# Patient Record
Sex: Male | Born: 1950
Health system: Southern US, Community
[De-identification: ages and names within clinical notes are randomized; demographics above are authoritative.]

## PROBLEM LIST (undated history)

## (undated) DIAGNOSIS — M1990 Unspecified osteoarthritis, unspecified site: Secondary | ICD-10-CM

## (undated) DIAGNOSIS — J45909 Unspecified asthma, uncomplicated: Secondary | ICD-10-CM

## (undated) DIAGNOSIS — Z8489 Family history of other specified conditions: Secondary | ICD-10-CM

## (undated) DIAGNOSIS — H269 Unspecified cataract: Secondary | ICD-10-CM

## (undated) DIAGNOSIS — Z86010 Personal history of colonic polyps: Secondary | ICD-10-CM

## (undated) DIAGNOSIS — Z9289 Personal history of other medical treatment: Secondary | ICD-10-CM

## (undated) DIAGNOSIS — S329XXA Fracture of unspecified parts of lumbosacral spine and pelvis, initial encounter for closed fracture: Secondary | ICD-10-CM

## (undated) DIAGNOSIS — M1612 Unilateral primary osteoarthritis, left hip: Secondary | ICD-10-CM

## (undated) DIAGNOSIS — H409 Unspecified glaucoma: Secondary | ICD-10-CM

## (undated) DIAGNOSIS — J189 Pneumonia, unspecified organism: Secondary | ICD-10-CM

## (undated) HISTORY — DX: Unspecified osteoarthritis, unspecified site: M19.90

## (undated) HISTORY — DX: Unspecified glaucoma: H40.9

## (undated) HISTORY — DX: Unspecified asthma, uncomplicated: J45.909

## (undated) HISTORY — PX: JOINT REPLACEMENT: SHX530

## (undated) HISTORY — DX: Unspecified cataract: H26.9

## (undated) HISTORY — DX: Personal history of colonic polyps: Z86.010

---
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
## 1993-12-29 DIAGNOSIS — Z9289 Personal history of other medical treatment: Secondary | ICD-10-CM

## 1993-12-29 HISTORY — PX: FRACTURE SURGERY: SHX138

## 1993-12-29 HISTORY — DX: Personal history of other medical treatment: Z92.89

## 1993-12-29 HISTORY — PX: PELVIC FRACTURE SURGERY: SHX119

## 1994-09-28 DIAGNOSIS — S329XXA Fracture of unspecified parts of lumbosacral spine and pelvis, initial encounter for closed fracture: Secondary | ICD-10-CM

## 1994-09-28 HISTORY — DX: Fracture of unspecified parts of lumbosacral spine and pelvis, initial encounter for closed fracture: S32.9XXA

## 1998-06-01 ENCOUNTER — Encounter: Admission: RE | Admit: 1998-06-01 | Discharge: 1998-06-01 | Payer: Self-pay | Admitting: *Deleted

## 1999-02-12 ENCOUNTER — Encounter: Payer: Self-pay | Admitting: Emergency Medicine

## 1999-02-12 ENCOUNTER — Emergency Department (HOSPITAL_COMMUNITY): Admission: EM | Admit: 1999-02-12 | Discharge: 1999-02-12 | Payer: Self-pay | Admitting: Emergency Medicine

## 1999-05-23 ENCOUNTER — Emergency Department (HOSPITAL_COMMUNITY): Admission: EM | Admit: 1999-05-23 | Discharge: 1999-05-23 | Payer: Self-pay | Admitting: Emergency Medicine

## 1999-05-23 ENCOUNTER — Encounter: Payer: Self-pay | Admitting: Emergency Medicine

## 2000-02-23 ENCOUNTER — Encounter: Payer: Self-pay | Admitting: Emergency Medicine

## 2000-02-23 ENCOUNTER — Emergency Department (HOSPITAL_COMMUNITY): Admission: EM | Admit: 2000-02-23 | Discharge: 2000-02-23 | Payer: Self-pay | Admitting: Emergency Medicine

## 2000-03-13 ENCOUNTER — Encounter: Admission: RE | Admit: 2000-03-13 | Discharge: 2000-04-16 | Payer: Self-pay | Admitting: *Deleted

## 2004-12-29 HISTORY — PX: HERNIA REPAIR: SHX51

## 2004-12-29 HISTORY — PX: OTHER SURGICAL HISTORY: SHX169

## 2012-01-06 ENCOUNTER — Ambulatory Visit (INDEPENDENT_AMBULATORY_CARE_PROVIDER_SITE_OTHER): Payer: Federal, State, Local not specified - PPO

## 2012-01-06 DIAGNOSIS — N529 Male erectile dysfunction, unspecified: Secondary | ICD-10-CM

## 2012-01-06 DIAGNOSIS — J45901 Unspecified asthma with (acute) exacerbation: Secondary | ICD-10-CM

## 2012-01-06 DIAGNOSIS — M25559 Pain in unspecified hip: Secondary | ICD-10-CM

## 2012-01-06 DIAGNOSIS — M1610 Unilateral primary osteoarthritis, unspecified hip: Secondary | ICD-10-CM

## 2012-02-03 ENCOUNTER — Ambulatory Visit (INDEPENDENT_AMBULATORY_CARE_PROVIDER_SITE_OTHER): Payer: Federal, State, Local not specified - PPO | Admitting: Family Medicine

## 2012-02-03 VITALS — BP 112/72 | HR 68 | Temp 98.4°F | Resp 16 | Ht 72.0 in | Wt 180.0 lb

## 2012-02-03 DIAGNOSIS — J40 Bronchitis, not specified as acute or chronic: Secondary | ICD-10-CM

## 2012-02-03 MED ORDER — PREDNISONE 20 MG PO TABS: ORAL_TABLET | ORAL | Status: AC

## 2012-02-03 MED ORDER — AZITHROMYCIN 250 MG PO TABS: ORAL_TABLET | ORAL | Status: AC

## 2012-02-03 NOTE — Progress Notes (Signed)
  Subjective:    Patient ID: Douglas Russell, male    DOB: [birth year], 61 y.o.   MRN: [id number]  HPI 61 yo male here with URI complaints.  "Flu" January 20-25th.  Still has cough, sore throat, subjective fever off and on.  Cough occasionally productive of yellowish mucus.  No nasal congestion or ear pain.  Slight runny nose.  Using dayquil/nyquil for symptoms.  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    Review of Systems Negative except as per HPI     Objective:   Physical Exam  Constitutional: He appears well-developed. No distress.  HENT:  Right Ear: Tympanic membrane, external ear and ear canal normal. Tympanic membrane is not injected, not scarred, not perforated, not erythematous, not retracted and not bulging.  Left Ear: Tympanic membrane, external ear and ear canal normal. Tympanic membrane is not injected, not scarred, not perforated, not erythematous, not retracted and not bulging.  Nose: No mucosal edema or rhinorrhea. Right sinus exhibits no maxillary sinus tenderness and no frontal sinus tenderness. Left sinus exhibits no maxillary sinus tenderness and no frontal sinus tenderness.  Mouth/Throat: Uvula is midline, oropharynx is clear and moist and mucous membranes are normal. No oropharyngeal exudate or tonsillar abscesses.  Cardiovascular: Normal rate, regular rhythm, normal heart sounds and intact distal pulses.   No murmur heard. Pulmonary/Chest: Effort normal. No respiratory distress. He has wheezes. He has no rales.  Lymphadenopathy:       Head (right side): No submandibular and no preauricular adenopathy present.       Head (left side): No submandibular and no preauricular adenopathy present.       Right cervical: No superficial cervical and no posterior cervical adenopathy present.      Left cervical: No superficial cervical and no posterior cervical adenopathy present.       Right: No supraclavicular adenopathy present.       Left: No supraclavicular adenopathy  present.  Skin: Skin is warm and dry.          Assessment & Plan:  Post viral bronchitis Zpak, pred taper

## 2012-03-16 ENCOUNTER — Encounter (HOSPITAL_COMMUNITY): Payer: Self-pay | Admitting: Emergency Medicine

## 2012-03-16 ENCOUNTER — Emergency Department (HOSPITAL_COMMUNITY)
Admission: EM | Admit: 2012-03-16 | Discharge: 2012-03-16 | Disposition: A | Discharge status: Home / Self Care | Payer: Federal, State, Local not specified - PPO | Attending: Emergency Medicine | Admitting: Emergency Medicine

## 2012-03-16 ENCOUNTER — Emergency Department (HOSPITAL_COMMUNITY): Payer: Federal, State, Local not specified - PPO

## 2012-03-16 ENCOUNTER — Other Ambulatory Visit: Payer: Self-pay

## 2012-03-16 DIAGNOSIS — R0789 Other chest pain: Secondary | ICD-10-CM | POA: Insufficient documentation

## 2012-03-16 DIAGNOSIS — J45909 Unspecified asthma, uncomplicated: Secondary | ICD-10-CM | POA: Insufficient documentation

## 2012-03-16 LAB — CBC
Hemoglobin: 14.7 g/dL (ref 13.0–17.0)
MCHC: 34.6 g/dL (ref 30.0–36.0)
RBC: 4.9 MIL/uL (ref 4.22–5.81)

## 2012-03-16 LAB — POCT I-STAT TROPONIN I
Troponin i, poc: 0 ng/mL (ref 0.00–0.08)
Troponin i, poc: 0 ng/mL (ref 0.00–0.08)

## 2012-03-16 LAB — BASIC METABOLIC PANEL
GFR calc non Af Amer: 89 mL/min — ABNORMAL LOW (ref >90)
Glucose, Bld: 94 mg/dL (ref 70–99)
Potassium: 4.1 mEq/L (ref 3.5–5.1)
Sodium: 137 mEq/L (ref 135–145)

## 2012-03-16 MED ORDER — OXYCODONE-ACETAMINOPHEN 5-325 MG PO TABS: 1.0000 | ORAL_TABLET | Freq: Four times a day (QID) | ORAL | Status: AC | PRN

## 2012-03-16 MED ORDER — OXYCODONE-ACETAMINOPHEN 5-325 MG PO TABS
1.0000 | ORAL_TABLET | Freq: Once | ORAL | Status: AC
  Administered 2012-03-16: 1 via ORAL
  Filled 2012-03-16: qty 1

## 2012-03-16 MED ORDER — IBUPROFEN 600 MG PO TABS: 600.0000 mg | ORAL_TABLET | Freq: Four times a day (QID) | ORAL | Status: AC | PRN

## 2012-03-16 NOTE — ED Notes (Signed)
Pt c/o (L) side chest soreness, tender to touch, pt states "I think it's just the muscles in my chest and Dr. Karma Ganja feels like it's the muscles in my chest. Pt reports increase pain w/chest expansion or arm movements. Pt reports the soreness started after am work out on [employer]. 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

## 2012-03-16 NOTE — ED Provider Notes (Signed)
History     CSN: [id number]  Arrival date & time 03/16/12  1622   First MD Initiated Contact with Patient 03/16/12 2026      Chief Complaint  Patient presents with  . Chest Pain    (Consider location/radiation/quality/duration/timing/severity/associated sxs/prior treatment) HPI Patient presents with complaint of pain in his left anterior chest wall. He states that his pain began earlier today. It is worse with movement of his arm and with turning his head left and right. He states that he does abdominal crunches and Boflex exercises everyday but has not changed his regimen. He denies any shortness of breath no diaphoresis no nausea no radiation of the pain. He's had no fever or cough. His pain is not worsened with exertion. It is only present with movement and palpation.  He has not taken any medications for her symptoms prior to ED evaluation.  There are no other associated systemic symptoms, there are no alleviating or modifying factors.   Past Medical History  Diagnosis Date  . Asthma     Past Surgical History  Procedure Date  . Pelvic fracture surgery 1995  . Nose surgery     No family history on file.  History  Substance Use Topics  . Smoking status: Never Smoker   . Smokeless tobacco: Not on file  . Alcohol Use: Yes     occasional glass of wine      Review of Systems ROS reviewed and otherwise negative except for mentioned in HPI  Allergies  Review of patient's allergies indicates no known allergies.  Home Medications   Current Outpatient Rx  Name Route Sig Dispense Refill  . ASPIRIN EC 81 MG PO TBEC Oral Take 81 mg by mouth daily.    . OMEGA-3 FATTY ACIDS 1000 MG PO CAPS Oral Take 1 g by mouth daily.    Marland Kitchen FLUTICASONE-SALMETEROL 250-50 MCG/DOSE IN AEPB Inhalation Inhale 1 puff into the lungs every 12 (twelve) hours.    . ADULT MULTIVITAMIN W/MINERALS CH Oral Take 1 tablet by mouth daily.    . SAW PALMETTO (SERENOA REPENS) 1000 MG PO CAPS Oral Take 1  capsule by mouth.    Marland Kitchen SILDENAFIL CITRATE 25 MG PO TABS Oral Take 25 mg by mouth daily as needed. For erectile dysfunction    . TRAVOPROST (BAK FREE) 0.004 % OP SOLN Both Eyes Place 1 drop into both eyes at bedtime.     Marland Kitchen ZOLPIDEM TARTRATE ER 12.5 MG PO TBCR Oral Take 12.5 mg by mouth at bedtime as needed. For sleep    . IBUPROFEN 600 MG PO TABS Oral Take 1 tablet (600 mg total) by mouth every 6 (six) hours as needed for pain. 30 tablet 0  . OXYCODONE-ACETAMINOPHEN 5-325 MG PO TABS Oral Take 1-2 tablets by mouth every 6 (six) hours as needed for pain. 15 tablet 0    BP 123/70  Pulse 56  Temp(Src) 97.9 F (36.6 C) (Oral)  Resp 14  SpO2 99% Vitals reviewed Physical Exam Physical Examination: General appearance - alert, well appearing, and in no distress Mental status - alert, oriented to person, place, and time Mouth - mucous membranes moist, pharynx normal without lesions Neck- no midline tenderness to palpation of c/t/l spine Chest - clear to auscultation, no wheezes, rales or rhonchi, symmetric air entry, ttp over left anterior chest wall- worse at sternal junction Heart - normal rate, regular rhythm, normal S1, S2, no murmurs, rubs, clicks or gallops Abdomen - soft, nontender, nondistended, no masses or  organomegaly Musculoskeletal - no joint tenderness, deformity or swelling, FROM of shoulder Extremities - peripheral pulses normal, no pedal edema, no clubbing or cyanosis Skin - normal coloration and turgor, no rashes  ED Course  Procedures (including critical care time)  Labs Reviewed  BASIC METABOLIC PANEL - Abnormal; Notable for the following:    GFR calc non Af Amer 89 (*)    All other components within normal limits  CBC  POCT I-STAT TROPONIN I  POCT I-STAT TROPONIN I   Dg Chest 2 View  03/16/2012  *RADIOLOGY REPORT*  Clinical Data:  chest pain  CHEST - 2 VIEW  Comparison: None  Findings: The heart size and mediastinal contours are within normal limits.  Both lungs are  clear.  The visualized skeletal structures are unremarkable.  IMPRESSION: Negative exam.  Original Report Authenticated By: Rosealee Albee, M.D.     1. Chest wall pain       MDM  Patient presents with left-sided chest pain that is only present with movement of his arm and palpation of his chest wall. The pain is reproducible upon palpation during my evaluation. He has no risk factors and low suspicion for acute coronary syndrome especially given 2 troponins that were negative today in the emergency department. Patient was advised to take anti-inflammatories and was given strict return precautions. He is agreeable with this plan and will arrange for followup with his primary care Dr.        Ethelda Chick, MD 03/16/12 2322

## 2012-03-16 NOTE — ED Notes (Signed)
Pt st's earlier today he started having pain in left chest and under left arm.  St's only had pain when he would turn his body or head.  Pt has been using BoFlex exercises.  Denies any other symptoms

## 2012-03-16 NOTE — Discharge Instructions (Signed)
Return to the ED with any concerns including difficulty breathing, worsening pain, nausea or sweating associated with your pain, swelling of your legs, fainting, or any other alarming symptoms.

## 2012-09-28 ENCOUNTER — Ambulatory Visit (INDEPENDENT_AMBULATORY_CARE_PROVIDER_SITE_OTHER): Payer: Federal, State, Local not specified - PPO

## 2012-09-28 DIAGNOSIS — Z23 Encounter for immunization: Secondary | ICD-10-CM

## 2012-09-28 NOTE — Addendum Note (Signed)
Addended by: Cydney Ok on: 09/28/2012 04:45 PM   Modules accepted: Level of Service

## 2012-10-14 ENCOUNTER — Other Ambulatory Visit: Payer: Self-pay | Admitting: Physician Assistant

## 2012-10-18 ENCOUNTER — Telehealth: Payer: Self-pay

## 2012-10-18 MED ORDER — FLUTICASONE-SALMETEROL 250-50 MCG/DOSE IN AEPB: 1.0000 | INHALATION_SPRAY | Freq: Two times a day (BID) | RESPIRATORY_TRACT | Status: DC

## 2012-10-18 NOTE — Telephone Encounter (Signed)
He uses [facility]. He now states he does not need the Albuterol inhaler. He needs the Advair, discussed with Benny Lennert and okay to send in.  Sent in error to [facility] this sent to mail order.

## 2012-10-18 NOTE — Telephone Encounter (Signed)
Pt would like to talk to someone regarding his mail order rx best number to call is [phone]

## 2012-10-18 NOTE — Telephone Encounter (Signed)
I have pended the Rx - I did not know what mail order he wanted - he is still taking his advair correct?

## 2012-10-18 NOTE — Telephone Encounter (Signed)
I called patient, he states the mail order pharmacy has ? About his albuterol inhaler, the dosage. I asked why he is just now getting the Rx, (he states was written in Feb) he states his previous Rx has just now ran out, and he would like renewal, he is advised he may need to come back to clinic for this, he states he is well, and needs the inhaler only, would like this sent in without visit. Please advise. I will pull chart. [id number]

## 2012-10-21 ENCOUNTER — Telehealth: Payer: Self-pay | Admitting: *Deleted

## 2012-10-21 MED ORDER — FLUTICASONE-SALMETEROL 250-50 MCG/DOSE IN AEPB: 1.0000 | INHALATION_SPRAY | Freq: Two times a day (BID) | RESPIRATORY_TRACT | Status: DC

## 2012-10-21 NOTE — Telephone Encounter (Signed)
No notes

## 2013-01-11 ENCOUNTER — Ambulatory Visit (INDEPENDENT_AMBULATORY_CARE_PROVIDER_SITE_OTHER): Payer: Federal, State, Local not specified - PPO | Admitting: Family Medicine

## 2013-01-11 VITALS — BP 113/73 | HR 71 | Temp 98.0°F | Resp 16 | Ht 71.25 in | Wt 184.0 lb

## 2013-01-11 DIAGNOSIS — Z125 Encounter for screening for malignant neoplasm of prostate: Secondary | ICD-10-CM

## 2013-01-11 DIAGNOSIS — J449 Chronic obstructive pulmonary disease, unspecified: Secondary | ICD-10-CM

## 2013-01-11 DIAGNOSIS — N529 Male erectile dysfunction, unspecified: Secondary | ICD-10-CM

## 2013-01-11 DIAGNOSIS — Z202 Contact with and (suspected) exposure to infections with a predominantly sexual mode of transmission: Secondary | ICD-10-CM

## 2013-01-11 DIAGNOSIS — Z2089 Contact with and (suspected) exposure to other communicable diseases: Secondary | ICD-10-CM

## 2013-01-11 DIAGNOSIS — L219 Seborrheic dermatitis, unspecified: Secondary | ICD-10-CM

## 2013-01-11 MED ORDER — FLUTICASONE-SALMETEROL 250-50 MCG/DOSE IN AEPB: 1.0000 | INHALATION_SPRAY | Freq: Two times a day (BID) | RESPIRATORY_TRACT | Status: DC

## 2013-01-11 MED ORDER — SILDENAFIL CITRATE 25 MG PO TABS: 50.0000 mg | ORAL_TABLET | Freq: Every day | ORAL | Status: DC | PRN

## 2013-01-11 MED ORDER — FLUOCINOLONE ACETONIDE 0.01 % EX SOLN: Freq: Two times a day (BID) | CUTANEOUS | Status: DC

## 2013-01-11 NOTE — Patient Instructions (Signed)
Return in 1 year or as needed 

## 2013-01-11 NOTE — Progress Notes (Signed)
Subjective: Patient is here for his regular annual prostate check. He also wants to be STD tested. His girlfriend requests that. 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 He has no major problems or complaints. He feels well. Review of systems including HEENT, respiratory, cardiovascular, GI, GU, musculoskeletal all unremarkable. He does have some his COPD but he uses breathing medicine and is exercising on exercise equipment regularly. He just got a new treadmill.  Objective: No complaints digital rectal exam was done and was normal prostate gland is very small. Normal male external genitalia with no lesions noted testes descended no hernias.  Assessment: PSA screen COPD Seborrheic dermatitis STD exposure risk  Plan: Prescribe his medications. Daily STD testing and PSA done before he leaves.

## 2013-01-13 LAB — HSV(HERPES SIMPLEX VRS) I + II AB-IGG
HSV 1 Glycoprotein G Ab, IgG: 16.14 IV — ABNORMAL HIGH
HSV 2 Glycoprotein G Ab, IgG: 4.85 IV — ABNORMAL HIGH

## 2013-01-13 LAB — GC/CHLAMYDIA PROBE AMP, URINE: Chlamydia, Swab/Urine, PCR: NEGATIVE

## 2013-01-17 ENCOUNTER — Other Ambulatory Visit: Payer: Self-pay | Admitting: *Deleted

## 2013-01-17 DIAGNOSIS — L219 Seborrheic dermatitis, unspecified: Secondary | ICD-10-CM

## 2013-01-17 MED ORDER — FLUOCINOLONE ACETONIDE 0.01 % EX SOLN: Freq: Two times a day (BID) | CUTANEOUS | Status: DC

## 2013-06-16 ENCOUNTER — Ambulatory Visit (INDEPENDENT_AMBULATORY_CARE_PROVIDER_SITE_OTHER): Payer: Federal, State, Local not specified - PPO | Admitting: Family Medicine

## 2013-06-16 VITALS — BP 118/76 | HR 86 | Temp 98.0°F | Resp 16 | Ht 72.0 in | Wt 186.0 lb

## 2013-06-16 DIAGNOSIS — N400 Enlarged prostate without lower urinary tract symptoms: Secondary | ICD-10-CM

## 2013-06-16 DIAGNOSIS — F411 Generalized anxiety disorder: Secondary | ICD-10-CM

## 2013-06-16 DIAGNOSIS — G8929 Other chronic pain: Secondary | ICD-10-CM

## 2013-06-16 MED ORDER — CLONAZEPAM 0.5 MG PO TABS: 0.5000 mg | ORAL_TABLET | Freq: Two times a day (BID) | ORAL | Status: DC | PRN

## 2013-06-16 MED ORDER — SERTRALINE HCL 50 MG PO TABS: 50.0000 mg | ORAL_TABLET | Freq: Every day | ORAL | Status: DC

## 2013-06-16 NOTE — Patient Instructions (Signed)
Continue seeing your psychologist.  Return in 3 weeks for followup. Call the office to find out what Dr. Alwyn Ren are at the time.  Begin sertraline 50 mg one each morning.  Take clonazepam one daily only if extremely anxious.
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
## 2013-06-24 ENCOUNTER — Telehealth: Payer: Self-pay

## 2013-06-24 DIAGNOSIS — F411 Generalized anxiety disorder: Secondary | ICD-10-CM

## 2013-06-24 MED ORDER — SERTRALINE HCL 50 MG PO TABS: 50.0000 mg | ORAL_TABLET | Freq: Every day | ORAL | Status: DC

## 2013-06-24 NOTE — Telephone Encounter (Signed)
[facility] states [facility] needs additional information to fill his zoloft request   [phone]

## 2013-06-24 NOTE — Telephone Encounter (Signed)
Sent through for 90 day supply this is what is needed.

## 2013-07-08 ENCOUNTER — Telehealth: Payer: Self-pay | Admitting: Radiology

## 2013-07-08 ENCOUNTER — Ambulatory Visit (INDEPENDENT_AMBULATORY_CARE_PROVIDER_SITE_OTHER): Payer: Federal, State, Local not specified - PPO | Admitting: Family Medicine

## 2013-07-08 VITALS — BP 120/76 | HR 68 | Temp 97.7°F | Resp 16 | Ht 72.5 in | Wt 184.0 lb

## 2013-07-08 DIAGNOSIS — B009 Herpesviral infection, unspecified: Secondary | ICD-10-CM

## 2013-07-08 DIAGNOSIS — F411 Generalized anxiety disorder: Secondary | ICD-10-CM

## 2013-07-08 DIAGNOSIS — G4700 Insomnia, unspecified: Secondary | ICD-10-CM

## 2013-07-08 MED ORDER — VALACYCLOVIR HCL 1 G PO TABS: ORAL_TABLET | ORAL | Status: DC

## 2013-07-08 NOTE — Telephone Encounter (Signed)
I did not give long term rx.  Tell him to read the AVS which explains the prn use if he has an attack.  We discussed this.

## 2013-07-08 NOTE — Progress Notes (Signed)
Subjective: Patient is here complaining of not being able to sleep. He periodically sees a sleep specialist in [place]. He has had a couple sleep studies done. They apparently have him on Rozerem and Ambien. He is very anxious. The HSV 1 and HSV-2 make him very anxious. He is scared to take the medications I prescribed, sertraline and clonazepam, for fear of him a side effect. Good long discussion about that. He does try and get exercise. He drinks some green tea in the morning.  Objective: Did not examine him today  Assessment: Anxiety Sleep disturbance History of HSV 1 and HSV-2  Plan: Valtrex for when necessary use. Continue same medications. Referred to a psychiatrist for the anxiety. See a sleep study specialist back to discuss anything as it can be done for her sleep. Had long discussion with him.

## 2013-07-08 NOTE — Patient Instructions (Signed)
See your sleep Dr. back and asked them if there is not some other option for you.  We are making referral to a psychiatrist for you. They may help you sleep some also.  Use the valacyclovir when needed to 4 outbreaks of HSV 1 or HSV-2  Continue to try and get regular exercise  Give the sertraline a little bit more time to start easing your symptoms. Save the clonazepam for days in your just extremely anxious.  Return in September for recheck, sooner if problems

## 2013-07-08 NOTE — Telephone Encounter (Signed)
Patient needs 60 day Rx for mail order do you want to change quantity on the Valtrex? Or do you expect #20 will be sufficient for 60 days?

## 2013-07-09 NOTE — Telephone Encounter (Signed)
Patient is wanting to cancel his referral to psychiatrist and try the medicine you gave him.

## 2013-07-11 NOTE — Telephone Encounter (Signed)
Noted, thanks. Patient advised he should see Psychiatrist, this will not be a long term medication. Douglas Russell

## 2013-07-21 ENCOUNTER — Telehealth: Payer: Self-pay

## 2013-07-21 DIAGNOSIS — B009 Herpesviral infection, unspecified: Secondary | ICD-10-CM

## 2013-07-21 NOTE — Telephone Encounter (Signed)
Ok to change to 90 day supply

## 2013-07-21 NOTE — Telephone Encounter (Signed)
Please advise in increased quantity on the Valtrex.

## 2013-07-21 NOTE — Telephone Encounter (Signed)
Patient takes prn flare depending on if he has flare of type one or type two, how many do you think he may need for the 90 day supply? This sig is a bit different, I pended for #60 instead of #20, do you think this is adequate?

## 2013-07-21 NOTE — Telephone Encounter (Signed)
Pt is calling because Dr Alwyn Ren wrote him a prescription for valtrax and his insurance will only pay for 90 day supply  But dr hopper only wrote it for less than 60. The pt was wanting to know if it could be changed He gave me a reference number for the insurance [id number] Call back number is [phone]

## 2013-07-22 ENCOUNTER — Telehealth: Payer: Self-pay | Admitting: Radiology

## 2013-07-22 MED ORDER — VALACYCLOVIR HCL 500 MG PO TABS: 500.0000 mg | ORAL_TABLET | Freq: Two times a day (BID) | ORAL | Status: DC

## 2013-07-22 NOTE — Telephone Encounter (Signed)
I called and LMOM for patient to return call.  I would like to talk to him regarding how he would like to take the medication before I send it to the pharmacy.  Would he like to take daily or prn is what I need to do.

## 2013-07-22 NOTE — Telephone Encounter (Signed)
Pt had 1 outbreak in May -- he would like to take meds as needed for outbreaks.  He will call if he changes his mind and wants to take daily.

## 2013-07-22 NOTE — Telephone Encounter (Signed)
I spoke with pt and advised him that he will that may want to take for suppression daily because he will possibly have less out breakouts to none.  His outbreaks occurs in the genital area.  He also stated that if his rx is written for less than a 90 day supply it will charge him a higher copay.

## 2013-07-22 NOTE — Telephone Encounter (Signed)
Spoke to Benny Lennert about patient, if he calls back, I need to speak to him regarding a Rx.

## 2013-08-02 ENCOUNTER — Telehealth: Payer: Self-pay

## 2013-08-02 NOTE — Telephone Encounter (Addendum)
PT WOULD LIKE TO SPEAK WITH SOMEONE REGARDING HIS MEDICATION. IS AFRAID HE MAY NOT BE TAKING IT CORRECTLY SINCE HE IS ON [facility] AND [employer] PLEASE CALL [phone]

## 2013-08-02 NOTE — Telephone Encounter (Signed)
Called pt he states he takes Sertraline 8 am and Ambien at bedtime he states he feels drowsy during the day indicates this may be related to his taking the Sertraline, he is asking if he should take this at a later time. I have advised him per Benny Lennert to take this in the evening and see if helpful, if not may be related to the Ambien.

## 2013-09-06 ENCOUNTER — Ambulatory Visit (INDEPENDENT_AMBULATORY_CARE_PROVIDER_SITE_OTHER): Payer: Federal, State, Local not specified - PPO | Admitting: Family Medicine

## 2013-09-06 VITALS — BP 110/70 | HR 68 | Temp 98.7°F | Resp 18 | Ht 71.0 in | Wt 179.6 lb

## 2013-09-06 DIAGNOSIS — M7989 Other specified soft tissue disorders: Secondary | ICD-10-CM

## 2013-09-06 DIAGNOSIS — F4323 Adjustment disorder with mixed anxiety and depressed mood: Secondary | ICD-10-CM

## 2013-09-06 DIAGNOSIS — R202 Paresthesia of skin: Secondary | ICD-10-CM

## 2013-09-06 DIAGNOSIS — R209 Unspecified disturbances of skin sensation: Secondary | ICD-10-CM

## 2013-09-06 NOTE — Patient Instructions (Addendum)
Decrease the Zoloft (sertraline) to 25 mg daily (one half of 50 mg)  Keep the followup appointment with Dr.Plovsky  Return in about 2 months  Try to avoid sleeping on left side. If swelling gets worse let me know.

## 2013-09-06 NOTE — Progress Notes (Signed)
Subjective: Patient is here today for followup regular visit. He complains of having a lot of symptoms on the left side of his body. He's had some numbness sensation in his face and chest wall. He has a sensation of swelling in his left chest. His left arm is swollen he says that down into his hand. He does walk do some physical working out with some weights. He does try to eat all the serotonin foods that he can. He feels like his symptoms and been brought on by the side effects of sertraline. He did see Dr.Plotsky to talk over an hour but did not make any recommendations and scheduled him come back in October. No other major symptoms and has not needed a refill of his medications. He says he is sleeping satisfactorily and is nerves are doing okay.  Objective: HEENT eyes are PERRLA. Throat clear. TMs normal. No carotid bruits. Chest clear. Heart regular without murmurs. Motor strength is symmetrical. Full range of motion of his left arm. The pain did not look visibly swollen at this time in the wrist watch seemed comfortable. Good radial pulses.  Assessment: Anxiety disorder Paresthesias History left arm swelling  Plan: I really believe this is primarily anxiety related. Did go ahead and cut his sertraline back from 50-25 mg. Keep his appointment with the psychiatrist to see me back in 2 months, sooner if needed. I do not have anything else major today.

## 2013-09-23 ENCOUNTER — Ambulatory Visit (INDEPENDENT_AMBULATORY_CARE_PROVIDER_SITE_OTHER): Payer: Federal, State, Local not specified - PPO | Admitting: Family Medicine

## 2013-09-23 VITALS — BP 110/68 | HR 70 | Temp 98.8°F | Resp 16 | Ht 71.0 in | Wt 180.6 lb

## 2013-09-23 DIAGNOSIS — F411 Generalized anxiety disorder: Secondary | ICD-10-CM

## 2013-09-23 DIAGNOSIS — Z23 Encounter for immunization: Secondary | ICD-10-CM

## 2013-09-23 DIAGNOSIS — Z289 Immunization not carried out for unspecified reason: Secondary | ICD-10-CM

## 2013-09-23 NOTE — Patient Instructions (Signed)
Return in 3-4 months

## 2013-09-23 NOTE — Progress Notes (Signed)
Subjective: 62 year old man well-known to me who is here for a revisit regarding immunization update. He got the message through my chart that his tetanus shot was due. He also is due for his flu shot. He has been in the process of setting up for retirement and Social Security and [employer]. He is doing well, seems to have his anxiety doing much better.  Objective: No carotid bruits. Chest clear. Heart regular.  Assessment: Immunization update for influenza and TDAP Anxiety stable   Plan  Return in 3 or 4 months, similar problems  Review his old record. No record of a Pneumovax. Will go ahead and give him his Pneumovax today also.  He has had a shingles vaccine according to him at the pharmacy.

## 2014-02-24 ENCOUNTER — Ambulatory Visit (INDEPENDENT_AMBULATORY_CARE_PROVIDER_SITE_OTHER): Payer: Medicare Other | Admitting: Family Medicine

## 2014-02-24 VITALS — BP 124/76 | HR 83 | Temp 98.4°F | Resp 17 | Ht 71.0 in | Wt 186.0 lb

## 2014-02-24 DIAGNOSIS — H4011X0 Primary open-angle glaucoma, stage unspecified: Secondary | ICD-10-CM | POA: Diagnosis not present

## 2014-02-24 DIAGNOSIS — J449 Chronic obstructive pulmonary disease, unspecified: Secondary | ICD-10-CM

## 2014-02-24 DIAGNOSIS — B009 Herpesviral infection, unspecified: Secondary | ICD-10-CM

## 2014-02-24 DIAGNOSIS — B351 Tinea unguium: Secondary | ICD-10-CM

## 2014-02-24 DIAGNOSIS — N529 Male erectile dysfunction, unspecified: Secondary | ICD-10-CM

## 2014-02-24 LAB — COMPREHENSIVE METABOLIC PANEL
ALK PHOS: 79 U/L (ref 39–117)
ALT: 26 U/L (ref 0–53)
AST: 24 U/L (ref 0–37)
Albumin: 4.2 g/dL (ref 3.5–5.2)
BILIRUBIN TOTAL: 0.8 mg/dL (ref 0.2–1.2)
BUN: 16 mg/dL (ref 6–23)
CO2: 30 meq/L (ref 19–32)
CREATININE: 0.91 mg/dL (ref 0.50–1.35)
Calcium: 9.4 mg/dL (ref 8.4–10.5)
Chloride: 102 mEq/L (ref 96–112)
GLUCOSE: 85 mg/dL (ref 70–99)
Potassium: 4.5 mEq/L (ref 3.5–5.3)
Sodium: 137 mEq/L (ref 135–145)
Total Protein: 6.9 g/dL (ref 6.0–8.3)

## 2014-02-24 MED ORDER — VALACYCLOVIR HCL 500 MG PO TABS: 500.0000 mg | ORAL_TABLET | Freq: Two times a day (BID) | ORAL | Status: DC

## 2014-02-24 MED ORDER — TERBINAFINE HCL 250 MG PO TABS: 250.0000 mg | ORAL_TABLET | Freq: Every day | ORAL | Status: DC

## 2014-02-24 MED ORDER — FLUTICASONE-SALMETEROL 250-50 MCG/DOSE IN AEPB: 1.0000 | INHALATION_SPRAY | Freq: Two times a day (BID) | RESPIRATORY_TRACT | Status: DC

## 2014-02-24 MED ORDER — SILDENAFIL CITRATE 25 MG PO TABS: 50.0000 mg | ORAL_TABLET | Freq: Every day | ORAL | Status: DC | PRN

## 2014-02-24 NOTE — Patient Instructions (Signed)
Continue current medications  Began taking terbinafine one daily for the toenails. After one month return for lab recheck his.

## 2014-02-24 NOTE — Progress Notes (Signed)
Subjective: 63 year old man who is well-known to me. Patient is here for a annual check. He says he has sleep disturbance and is now taking some herbal medication. He doesn't think it helps a lot either. He's tried several prescriptions without relief. He is a little hesitant about all medications. He does have fungus of his toenails and he would like them treated. He has arthritis problems in his left hip. No other major acute complaints.  Objective: Pleasant gentleman. Throat clear. TMs normal but has a moderate amount of wax in the lower part of both canals. Neck supple without nodes. Chest is clear to auscultation. Heart rate without murmurs. And soft without mass or tenderness. Walks with a limp. Digital rectal exam his prostate being large normal. He has nocturia 2-3 times per night. Feet have dry skin. He has fungus of several of the toenails on the right foot.  Assessment: Onychomycosis Mild BPH Sleep disturbance ED History of herpes  Plan: Refill his medications Discussed the prostate. At this point I would not use some medications for sleep because I do not want to make him have more urinary retention problems. If he gets too much trouble can refer him to a urologist. Also if his hip bothers him too much consider bacteria orthopedist to consider a total hip. He does not feel like he is ready for that yet. Return in one month to recheck liver enzymes for the Lamisil. He understands the need for that.

## 2014-02-25 ENCOUNTER — Encounter: Payer: Self-pay | Admitting: *Deleted

## 2014-04-05 ENCOUNTER — Ambulatory Visit (INDEPENDENT_AMBULATORY_CARE_PROVIDER_SITE_OTHER): Payer: Medicare Other | Admitting: Family Medicine

## 2014-04-05 VITALS — BP 118/78 | HR 81 | Temp 98.0°F | Resp 16 | Ht 72.25 in | Wt 186.4 lb

## 2014-04-05 DIAGNOSIS — Z1211 Encounter for screening for malignant neoplasm of colon: Secondary | ICD-10-CM

## 2014-04-05 DIAGNOSIS — Z5181 Encounter for therapeutic drug level monitoring: Secondary | ICD-10-CM | POA: Diagnosis not present

## 2014-04-05 DIAGNOSIS — B351 Tinea unguium: Secondary | ICD-10-CM

## 2014-04-05 NOTE — Patient Instructions (Signed)
Someone should let you know in the next few days about the colonoscopy referral.  Continue the Lamisil for 2 more months  Return if problems

## 2014-04-05 NOTE — Progress Notes (Signed)
Subjective: Patient is here for a recheck of his toenails and to check the liver enzyme test before continuing on the Lamisil. He has had it for a month.  Also he needs a referral for his colonoscopy which is due next month.  Objective: The nails of his right foot looks like they are beginning to grow a normal band of nail right at the base of the old nail.  Assessment: onyuchoycosis, improving, for lab testing Referral for colonoscopy  Plan: Lamisil 200 mg one daily for 60 more days GI referral

## 2014-04-06 LAB — COMPREHENSIVE METABOLIC PANEL
ALBUMIN: 4.1 g/dL (ref 3.5–5.2)
ALT: 28 U/L (ref 0–53)
AST: 27 U/L (ref 0–37)
Alkaline Phosphatase: 79 U/L (ref 39–117)
BUN: 19 mg/dL (ref 6–23)
CHLORIDE: 105 meq/L (ref 96–112)
CO2: 25 mEq/L (ref 19–32)
Calcium: 9.3 mg/dL (ref 8.4–10.5)
Creat: 0.88 mg/dL (ref 0.50–1.35)
Glucose, Bld: 92 mg/dL (ref 70–99)
POTASSIUM: 4.1 meq/L (ref 3.5–5.3)
Sodium: 138 mEq/L (ref 135–145)
Total Bilirubin: 0.6 mg/dL (ref 0.2–1.2)
Total Protein: 7 g/dL (ref 6.0–8.3)

## 2014-05-08 ENCOUNTER — Encounter: Payer: Self-pay | Admitting: Family Medicine

## 2014-06-21 DIAGNOSIS — H4011X0 Primary open-angle glaucoma, stage unspecified: Secondary | ICD-10-CM | POA: Diagnosis not present

## 2014-07-26 ENCOUNTER — Ambulatory Visit (INDEPENDENT_AMBULATORY_CARE_PROVIDER_SITE_OTHER): Payer: Medicare Other | Admitting: Family Medicine

## 2014-07-26 ENCOUNTER — Ambulatory Visit (INDEPENDENT_AMBULATORY_CARE_PROVIDER_SITE_OTHER): Payer: Medicare Other

## 2014-07-26 VITALS — BP 112/68 | HR 84 | Temp 97.3°F | Resp 16 | Ht 72.0 in | Wt 177.6 lb

## 2014-07-26 DIAGNOSIS — M1612 Unilateral primary osteoarthritis, left hip: Secondary | ICD-10-CM

## 2014-07-26 DIAGNOSIS — M25559 Pain in unspecified hip: Secondary | ICD-10-CM

## 2014-07-26 DIAGNOSIS — M1610 Unilateral primary osteoarthritis, unspecified hip: Secondary | ICD-10-CM | POA: Diagnosis not present

## 2014-07-26 DIAGNOSIS — M25552 Pain in left hip: Secondary | ICD-10-CM

## 2014-07-26 NOTE — Patient Instructions (Signed)
Referral is being made to orthopedics.  Advise total hip.  Tylenol for pain.

## 2014-07-26 NOTE — Progress Notes (Signed)
Subjective: Patient has been doing fairly well except for his left hip which continues to hurt him a lot. He was told couple of years ago by an orthopedist that he might need a hip replacement. He has not had x-rays sits. It hurts him when he stands on it, when he sits on it too long, when he gets in and out of the car, and frequent other times. No new injuries. He hurts in the back of the hip and down in the groin region.  He has a history of pelvic fracture 20 years ago.  Objective: Abdomen soft. Very limited range of motion of his left hip, causes pain. No ankle edema. He does have a little DJD D. and hypertrophy of his left knee also probably.  [facility] reading (PRIMARY) by  Dr. Linna Darner Severe advanced DJD left hip.  Old screw in right pelvis.   Assessment: Severe degenerative joint disease left hip  Plan: See orthopedist for total hip is my opinion. Medications probably won't help him a lot.

## 2014-07-28 ENCOUNTER — Encounter: Payer: Self-pay | Admitting: Family Medicine

## 2014-07-29 ENCOUNTER — Other Ambulatory Visit: Payer: Self-pay | Admitting: Family Medicine

## 2014-07-29 DIAGNOSIS — M1610 Unilateral primary osteoarthritis, unspecified hip: Secondary | ICD-10-CM

## 2014-07-29 NOTE — Telephone Encounter (Signed)
SEE EMAIL  Patient came in this morning to try and expediate his request for an earlier appointment for his hip.    [phone]

## 2014-07-29 NOTE — Progress Notes (Signed)
Per patient request will try to refer to Dr. Mardelle Matte

## 2014-08-02 DIAGNOSIS — M1610 Unilateral primary osteoarthritis, unspecified hip: Secondary | ICD-10-CM | POA: Diagnosis not present

## 2014-08-07 ENCOUNTER — Telehealth: Payer: Self-pay

## 2014-08-07 NOTE — Telephone Encounter (Signed)
We received a fax on this pt for a pre operative clearance. Per Dr. Linna Darner, unable to fill this out based off of this pt's last OV. Spoke with pt and he will RTC tomorrow to see Dr. Linna Darner. Form is in nurse's box at TL station.

## 2014-08-08 ENCOUNTER — Ambulatory Visit (INDEPENDENT_AMBULATORY_CARE_PROVIDER_SITE_OTHER): Payer: Medicare Other | Admitting: Family Medicine

## 2014-08-08 VITALS — BP 118/70 | HR 66 | Temp 98.0°F | Resp 16 | Ht 69.75 in | Wt 179.4 lb

## 2014-08-08 DIAGNOSIS — Z8781 Personal history of (healed) traumatic fracture: Secondary | ICD-10-CM | POA: Diagnosis not present

## 2014-08-08 DIAGNOSIS — Z833 Family history of diabetes mellitus: Secondary | ICD-10-CM | POA: Diagnosis not present

## 2014-08-08 DIAGNOSIS — Z8719 Personal history of other diseases of the digestive system: Secondary | ICD-10-CM | POA: Diagnosis not present

## 2014-08-08 DIAGNOSIS — Z8709 Personal history of other diseases of the respiratory system: Secondary | ICD-10-CM | POA: Diagnosis not present

## 2014-08-08 DIAGNOSIS — M1610 Unilateral primary osteoarthritis, unspecified hip: Secondary | ICD-10-CM

## 2014-08-08 DIAGNOSIS — Z01818 Encounter for other preprocedural examination: Secondary | ICD-10-CM | POA: Diagnosis not present

## 2014-08-08 DIAGNOSIS — M1612 Unilateral primary osteoarthritis, left hip: Secondary | ICD-10-CM

## 2014-08-08 DIAGNOSIS — Z96649 Presence of unspecified artificial hip joint: Secondary | ICD-10-CM

## 2014-08-08 LAB — POCT UA - MICROSCOPIC ONLY
BACTERIA, U MICROSCOPIC: NEGATIVE
Casts, Ur, LPF, POC: NEGATIVE
Crystals, Ur, HPF, POC: NEGATIVE
EPITHELIAL CELLS, URINE PER MICROSCOPY: NEGATIVE
RBC, urine, microscopic: NEGATIVE
Yeast, UA: NEGATIVE

## 2014-08-08 LAB — POCT URINALYSIS DIPSTICK
Bilirubin, UA: NEGATIVE
Glucose, UA: NEGATIVE
KETONES UA: NEGATIVE
LEUKOCYTES UA: NEGATIVE
Nitrite, UA: NEGATIVE
Protein, UA: NEGATIVE
SPEC GRAV UA: 1.02
Urobilinogen, UA: 0.2
pH, UA: 7

## 2014-08-08 LAB — POCT CBC
GRANULOCYTE PERCENT: 57.7 % (ref 37–80)
HEMATOCRIT: 44.2 % (ref 43.5–53.7)
Hemoglobin: 14.4 g/dL (ref 14.1–18.1)
Lymph, poc: 1.7 (ref 0.6–3.4)
MCH: 29.1 pg (ref 27–31.2)
MCHC: 32.7 g/dL (ref 31.8–35.4)
MCV: 89.2 fL (ref 80–97)
MID (CBC): 0.4 (ref 0–0.9)
MPV: 7.4 fL (ref 0–99.8)
POC Granulocyte: 2.8 (ref 2–6.9)
POC LYMPH %: 34.3 % (ref 10–50)
POC MID %: 8 %M (ref 0–12)
Platelet Count, POC: 168 10*3/uL (ref 142–424)
RBC: 4.95 M/uL (ref 4.69–6.13)
RDW, POC: 16.2 %
WBC: 4.9 10*3/uL (ref 4.6–10.2)

## 2014-08-08 LAB — POCT GLYCOSYLATED HEMOGLOBIN (HGB A1C): Hemoglobin A1C: 5.5

## 2014-08-08 NOTE — Patient Instructions (Signed)
You are given clearance for your surgery in October. In the event of any acute illnesses or concerns between now and then please get reevaluated.

## 2014-08-08 NOTE — Progress Notes (Signed)
Preop medical examination  History: A form was received from Dr. Marchia Bond requesting preop medical clearance. He is being scheduled in October for a total hip. The patient has no acute medical complaints.  Past history: Surgeries: Pelvic fracture in 1995. In 2006-2007 the patient had problems with a infection in his buttock and a sinus tract over to the opposite buttock. It took a long time to get all of this healed up. Apparently there were some systemic infection with it. Had an umbilical hernia repair. Medical illnesses: History of pulmonary problems, asthma, for which she's been on Advair for a long time. He is stable on that. Has a history of glaucoma for many years Regular medications: Glaucoma drops, Viagra, Advair, multiple supplements  Social history: He's done a number of jobs over the years. 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 He drove a bus. He has worked at [employer] until recently. He has one estranged daughter. Gets some regular exercise. Does not smoke or use drugs. Rarely drinks some wine.  Family history: His mother is living. She takes Coumadin and has had knee replacements His father is deceased from diabetes and pneumonia  Review of systems: Constitutional: Unremarkable HEENT: Glaucoma controlled Cardiovascular: Unremarkable Respiratory: Stable on his Advair Gastrointestinal: Unremarkable Genitourinary:. Nocturia about 3 times. The prostate exams have been normal. Musculoskeletal: Hip pain. Both knees have crepitance. Dermatologic: Unremarkable Neurologic: Unremarkable Psychiatric: Unremarkable Endocrinologic: Unremarkable   Physical exam: Healthy-appearing man who wears glasses. TMs normal except for a little cerumen. Eyes PERRLA. Throat clear. Neck supple without nodes or thyromegaly. No carotid bruits. Chest clear to auscultation. Heart rate without murmurs gallops or arrhythmias. Her saturation was good. Abdomen  was soft without organomegaly mass or tenderness. Extremities has very painful left hip. Otherwise has no crepitance in his knees.  Assessment: Normal preoperative medical clearance History of asthma, stable History of pelvic fracture, remote DJD History of glaucoma  Plan: EKG normal Urinalysis Hemoglobin A1c due to the family history of diabetes Blood chemistries panel was done a few months ago and was normal Less checks x-ray was 2 years ago, did not repeat this Copy of reports to Dr. Mardelle Matte.  Results for orders placed in visit on 08/08/14  POCT CBC      Result Value Ref Range   WBC 4.9  4.6 - 10.2 K/uL   Lymph, poc 1.7  0.6 - 3.4   POC LYMPH PERCENT 34.3  10 - 50 %L   MID (cbc) 0.4  0 - 0.9   POC MID % 8.0  0 - 12 %M   POC Granulocyte 2.8  2 - 6.9   Granulocyte percent 57.7  37 - 80 %G   RBC 4.95  4.69 - 6.13 M/uL   Hemoglobin 14.4  14.1 - 18.1 g/dL   HCT, POC 44.2  43.5 - 53.7 %   MCV 89.2  80 - 97 fL   MCH, POC 29.1  27 - 31.2 pg   MCHC 32.7  31.8 - 35.4 g/dL   RDW, POC 16.2     Platelet Count, POC 168  142 - 424 K/uL   MPV 7.4  0 - 99.8 fL  POCT GLYCOSYLATED HEMOGLOBIN (HGB A1C)      Result Value Ref Range   Hemoglobin A1C 5.5    POCT URINALYSIS DIPSTICK      Result Value Ref Range   Color, UA yellow     Clarity, UA clear  Glucose, UA neg     Bilirubin, UA neg     Ketones, UA neg     Spec Grav, UA 1.020     Blood, UA trace     pH, UA 7.0     Protein, UA neg     Urobilinogen, UA 0.2     Nitrite, UA neg     Leukocytes, UA Negative    POCT UA - MICROSCOPIC ONLY      Result Value Ref Range   WBC, Ur, HPF, POC 0-4     RBC, urine, microscopic neg     Bacteria, U Microscopic neg     Mucus, UA trace     Epithelial cells, urine per micros neg     Crystals, Ur, HPF, POC neg     Casts, Ur, LPF, POC neg     Yeast, UA neg

## 2014-08-24 ENCOUNTER — Other Ambulatory Visit: Payer: Self-pay | Admitting: Orthopedic Surgery

## 2014-09-01 ENCOUNTER — Other Ambulatory Visit: Payer: Self-pay

## 2014-09-01 MED ORDER — FLUTICASONE-SALMETEROL 250-50 MCG/DOSE IN AEPB: 1.0000 | INHALATION_SPRAY | Freq: Two times a day (BID) | RESPIRATORY_TRACT | Status: DC

## 2014-09-08 ENCOUNTER — Ambulatory Visit (INDEPENDENT_AMBULATORY_CARE_PROVIDER_SITE_OTHER): Payer: Medicare Other | Admitting: Family Medicine

## 2014-09-08 ENCOUNTER — Ambulatory Visit: Payer: Medicare Other | Admitting: Family Medicine

## 2014-09-08 VITALS — BP 112/68 | HR 72 | Temp 98.1°F | Resp 17 | Ht 72.0 in | Wt 182.0 lb

## 2014-09-08 DIAGNOSIS — Z0289 Encounter for other administrative examinations: Secondary | ICD-10-CM

## 2014-09-08 DIAGNOSIS — Z23 Encounter for immunization: Secondary | ICD-10-CM

## 2014-09-08 NOTE — Progress Notes (Signed)
   Subjective:    Patient ID: Douglas Russell, male    DOB: 16-Oct-1951, 63 y.o.   MRN: [id number]  HPI    Review of Systems     Objective:   Physical Exam        Assessment & Plan:

## 2014-09-08 NOTE — Progress Notes (Deleted)
Encounter for flu vaccine.

## 2014-09-08 NOTE — Patient Instructions (Signed)
Get the hip surgery done as planned  Advise seeing your eye doctor as scheduled. Your vision passes, but it is borderline for the DOT requirements.  Return routinely in 3 or 4 months or as necessary.

## 2014-09-08 NOTE — Progress Notes (Signed)
DOT physical examination  History: Patient is here for his DOT physical exam. He is not currently driving but wants to keep his current active. 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   Past history: Surgeries: Pelvic fracture. Several surgeries on masses in his back. Scheduled for left total hip in October. Major illnesses: No major issues Allergies: See chart Regular medications: See chart  Social history: Currently not working. He takes pretty good care of himself. Stays as active as he can but the hip is been his limiting factor. He can drive well, but it's hard getting in and out and moving about.  Review of systems: Essentially unremarkable  Physical examination: TMs are normal but has some cerumen in both ear canals. Eyes PERRLA. EOMs intact. Fundi benign. Vision screen was retested by me and is 20/40 on the right and left and 20/30 combined. Throat clear. Neck supple without nodes. No carotid bruits. Chest clear. Heart regular without murmurs. And soft without mass or tenderness. Extremities painful left hip with decreased range of motion. Spine and a little stiff and arthritic. Normal male external genitalia with testes descended. No hernias. Skin unremarkable. Onychomycosis of toenails is markedly improving  Assessment:  DOT 2 year GERD DJD left hip, scheduled for surgery  Plan: Return in 3 or 4 months or as needed.

## 2014-09-08 NOTE — Addendum Note (Signed)
Addended by: Orion Crook on: 09/08/2014 04:43 PM   Modules accepted: Level of Service

## 2014-09-08 NOTE — Progress Notes (Signed)
Patient was here both for her the self-pay DOT and the flu shot, so there were 2 encounters opened, and there is some confusion in the medical record.Douglas Russell He needed the flu shot and was given it.

## 2014-09-08 NOTE — Progress Notes (Signed)
Patient requested a flu shot and that was given today. 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

## 2014-09-08 NOTE — Progress Notes (Deleted)
Encounter for immunizations.

## 2014-09-08 NOTE — Addendum Note (Signed)
Addended by: Orion Crook on: 09/08/2014 04:44 PM   Modules accepted: Level of Service

## 2014-09-15 ENCOUNTER — Encounter: Payer: Self-pay | Admitting: Family Medicine

## 2014-09-18 ENCOUNTER — Other Ambulatory Visit: Payer: Self-pay | Admitting: Family Medicine

## 2014-09-24 ENCOUNTER — Other Ambulatory Visit: Payer: Self-pay | Admitting: Family Medicine

## 2014-09-24 DIAGNOSIS — G479 Sleep disorder, unspecified: Secondary | ICD-10-CM

## 2014-09-24 MED ORDER — ZOLPIDEM TARTRATE 5 MG PO TABS
ORAL_TABLET | ORAL | Status: DC
Start: 2014-09-24 — End: 2014-09-26

## 2014-09-25 ENCOUNTER — Telehealth: Payer: Self-pay

## 2014-09-25 NOTE — Telephone Encounter (Signed)
Pt of Dr.Hopper is wanting a refill on his zolpidem (AMBIEN) 5 MG tablet [[id number]], please advise pt when ready for pick up

## 2014-09-26 ENCOUNTER — Encounter (HOSPITAL_COMMUNITY): Payer: Self-pay

## 2014-09-26 ENCOUNTER — Encounter (HOSPITAL_COMMUNITY)
Admission: RE | Admit: 2014-09-26 | Discharge: 2014-09-26 | Disposition: A | Discharge status: Home / Self Care | Payer: Medicare Other | Source: Ambulatory Visit | Attending: Orthopedic Surgery | Admitting: Orthopedic Surgery

## 2014-09-26 DIAGNOSIS — M169 Osteoarthritis of hip, unspecified: Secondary | ICD-10-CM | POA: Diagnosis not present

## 2014-09-26 DIAGNOSIS — M1610 Unilateral primary osteoarthritis, unspecified hip: Secondary | ICD-10-CM | POA: Insufficient documentation

## 2014-09-26 DIAGNOSIS — Z79899 Other long term (current) drug therapy: Secondary | ICD-10-CM | POA: Diagnosis not present

## 2014-09-26 DIAGNOSIS — Z01812 Encounter for preprocedural laboratory examination: Secondary | ICD-10-CM | POA: Diagnosis not present

## 2014-09-26 DIAGNOSIS — Z01810 Encounter for preprocedural cardiovascular examination: Secondary | ICD-10-CM | POA: Diagnosis not present

## 2014-09-26 DIAGNOSIS — Z7982 Long term (current) use of aspirin: Secondary | ICD-10-CM | POA: Diagnosis not present

## 2014-09-26 HISTORY — DX: Pneumonia, unspecified organism: J18.9

## 2014-09-26 HISTORY — DX: Personal history of other medical treatment: Z92.89

## 2014-09-26 LAB — CBC
HCT: 41.9 % (ref 39.0–52.0)
Hemoglobin: 14.1 g/dL (ref 13.0–17.0)
MCH: 29.1 pg (ref 26.0–34.0)
MCHC: 33.7 g/dL (ref 30.0–36.0)
MCV: 86.4 fL (ref 78.0–100.0)
Platelets: 169 10*3/uL (ref 150–400)
RBC: 4.85 MIL/uL (ref 4.22–5.81)
RDW: 14.8 % (ref 11.5–15.5)
WBC: 4.3 10*3/uL (ref 4.0–10.5)

## 2014-09-26 LAB — SURGICAL PCR SCREEN
MRSA, PCR: NEGATIVE
Staphylococcus aureus: POSITIVE — AB

## 2014-09-26 LAB — APTT: aPTT: 27 seconds (ref 24–37)

## 2014-09-26 LAB — BASIC METABOLIC PANEL
Anion gap: 11 (ref 5–15)
BUN: 17 mg/dL (ref 6–23)
CO2: 27 meq/L (ref 19–32)
Calcium: 9.5 mg/dL (ref 8.4–10.5)
Chloride: 100 mEq/L (ref 96–112)
Creatinine, Ser: 0.85 mg/dL (ref 0.50–1.35)
GFR calc Af Amer: >90 mL/min (ref >90)
GFR, EST NON AFRICAN AMERICAN: 89 mL/min — AB (ref >90)
GLUCOSE: 88 mg/dL (ref 70–99)
Potassium: 4.6 mEq/L (ref 3.7–5.3)
SODIUM: 138 meq/L (ref 137–147)

## 2014-09-26 LAB — PROTIME-INR
INR: 1.08 (ref 0.00–1.49)
PROTHROMBIN TIME: 14 s (ref 11.6–15.2)

## 2014-09-26 NOTE — Telephone Encounter (Signed)
Rx had been sent to [facility] in error. I called and cancelled it there and called into [facility] in [place]. Notified pt.

## 2014-09-26 NOTE — Pre-Procedure Instructions (Signed)
Douglas Russell  09/26/2014   Your procedure is scheduled on:  10.13.2015  Report to [facility] Admitting     ENTRANCE A  at 5:30 AM.  Call this number if you have problems the morning of surgery: [phone]   Remember:   Do not eat food or drink liquids after midnight.   Take these medicines the morning of surgery with A SIP OF WATER: advair if needed   Do not wear jewelry  Do not wear lotions, powders, or perfumes. You may wear deodorant.              Men may shave face and neck.  Do not bring valuables to the hospital.  [facility] is not responsible   for any belongings or valuables.               Contacts, dentures or bridgework may not be worn into surgery.  Leave suitcase in the car. After surgery it may be brought to your room.  For patients admitted to the hospital, discharge time is determined by your                treatment team.               Patients discharged the day of surgery will not be allowed to drive  home.  Name and phone number of your driver: [place]  Special Instructions: Special Instructions: [facility] - Preparing for Surgery  Before surgery, you can play an important role.  Because skin is not sterile, your skin needs to be as free of germs as possible.  You can reduce the number of germs on you skin by washing with CHG (chlorahexidine gluconate) soap before surgery.  CHG is an antiseptic cleaner which kills germs and bonds with the skin to continue killing germs even after washing.  Please DO NOT use if you have an allergy to CHG or antibacterial soaps.  If your skin becomes reddened/irritated stop using the CHG and inform your nurse when you arrive at Short Stay.  Do not shave (including legs and underarms) for at least 48 hours prior to the first CHG shower.  You may shave your face.  Please follow these instructions carefully:   1.  Shower with CHG Soap the night before surgery and the  morning of Surgery.  2.  If  you choose to wash your hair, wash your hair first as usual with your  normal shampoo.  3.  After you shampoo, rinse your hair and body thoroughly to remove the  Shampoo.  4.  Use CHG as you would any other liquid soap.  You can apply chg directly to the skin and wash gently with scrungie or a clean washcloth.  5.  Apply the CHG Soap to your body ONLY FROM THE NECK DOWN.    Do not use on open wounds or open sores.  Avoid contact with your eyes, ears, mouth and genitals (private parts).  Wash genitals (private parts)   with your normal soap.  6.  Wash thoroughly, paying special attention to the area where your surgery will be performed.  7.  Thoroughly rinse your body with warm water from the neck down.  8.  DO NOT shower/wash with your normal soap after using and rinsing off   the CHG Soap.  9.  Pat yourself dry with a clean towel.            10.  Wear clean pajamas.  11.  Place clean sheets on your bed the night of your first shower and do not sleep with pets.  Day of Surgery  Do not apply any lotions/deodorants the morning of surgery.  Please wear clean clothes to the hospital/surgery center.   Please read over the following fact sheets that you were given: Pain Booklet, Coughing and Deep Breathing, MRSA Information and Surgical Site Infection Prevention

## 2014-09-29 NOTE — Telephone Encounter (Signed)
Noted  

## 2014-10-09 MED ORDER — CEFAZOLIN SODIUM-DEXTROSE 2-3 GM-% IV SOLR
2.0000 g | INTRAVENOUS | Status: AC
  Administered 2014-10-10: 2 g via INTRAVENOUS
  Filled 2014-10-09: qty 50

## 2014-10-10 ENCOUNTER — Inpatient Hospital Stay (HOSPITAL_COMMUNITY): Payer: Medicare Other

## 2014-10-10 ENCOUNTER — Encounter (HOSPITAL_COMMUNITY)
Admission: RE | Disposition: A | Discharge status: Skilled Nursing Facility | Payer: Self-pay | Source: Ambulatory Visit | Attending: Orthopedic Surgery

## 2014-10-10 ENCOUNTER — Encounter (HOSPITAL_COMMUNITY): Payer: Self-pay | Admitting: *Deleted

## 2014-10-10 ENCOUNTER — Inpatient Hospital Stay (HOSPITAL_COMMUNITY)
Admission: RE | Admit: 2014-10-10 | Discharge: 2014-10-12 | DRG: 470 | Disposition: A | Discharge status: Skilled Nursing Facility | Payer: Medicare Other | Source: Ambulatory Visit | Attending: Orthopedic Surgery | Admitting: Orthopedic Surgery

## 2014-10-10 ENCOUNTER — Inpatient Hospital Stay (HOSPITAL_COMMUNITY): Payer: Medicare Other | Admitting: Anesthesiology

## 2014-10-10 ENCOUNTER — Encounter (HOSPITAL_COMMUNITY): Payer: Medicare Other | Admitting: Anesthesiology

## 2014-10-10 DIAGNOSIS — R278 Other lack of coordination: Secondary | ICD-10-CM | POA: Diagnosis not present

## 2014-10-10 DIAGNOSIS — J45909 Unspecified asthma, uncomplicated: Secondary | ICD-10-CM | POA: Diagnosis not present

## 2014-10-10 DIAGNOSIS — Z9229 Personal history of other drug therapy: Secondary | ICD-10-CM | POA: Diagnosis not present

## 2014-10-10 DIAGNOSIS — Z471 Aftercare following joint replacement surgery: Secondary | ICD-10-CM | POA: Diagnosis not present

## 2014-10-10 DIAGNOSIS — M6281 Muscle weakness (generalized): Secondary | ICD-10-CM | POA: Diagnosis not present

## 2014-10-10 DIAGNOSIS — Z809 Family history of malignant neoplasm, unspecified: Secondary | ICD-10-CM

## 2014-10-10 DIAGNOSIS — M25552 Pain in left hip: Secondary | ICD-10-CM | POA: Diagnosis present

## 2014-10-10 DIAGNOSIS — H409 Unspecified glaucoma: Secondary | ICD-10-CM | POA: Diagnosis present

## 2014-10-10 DIAGNOSIS — Z7982 Long term (current) use of aspirin: Secondary | ICD-10-CM

## 2014-10-10 DIAGNOSIS — Z8249 Family history of ischemic heart disease and other diseases of the circulatory system: Secondary | ICD-10-CM

## 2014-10-10 DIAGNOSIS — Z9889 Other specified postprocedural states: Secondary | ICD-10-CM | POA: Diagnosis not present

## 2014-10-10 DIAGNOSIS — M1612 Unilateral primary osteoarthritis, left hip: Principal | ICD-10-CM | POA: Diagnosis present

## 2014-10-10 DIAGNOSIS — Z833 Family history of diabetes mellitus: Secondary | ICD-10-CM

## 2014-10-10 DIAGNOSIS — M1610 Unilateral primary osteoarthritis, unspecified hip: Secondary | ICD-10-CM | POA: Diagnosis present

## 2014-10-10 DIAGNOSIS — M169 Osteoarthritis of hip, unspecified: Secondary | ICD-10-CM | POA: Diagnosis not present

## 2014-10-10 DIAGNOSIS — R262 Difficulty in walking, not elsewhere classified: Secondary | ICD-10-CM | POA: Diagnosis not present

## 2014-10-10 DIAGNOSIS — Z96642 Presence of left artificial hip joint: Secondary | ICD-10-CM | POA: Diagnosis not present

## 2014-10-10 HISTORY — DX: Unilateral primary osteoarthritis, left hip: M16.12

## 2014-10-10 HISTORY — PX: TOTAL HIP ARTHROPLASTY: SHX124

## 2014-10-10 LAB — GLUCOSE, CAPILLARY: Glucose-Capillary: 105 mg/dL — ABNORMAL HIGH (ref 70–99)

## 2014-10-10 SURGERY — ARTHROPLASTY, HIP, TOTAL,POSTERIOR APPROACH
Anesthesia: General | Laterality: Left

## 2014-10-10 MED ORDER — MIDAZOLAM HCL 5 MG/5ML IJ SOLN
INTRAMUSCULAR | Status: DC | PRN
  Administered 2014-10-10: 2 mg via INTRAVENOUS

## 2014-10-10 MED ORDER — BACLOFEN 10 MG PO TABS: 10.0000 mg | ORAL_TABLET | Freq: Three times a day (TID) | ORAL | Status: DC

## 2014-10-10 MED ORDER — PHENYLEPHRINE 40 MCG/ML (10ML) SYRINGE FOR IV PUSH (FOR BLOOD PRESSURE SUPPORT)
PREFILLED_SYRINGE | INTRAVENOUS | Status: AC
  Filled 2014-10-10: qty 20

## 2014-10-10 MED ORDER — OXYCODONE HCL 5 MG PO TABS
5.0000 mg | ORAL_TABLET | ORAL | Status: DC | PRN
  Administered 2014-10-10 – 2014-10-12 (×8): 10 mg via ORAL
  Filled 2014-10-10 (×8): qty 2

## 2014-10-10 MED ORDER — OXYCODONE HCL 5 MG/5ML PO SOLN: 5.0000 mg | Freq: Once | ORAL | Status: DC | PRN

## 2014-10-10 MED ORDER — ONDANSETRON HCL 4 MG/2ML IJ SOLN
4.0000 mg | Freq: Once | INTRAMUSCULAR | Status: DC | PRN
Start: 2014-10-10 — End: 2014-10-10

## 2014-10-10 MED ORDER — ASPIRIN EC 81 MG PO TBEC
81.0000 mg | DELAYED_RELEASE_TABLET | Freq: Every day | ORAL | Status: DC
  Administered 2014-10-10 – 2014-10-12 (×3): 81 mg via ORAL
  Filled 2014-10-10 (×3): qty 1

## 2014-10-10 MED ORDER — ACETAMINOPHEN 325 MG PO TABS: 650.0000 mg | ORAL_TABLET | Freq: Four times a day (QID) | ORAL | Status: DC | PRN

## 2014-10-10 MED ORDER — PHENOL 1.4 % MT LIQD: 1.0000 | OROMUCOSAL | Status: DC | PRN

## 2014-10-10 MED ORDER — KETOROLAC TROMETHAMINE 15 MG/ML IJ SOLN
15.0000 mg | Freq: Four times a day (QID) | INTRAMUSCULAR | Status: AC
  Administered 2014-10-10 – 2014-10-11 (×4): 15 mg via INTRAVENOUS
  Filled 2014-10-10 (×4): qty 1

## 2014-10-10 MED ORDER — PROPOFOL 10 MG/ML IV BOLUS
INTRAVENOUS | Status: AC
  Filled 2014-10-10: qty 20

## 2014-10-10 MED ORDER — MENTHOL 3 MG MT LOZG: 1.0000 | LOZENGE | OROMUCOSAL | Status: DC | PRN

## 2014-10-10 MED ORDER — ZOLPIDEM TARTRATE 5 MG PO TABS
2.5000 mg | ORAL_TABLET | Freq: Every evening | ORAL | Status: DC | PRN
  Administered 2014-10-10: 5 mg via ORAL
  Filled 2014-10-10: qty 1

## 2014-10-10 MED ORDER — RIVAROXABAN 10 MG PO TABS: 10.0000 mg | ORAL_TABLET | Freq: Every day | ORAL | Status: DC

## 2014-10-10 MED ORDER — MAGNESIUM CITRATE PO SOLN
1.0000 | Freq: Once | ORAL | Status: AC | PRN
Start: 2014-10-10 — End: 2014-10-10

## 2014-10-10 MED ORDER — METOCLOPRAMIDE HCL 10 MG PO TABS: 5.0000 mg | ORAL_TABLET | Freq: Three times a day (TID) | ORAL | Status: DC | PRN

## 2014-10-10 MED ORDER — SENNA 8.6 MG PO TABS
1.0000 | ORAL_TABLET | Freq: Two times a day (BID) | ORAL | Status: DC
  Administered 2014-10-10 – 2014-10-12 (×5): 8.6 mg via ORAL
  Filled 2014-10-10 (×6): qty 1

## 2014-10-10 MED ORDER — LATANOPROST 0.005 % OP SOLN
1.0000 [drp] | Freq: Every day | OPHTHALMIC | Status: DC
  Administered 2014-10-10 – 2014-10-11 (×2): 1 [drp] via OPHTHALMIC
  Filled 2014-10-10: qty 2.5

## 2014-10-10 MED ORDER — METOCLOPRAMIDE HCL 5 MG/ML IJ SOLN: 5.0000 mg | Freq: Three times a day (TID) | INTRAMUSCULAR | Status: DC | PRN

## 2014-10-10 MED ORDER — DEXAMETHASONE SODIUM PHOSPHATE 10 MG/ML IJ SOLN
INTRAMUSCULAR | Status: DC | PRN
  Administered 2014-10-10: 10 mg via INTRAVENOUS

## 2014-10-10 MED ORDER — DEXAMETHASONE SODIUM PHOSPHATE 10 MG/ML IJ SOLN
10.0000 mg | Freq: Once | INTRAMUSCULAR | Status: AC
  Administered 2014-10-11: 10 mg via INTRAVENOUS
  Filled 2014-10-10: qty 1

## 2014-10-10 MED ORDER — FENTANYL CITRATE 0.05 MG/ML IJ SOLN
INTRAMUSCULAR | Status: AC
  Filled 2014-10-10: qty 5

## 2014-10-10 MED ORDER — SODIUM CHLORIDE 0.9 % IJ SOLN
INTRAMUSCULAR | Status: AC
  Filled 2014-10-10: qty 10

## 2014-10-10 MED ORDER — MEPERIDINE HCL 25 MG/ML IJ SOLN: 6.2500 mg | INTRAMUSCULAR | Status: DC | PRN

## 2014-10-10 MED ORDER — PHENYLEPHRINE HCL 10 MG/ML IJ SOLN
INTRAMUSCULAR | Status: DC | PRN
  Administered 2014-10-10: 80 ug via INTRAVENOUS
  Administered 2014-10-10: 40 ug via INTRAVENOUS
  Administered 2014-10-10: 120 ug via INTRAVENOUS
  Administered 2014-10-10 (×3): 80 ug via INTRAVENOUS

## 2014-10-10 MED ORDER — EPHEDRINE SULFATE 50 MG/ML IJ SOLN
INTRAMUSCULAR | Status: AC
  Filled 2014-10-10: qty 1

## 2014-10-10 MED ORDER — MIDAZOLAM HCL 2 MG/2ML IJ SOLN
INTRAMUSCULAR | Status: AC
  Filled 2014-10-10: qty 2

## 2014-10-10 MED ORDER — HYDROMORPHONE HCL 1 MG/ML IJ SOLN: 1.0000 mg | INTRAMUSCULAR | Status: DC | PRN

## 2014-10-10 MED ORDER — RIVAROXABAN 10 MG PO TABS
10.0000 mg | ORAL_TABLET | Freq: Every day | ORAL | Status: DC
  Administered 2014-10-11 – 2014-10-12 (×2): 10 mg via ORAL
  Filled 2014-10-10 (×3): qty 1

## 2014-10-10 MED ORDER — ONDANSETRON HCL 4 MG PO TABS: 4.0000 mg | ORAL_TABLET | Freq: Four times a day (QID) | ORAL | Status: DC | PRN

## 2014-10-10 MED ORDER — DIPHENHYDRAMINE HCL 12.5 MG/5ML PO ELIX: 12.5000 mg | ORAL_SOLUTION | ORAL | Status: DC | PRN

## 2014-10-10 MED ORDER — ROCURONIUM BROMIDE 50 MG/5ML IV SOLN
INTRAVENOUS | Status: AC
  Filled 2014-10-10: qty 1

## 2014-10-10 MED ORDER — PHENYLEPHRINE HCL 10 MG/ML IJ SOLN
10.0000 mg | INTRAVENOUS | Status: DC | PRN
  Administered 2014-10-10: 30 ug/min via INTRAVENOUS

## 2014-10-10 MED ORDER — ROCURONIUM BROMIDE 100 MG/10ML IV SOLN
INTRAVENOUS | Status: DC | PRN
  Administered 2014-10-10: 50 mg via INTRAVENOUS

## 2014-10-10 MED ORDER — VANCOMYCIN HCL IN DEXTROSE 1-5 GM/200ML-% IV SOLN
1000.0000 mg | Freq: Once | INTRAVENOUS | Status: AC
  Administered 2014-10-10: 1000 mg via INTRAVENOUS
  Filled 2014-10-10 (×2): qty 200

## 2014-10-10 MED ORDER — ARTIFICIAL TEARS OP OINT
TOPICAL_OINTMENT | OPHTHALMIC | Status: AC
  Filled 2014-10-10: qty 3.5

## 2014-10-10 MED ORDER — MELATONIN 10 MG PO TABS: 10.0000 mg | ORAL_TABLET | Freq: Every day | ORAL | Status: DC

## 2014-10-10 MED ORDER — EPHEDRINE SULFATE 50 MG/ML IJ SOLN
INTRAMUSCULAR | Status: DC | PRN
  Administered 2014-10-10: 15 mg via INTRAVENOUS
  Administered 2014-10-10: 10 mg via INTRAVENOUS

## 2014-10-10 MED ORDER — ARTIFICIAL TEARS OP OINT
TOPICAL_OINTMENT | OPHTHALMIC | Status: DC | PRN
  Administered 2014-10-10: 1 via OPHTHALMIC

## 2014-10-10 MED ORDER — ALBUMIN HUMAN 5 % IV SOLN
INTRAVENOUS | Status: DC | PRN
  Administered 2014-10-10: 09:00:00 via INTRAVENOUS

## 2014-10-10 MED ORDER — GLYCOPYRROLATE 0.2 MG/ML IJ SOLN
INTRAMUSCULAR | Status: AC
  Filled 2014-10-10: qty 3

## 2014-10-10 MED ORDER — FENTANYL CITRATE 0.05 MG/ML IJ SOLN
INTRAMUSCULAR | Status: DC | PRN
  Administered 2014-10-10 (×2): 100 ug via INTRAVENOUS
  Administered 2014-10-10: 50 ug via INTRAVENOUS

## 2014-10-10 MED ORDER — CEFAZOLIN SODIUM-DEXTROSE 2-3 GM-% IV SOLR
2.0000 g | Freq: Four times a day (QID) | INTRAVENOUS | Status: AC
  Administered 2014-10-10 (×2): 2 g via INTRAVENOUS
  Filled 2014-10-10 (×2): qty 50

## 2014-10-10 MED ORDER — LIDOCAINE HCL (CARDIAC) 20 MG/ML IV SOLN
INTRAVENOUS | Status: AC
  Filled 2014-10-10: qty 5

## 2014-10-10 MED ORDER — ONDANSETRON HCL 4 MG/2ML IJ SOLN
INTRAMUSCULAR | Status: AC
  Filled 2014-10-10: qty 2

## 2014-10-10 MED ORDER — ONDANSETRON HCL 4 MG/2ML IJ SOLN: 4.0000 mg | Freq: Four times a day (QID) | INTRAMUSCULAR | Status: DC | PRN

## 2014-10-10 MED ORDER — BISACODYL 10 MG RE SUPP: 10.0000 mg | Freq: Every day | RECTAL | Status: DC | PRN

## 2014-10-10 MED ORDER — ALUM & MAG HYDROXIDE-SIMETH 200-200-20 MG/5ML PO SUSP: 30.0000 mL | ORAL | Status: DC | PRN

## 2014-10-10 MED ORDER — BUPIVACAINE HCL (PF) 0.25 % IJ SOLN
INTRAMUSCULAR | Status: AC
  Filled 2014-10-10: qty 30

## 2014-10-10 MED ORDER — OXYCODONE-ACETAMINOPHEN 10-325 MG PO TABS: 1.0000 | ORAL_TABLET | Freq: Four times a day (QID) | ORAL | Status: DC | PRN

## 2014-10-10 MED ORDER — METHOCARBAMOL 1000 MG/10ML IJ SOLN
500.0000 mg | Freq: Four times a day (QID) | INTRAVENOUS | Status: DC | PRN
  Filled 2014-10-10: qty 5

## 2014-10-10 MED ORDER — LACTATED RINGERS IV SOLN
INTRAVENOUS | Status: DC | PRN
  Administered 2014-10-10 (×2): via INTRAVENOUS

## 2014-10-10 MED ORDER — METHOCARBAMOL 500 MG PO TABS
500.0000 mg | ORAL_TABLET | Freq: Four times a day (QID) | ORAL | Status: DC | PRN
  Administered 2014-10-11 – 2014-10-12 (×2): 500 mg via ORAL
  Filled 2014-10-10 (×3): qty 1

## 2014-10-10 MED ORDER — BUPIVACAINE HCL (PF) 0.25 % IJ SOLN
INTRAMUSCULAR | Status: DC | PRN
  Administered 2014-10-10: 20 mL

## 2014-10-10 MED ORDER — DOCUSATE SODIUM 100 MG PO CAPS
100.0000 mg | ORAL_CAPSULE | Freq: Two times a day (BID) | ORAL | Status: DC
  Administered 2014-10-10 – 2014-10-12 (×5): 100 mg via ORAL
  Filled 2014-10-10 (×4): qty 1

## 2014-10-10 MED ORDER — PROPOFOL 10 MG/ML IV BOLUS
INTRAVENOUS | Status: DC | PRN
  Administered 2014-10-10: 20 mg via INTRAVENOUS
  Administered 2014-10-10: 110 mg via INTRAVENOUS

## 2014-10-10 MED ORDER — NEOSTIGMINE METHYLSULFATE 10 MG/10ML IV SOLN
INTRAVENOUS | Status: AC
  Filled 2014-10-10: qty 1

## 2014-10-10 MED ORDER — 0.9 % SODIUM CHLORIDE (POUR BTL) OPTIME
TOPICAL | Status: DC | PRN
  Administered 2014-10-10: 1000 mL

## 2014-10-10 MED ORDER — POTASSIUM CHLORIDE IN NACL 20-0.45 MEQ/L-% IV SOLN
INTRAVENOUS | Status: DC
  Administered 2014-10-10: 14:00:00 via INTRAVENOUS
  Filled 2014-10-10 (×6): qty 1000

## 2014-10-10 MED ORDER — OXYCODONE HCL 5 MG PO TABS: 5.0000 mg | ORAL_TABLET | Freq: Once | ORAL | Status: DC | PRN

## 2014-10-10 MED ORDER — ONDANSETRON HCL 4 MG PO TABS: 4.0000 mg | ORAL_TABLET | Freq: Three times a day (TID) | ORAL | Status: DC | PRN

## 2014-10-10 MED ORDER — LIDOCAINE HCL (CARDIAC) 20 MG/ML IV SOLN
INTRAVENOUS | Status: DC | PRN
  Administered 2014-10-10: 100 mg via INTRAVENOUS

## 2014-10-10 MED ORDER — MOMETASONE FURO-FORMOTEROL FUM 100-5 MCG/ACT IN AERO
2.0000 | INHALATION_SPRAY | Freq: Two times a day (BID) | RESPIRATORY_TRACT | Status: DC
  Administered 2014-10-10 – 2014-10-12 (×4): 2 via RESPIRATORY_TRACT
  Filled 2014-10-10: qty 8.8

## 2014-10-10 MED ORDER — HYDROMORPHONE HCL 1 MG/ML IJ SOLN: 0.2500 mg | INTRAMUSCULAR | Status: DC | PRN

## 2014-10-10 MED ORDER — POLYETHYLENE GLYCOL 3350 17 G PO PACK: 17.0000 g | PACK | Freq: Every day | ORAL | Status: DC | PRN

## 2014-10-10 MED ORDER — SENNA-DOCUSATE SODIUM 8.6-50 MG PO TABS: 2.0000 | ORAL_TABLET | Freq: Every day | ORAL | Status: DC

## 2014-10-10 MED ORDER — ONDANSETRON HCL 4 MG/2ML IJ SOLN
INTRAMUSCULAR | Status: DC | PRN
  Administered 2014-10-10: 4 mg via INTRAVENOUS

## 2014-10-10 MED ORDER — ACETAMINOPHEN 650 MG RE SUPP: 650.0000 mg | Freq: Four times a day (QID) | RECTAL | Status: DC | PRN

## 2014-10-10 MED ORDER — GLYCOPYRROLATE 0.2 MG/ML IJ SOLN
INTRAMUSCULAR | Status: DC | PRN
  Administered 2014-10-10: 0.6 mg via INTRAVENOUS

## 2014-10-10 MED ORDER — NEOSTIGMINE METHYLSULFATE 10 MG/10ML IV SOLN
INTRAVENOUS | Status: DC | PRN
  Administered 2014-10-10: 4 mg via INTRAVENOUS

## 2014-10-10 SURGICAL SUPPLY — 64 items
APL SKNCLS STERI-STRIP NONHPOA (GAUZE/BANDAGES/DRESSINGS) ×1
BENZOIN TINCTURE PRP APPL 2/3 (GAUZE/BANDAGES/DRESSINGS) ×2 IMPLANT
BLADE SAW SAG 73X25 THK (BLADE) ×1
BLADE SAW SGTL 73X25 THK (BLADE) ×1 IMPLANT
BRUSH FEMORAL CANAL (MISCELLANEOUS) IMPLANT
CAPT HIP PF MOP ×1 IMPLANT
CLSR STERI-STRIP ANTIMIC 1/2X4 (GAUZE/BANDAGES/DRESSINGS) ×3 IMPLANT
COVER SURGICAL LIGHT HANDLE (MISCELLANEOUS) ×2 IMPLANT
DRAPE INCISE IOBAN 66X45 STRL (DRAPES) ×1 IMPLANT
DRAPE ORTHO SPLIT 77X108 STRL (DRAPES) ×4
DRAPE PROXIMA HALF (DRAPES) ×2 IMPLANT
DRAPE SURG ORHT 6 SPLT 77X108 (DRAPES) ×2 IMPLANT
DRAPE U-SHAPE 47X51 STRL (DRAPES) ×2 IMPLANT
DRILL BIT 5/64 (BIT) ×1 IMPLANT
DRSG MEPILEX BORDER 4X12 (GAUZE/BANDAGES/DRESSINGS) ×1 IMPLANT
DRSG MEPILEX BORDER 4X8 (GAUZE/BANDAGES/DRESSINGS) IMPLANT
DRSG PAD ABDOMINAL 8X10 ST (GAUZE/BANDAGES/DRESSINGS) IMPLANT
DURAPREP 26ML APPLICATOR (WOUND CARE) ×2 IMPLANT
ELECT CAUTERY BLADE 6.4 (BLADE) ×2 IMPLANT
ELECT REM PT RETURN 9FT ADLT (ELECTROSURGICAL) ×2
ELECTRODE REM PT RTRN 9FT ADLT (ELECTROSURGICAL) ×1 IMPLANT
GAUZE SPONGE 4X4 12PLY STRL (GAUZE/BANDAGES/DRESSINGS) ×1 IMPLANT
GLOVE BIO SURGEON STRL SZ8 (GLOVE) ×1 IMPLANT
GLOVE BIOGEL PI IND STRL 6.5 (GLOVE) IMPLANT
GLOVE BIOGEL PI IND STRL 8 (GLOVE) IMPLANT
GLOVE BIOGEL PI INDICATOR 6.5 (GLOVE) ×1
GLOVE BIOGEL PI INDICATOR 8 (GLOVE) ×1
GLOVE BIOGEL PI ORTHO PRO SZ8 (GLOVE) ×1
GLOVE ORTHO TXT STRL SZ7.5 (GLOVE) ×2 IMPLANT
GLOVE PI ORTHO PRO STRL SZ8 (GLOVE) ×1 IMPLANT
GLOVE SURG ORTHO 8.0 STRL STRW (GLOVE) ×2 IMPLANT
GLOVE SURG SS PI 7.5 STRL IVOR (GLOVE) ×1 IMPLANT
GOWN STRL REUS W/ TWL LRG LVL3 (GOWN DISPOSABLE) ×1 IMPLANT
GOWN STRL REUS W/TWL LRG LVL3 (GOWN DISPOSABLE) ×4
HANDPIECE INTERPULSE COAX TIP (DISPOSABLE)
HOOD PEEL AWAY FACE SHEILD DIS (HOOD) ×5 IMPLANT
KIT BASIN OR (CUSTOM PROCEDURE TRAY) ×2 IMPLANT
KIT ROOM TURNOVER OR (KITS) ×2 IMPLANT
MANIFOLD NEPTUNE II (INSTRUMENTS) ×2 IMPLANT
NDL HYPO 25GX1X1/2 BEV (NEEDLE) ×1 IMPLANT
NEEDLE HYPO 25GX1X1/2 BEV (NEEDLE) ×2 IMPLANT
NS IRRIG 1000ML POUR BTL (IV SOLUTION) ×2 IMPLANT
PACK TOTAL JOINT (CUSTOM PROCEDURE TRAY) ×2 IMPLANT
PAD ARMBOARD 7.5X6 YLW CONV (MISCELLANEOUS) ×5 IMPLANT
PILLOW ABDUCTION HIP (SOFTGOODS) ×2 IMPLANT
PRESSURIZER FEMORAL UNIV (MISCELLANEOUS) IMPLANT
RETRIEVER SUT HEWSON (MISCELLANEOUS) ×2 IMPLANT
SET HNDPC FAN SPRY TIP SCT (DISPOSABLE) IMPLANT
SPONGE LAP 4X18 X RAY DECT (DISPOSABLE) IMPLANT
SUCTION FRAZIER TIP 10 FR DISP (SUCTIONS) ×2 IMPLANT
SUT FIBERWIRE #2 38 REV NDL BL (SUTURE) ×6
SUT MNCRL AB 4-0 PS2 18 (SUTURE) ×1 IMPLANT
SUT VIC AB 0 CT1 27 (SUTURE) ×2
SUT VIC AB 0 CT1 27XBRD ANBCTR (SUTURE) ×1 IMPLANT
SUT VIC AB 2-0 CT1 27 (SUTURE) ×4
SUT VIC AB 2-0 CT1 TAPERPNT 27 (SUTURE) ×1 IMPLANT
SUT VIC AB 3-0 SH 8-18 (SUTURE) ×2 IMPLANT
SUTURE FIBERWR#2 38 REV NDL BL (SUTURE) ×3 IMPLANT
SYR CONTROL 10ML LL (SYRINGE) ×2 IMPLANT
TOWEL OR 17X24 6PK STRL BLUE (TOWEL DISPOSABLE) ×2 IMPLANT
TOWEL OR 17X26 10 PK STRL BLUE (TOWEL DISPOSABLE) ×2 IMPLANT
TOWER CARTRIDGE SMART MIX (DISPOSABLE) IMPLANT
TRAY FOLEY CATH 14FR (SET/KITS/TRAYS/PACK) IMPLANT
WATER STERILE IRR 1000ML POUR (IV SOLUTION) ×2 IMPLANT

## 2014-10-10 NOTE — Op Note (Signed)
10/10/2014  9:23 AM  PATIENT:  Douglas Russell   MRN: [id number]  PRE-OPERATIVE DIAGNOSIS:  djd left hip  POST-OPERATIVE DIAGNOSIS:  djd left hip  PROCEDURE:  Procedure(s): TOTAL HIP ARTHROPLASTY  PREOPERATIVE INDICATIONS:    Douglas Russell is an 63 y.o. male who has a diagnosis of Osteoarthritis of left hip and elected for surgical management after failing conservative treatment.  The risks benefits and alternatives were discussed with the patient including but not limited to the risks of nonoperative treatment, versus surgical intervention including infection, bleeding, nerve injury, periprosthetic fracture, the need for revision surgery, dislocation, leg length discrepancy, blood clots, cardiopulmonary complications, morbidity, mortality, among others, and they were willing to proceed.     OPERATIVE REPORT     SURGEON:  Marchia Bond, MD    ASSISTANT:  Joya Gaskins, OPA-C  (Present throughout the entire procedure,  necessary for completion of procedure in a timely manner, assisting with retraction, instrumentation, and closure)     Second assistant: Mechele Claude, PA Student   ANESTHESIA:  General    COMPLICATIONS:  None.     COMPONENTS:  [employer] fit femur size 5 with a 36 mm +5 head ball and a gription acetabular shell size 58 with a 10 degree lipped polyethylene liner    UNIQUE PORTIONS OF THE CASE: The acetabulum was quite large.  The femoral neck was quite short, and even with a relatively high neck cut adjacent to the head, there was less than a thumb's breadth above the lesser trochanter after resection, despite the fact that the cut had no vertical loss of bone laterally (I did not need an osteotome on the lateral side of the cut).  PROCEDURE IN DETAIL:   The patient was met in the holding area and  identified.  The appropriate hip was identified and marked at the operative site.  The patient was then transported to the OR  and  placed under general  anesthesia.  At that point, the patient was  placed in the lateral decubitus position with the operative side up and  secured to the operating room table and all bony prominences padded.     The operative lower extremity was prepped from the iliac crest to the distal leg.  Sterile draping was performed.  Time out was performed prior to incision.      A routine posterolateral approach was utilized via sharp dissection  carried down to the subcutaneous tissue.  Gross bleeders were Bovie coagulated.  The iliotibial band was identified and incised along the length of the skin incision.  Self-retaining retractors were  inserted.  With the hip internally rotated, the short external rotators  were identified. The piriformis and capsule was tagged with FiberWire, and the hip capsule released in a T-type fashion.  The femoral neck was exposed, and I resected the femoral neck using the appropriate jig. This was performed at approximately a thumb's breadth above the lesser trochanter.    I then exposed the deep acetabulum, cleared out any tissue including the ligamentum teres.  A wing retractor was placed.  After adequate visualization, I excised the labrum, and then sequentially reamed.  I placed the trial acetabulum, which seated nicely, and then impacted the real cup into place.  Appropriate version and inclination was confirmed clinically matching their bony anatomy, and also with the use of the jig.  A trial polyethylene liner was placed and the wing retractor removed.    I then prepared the proximal femur  using the cookie-cutter, the lateralizing reamer, and then sequentially reamed and broached.  A trial broach, neck, and head was utilized, and I reduced the hip and it was found to have excellent stability with functional range of motion. The trial components were then removed, and the real polyethylene liner was placed with the lip directed posteriorly.  I then impacted the real femoral prosthesis into  place into the appropriate version, slightly anteverted to the normal anatomy, and I impacted the real head ball into place. The hip was then reduced and taken through functional range of motion and found to have excellent stability. Leg lengths were restored.  I then used a 2 mm drill bits to pass the FiberWire suture from the capsule and piriformis through the greater trochanter, and secured this. Excellent posterior capsular repair was achieved. I also closed the T in the capsule.  I then irrigated the hip copiously again with pulse lavage, and repaired the fascia with Vicryl, followed by Vicryl for the subcutaneous tissue, Monocryl for the skin, Steri-Strips and sterile gauze. The wounds were injected. The patient was then awakened and returned to PACU in stable and satisfactory condition. There were no complications.  Marchia Bond, MD Orthopedic Surgeon [phone]   10/10/2014 9:23 AM

## 2014-10-10 NOTE — Progress Notes (Signed)
Utilization review completed.  

## 2014-10-10 NOTE — Anesthesia Preprocedure Evaluation (Signed)
Anesthesia Evaluation  Patient identified by MRN, date of birth, ID band Patient awake    Reviewed: Allergy & Precautions, H&P , NPO status , Patient's Chart, lab work & pertinent test results  Airway Mallampati: I TM Distance: >3 FB Neck ROM: Full    Dental   Pulmonary asthma ,          Cardiovascular     Neuro/Psych    GI/Hepatic   Endo/Other    Renal/GU      Musculoskeletal   Abdominal   Peds  Hematology   Anesthesia Other Findings   Reproductive/Obstetrics                           Anesthesia Physical Anesthesia Plan  ASA: II  Anesthesia Plan: General   Post-op Pain Management:    Induction: Intravenous  Airway Management Planned: Oral ETT  Additional Equipment:   Intra-op Plan:   Post-operative Plan: Extubation in OR  Informed Consent: I have reviewed the patients History and Physical, chart, labs and discussed the procedure including the risks, benefits and alternatives for the proposed anesthesia with the patient or authorized representative who has indicated his/her understanding and acceptance.     Plan Discussed with: CRNA and Surgeon  Anesthesia Plan Comments:         Anesthesia Quick Evaluation

## 2014-10-10 NOTE — Progress Notes (Signed)
OT Cancellation Note  Patient Details Name: Douglas Russell MRN: [id number] DOB: 05-13-1951   Cancelled Treatment:    Reason Eval/Treat Not Completed: OT screened, no needs identified, will sign off.  Pt is Medicare and current D/C plan is SNF. No apparent immediate acute care OT needs, therefore will defer OT to SNF. If OT eval is needed please call Acute Rehab Dept. at [phone] or text page OT at [phone].     Benito Mccreedy OTR/L [phone] 10/10/2014, 5:03 PM

## 2014-10-10 NOTE — Transfer of Care (Signed)
Immediate Anesthesia Transfer of Care Note  Patient: Douglas Russell  Procedure(s) Performed: Procedure(s): TOTAL HIP ARTHROPLASTY (Left)  Patient Location: PACU  Anesthesia Type:General  Level of Consciousness: awake, alert , oriented and sedated  Airway & Oxygen Therapy: Patient Spontanous Breathing and Patient connected to nasal cannula oxygen  Post-op Assessment: Report given to PACU RN, Post -op Vital signs reviewed and stable and Patient moving all extremities  Post vital signs: Reviewed and stable  Complications: No apparent anesthesia complications

## 2014-10-10 NOTE — Evaluation (Signed)
Physical Therapy Evaluation Patient Details Name: Douglas Russell MRN: [id number] DOB: [birth year] Today's Date: 10/10/2014   History of Present Illness  63 y.o. male s/p left total hip arthroplasty via posterior approach.  Clinical Impression  Pt is s/p left THA resulting in the deficits listed below (see PT Problem List). Ambulates up to 25 feet with min guard assist post op day #0. Min assist with bed mobility. Reviewed posterior hip precautions and safety with mobility, including therapeutic exercises. He lives alone and will not have assistance at home. Pt will benefit from skilled PT to increase their independence and safety with mobility to allow discharge to the venue listed below.     Follow Up Recommendations SNF;Supervision for mobility/OOB    Equipment Recommendations  Rolling walker with 5" wheels;3in1 (PT)    Recommendations for Other Services       Precautions / Restrictions Precautions Precautions: Posterior Hip Precaution Booklet Issued: Yes (comment) Precaution Comments: Reviewed posterior hip precautions Restrictions Weight Bearing Restrictions: Yes LLE Weight Bearing: Weight bearing as tolerated      Mobility  Bed Mobility Overal bed mobility: Needs Assistance Bed Mobility: Supine to Sit     Supine to sit: Min assist;HOB elevated     General bed mobility comments: Min assist for LLE support out of bed. Use of rail with HOB elevated. VC for technique and to maintain hip precautions  Transfers Overall transfer level: Needs assistance Equipment used: Rolling walker (2 wheeled) Transfers: Sit to/from Stand Sit to Stand: Min guard         General transfer comment: Min guard for safety. VC for hand and foot placement. educated on technique to maintain posterior hip precautions. Good descent into chair  Ambulation/Gait Ambulation/Gait assistance: Min guard Ambulation Distance (Feet): 25 Feet Assistive device: Rolling walker (2 wheeled) Gait  Pattern/deviations: Step-to pattern;Decreased step length - right;Decreased stance time - left;Antalgic   Gait velocity interpretation: Below normal speed for age/gender General Gait Details: Educated on safe DME use with a rolling walker. VC for sequencing and walker placement. Encouraged to increase step length. Bearing majority of weight through LLE. Very slow and guarded  Stairs            Wheelchair Mobility    Modified Rankin (Stroke Patients Only)       Balance Overall balance assessment: Needs assistance Sitting-balance support: No upper extremity supported;Feet supported Sitting balance-Leahy Scale: Fair     Standing balance support: Bilateral upper extremity supported Standing balance-Leahy Scale: Poor                               Pertinent Vitals/Pain Pain Assessment: 0-10 Pain Score: 2  Pain Location: left hip Pain Descriptors / Indicators: Dull;Aching Pain Intervention(s): Monitored during session;Repositioned    Home Living Family/patient expects to be discharged to:: Skilled nursing facility [facility] Living Arrangements: Alone Available Help at Discharge: [place] Type of Home: House Home Access: Stairs to enter Entrance Stairs-Rails: Right Entrance Stairs-Number of Steps: 3 Home Layout: One level Home Equipment: Cane - single point;Hand held shower head      Prior Function Level of Independence: Independent with assistive device(s)         Comments: intermittent use of cane for ambulation     Hand Dominance   Dominant Hand: Right    Extremity/Trunk Assessment   Upper Extremity Assessment: Defer to OT evaluation  Lower Extremity Assessment: LLE deficits/detail   LLE Deficits / Details: Decreased strength and ROm as expected post op     Communication   Communication: No difficulties  Cognition Arousal/Alertness: Awake/alert Behavior During Therapy: WFL for tasks  assessed/performed Overall Cognitive Status: Within Functional Limits for tasks assessed                      General Comments      Exercises Total Joint Exercises Ankle Circles/Pumps: AROM;Both;10 reps;Supine Quad Sets: Strengthening;Both;10 reps;Supine Gluteal Sets: Strengthening;Both;10 reps;Seated      Assessment/Plan    PT Assessment Patient needs continued PT services  PT Diagnosis Difficulty walking;Abnormality of gait;Acute pain   PT Problem List Decreased strength;Decreased range of motion;Decreased activity tolerance;Decreased balance;Decreased mobility;Decreased knowledge of use of DME;Decreased knowledge of precautions;Pain  PT Treatment Interventions DME instruction;Gait training;Stair training;Functional mobility training;Therapeutic activities;Therapeutic exercise;Balance training;Neuromuscular re-education;Patient/family education;Modalities   PT Goals (Current goals can be found in the Care Plan section) Acute Rehab PT Goals Patient Stated Goal: Improve my quality of life PT Goal Formulation: With patient Time For Goal Achievement: 10/17/14 Potential to Achieve Goals: Good    Frequency 7X/week   Barriers to discharge Decreased caregiver support Pt lives alone    Co-evaluation               End of Session Equipment Utilized During Treatment: Gait belt Activity Tolerance: Patient tolerated treatment well Patient left: in chair;with call bell/phone within reach           Time: 1551-1622 PT Time Calculation (min): 31 min   Charges:   PT Evaluation $Initial PT Evaluation Tier I: 1 Procedure PT Treatments $Therapeutic Activity: 8-22 mins   PT G Codes:         Elayne Snare, [place]  [name] 10/10/2014, 4:35 PM

## 2014-10-10 NOTE — Progress Notes (Signed)
Report given to robin roberts rn as cargiver 

## 2014-10-10 NOTE — H&P (Signed)
PREOPERATIVE H&P  Chief Complaint: Left hip pain  HPI: Douglas Russell is a 63 y.o. male who presents for preoperative history and physical with a diagnosis of djd left hip. Symptoms are rated as moderate to severe, and have been worsening.  This is significantly impairing activities of daily living.  He has elected for surgical management. He has failed anti-inflammatories, injections, activity modification, use of a cane, among others, and has elected for surgical intervention.  Of note he did have a trauma to his posterior buttocks, resulting in a complex wound that had a methicillin resistant staph aureus infection in the remote past.  Past Medical History  Diagnosis Date  . Glaucoma   . Asthma     has had since he was a child, states he only uses the inhaler PRN  . Pneumonia     in hosp.,[facility] ( /w pelvic fx)-   . Arthritis     L&R hip & pelvis   . History of blood transfusion     /w pelvic fx.   . Glaucoma     both eyes   Past Surgical History  Procedure Laterality Date  . Pelvic fracture surgery  1995  . Fracture surgery    . Hernia repair  5809    umbilical   . Back cyst  2006    surgery x4 for cyst on the back    History   Social History  . Marital Status: Divorced    Spouse Name: N/A    Number of Children: N/A  . Years of Education: N/A   Social History Main Topics  . Smoking status: Never Smoker   . Smokeless tobacco: None  . Alcohol Use: Yes     Comment: occasional glass of wine  . Drug Use: No  . Sexual Activity: Yes   Other Topics Concern  . None   Social History Narrative  . None   Family History  Problem Relation Age of Onset  . Hypertension Mother   . Diabetes Father   . Hypertension Sister   . Cancer Maternal Grandmother   . Hypertension Sister    Allergies  Allergen Reactions  . Shellfish Allergy Anaphylaxis and Swelling    Throat swells closed," like there is a block of cement in my throat"   . Trazodone And Nefazodone Anxiety    Prior to Admission medications   Medication Sig Start Date End Date Taking? Authorizing Provider  aspirin EC 81 MG tablet Take 81 mg by mouth daily.   Yes Historical Provider, MD  CINNAMON PO Take 1 capsule by mouth daily.   Yes Historical Provider, MD  Cranberry 500 MG CAPS Take 500 mg by mouth daily.   Yes Historical Provider, MD  fish oil-omega-3 fatty acids 1000 MG capsule Take 1 g by mouth daily.   Yes Historical Provider, MD  Fluticasone-Salmeterol (ADVAIR DISKUS) 250-50 MCG/DOSE AEPB Inhale 1 puff into the lungs 2 (two) times daily. 09/01/14  Yes Posey Boyer, MD  Glucosamine HCl 1000 MG TABS Take 1,000 mg by mouth 2 (two) times daily.   Yes Historical Provider, MD  L-ARGININE PO Take 2 tablets by mouth 2 (two) times daily.   Yes Historical Provider, MD  Melatonin 10 MG TABS Take 10 mg by mouth at bedtime.   Yes Historical Provider, MD  Multiple Vitamin (MULITIVITAMIN WITH MINERALS) TABS Take 1 tablet by mouth daily.   Yes Historical Provider, MD  Saw Palmetto, Serenoa repens, 1000 MG CAPS Take 1 capsule by mouth.  Yes Historical Provider, MD  sildenafil (VIAGRA) 25 MG tablet Take 2 tablets (50 mg total) by mouth daily as needed. For erectile dysfunction 02/24/14  Yes Posey Boyer, MD  Travoprost, BAK Free, (TRAVATAN) 0.004 % SOLN ophthalmic solution Place 1 drop into both eyes at bedtime.    Yes Historical Provider, MD  Turmeric Curcumin 500 MG CAPS Take 500 mg by mouth daily.   Yes Historical Provider, MD  valACYclovir (VALTREX) 500 MG tablet Take 1 tablet (500 mg total) by mouth 2 (two) times daily. For 3 days per each outbreak 02/24/14  Yes Posey Boyer, MD  Valerian Root 450 MG CAPS Take 1,350 mg by mouth at bedtime.   Yes Historical Provider, MD  zolpidem (AMBIEN) 5 MG tablet Take 2.5-5 mg by mouth at bedtime as needed for sleep.   Yes Historical Provider, MD     Positive ROS: All other systems have been reviewed and were otherwise negative with the exception of those  mentioned in the HPI and as above.  Physical Exam: General: Alert, no acute distress Cardiovascular: No pedal edema Respiratory: No cyanosis, no use of accessory musculature GI: No organomegaly, abdomen is soft and non-tender Skin: No lesions in the area of chief complaint Neurologic: Sensation intact distally Psychiatric: Patient is competent for consent with normal mood and affect Lymphatic: No axillary or cervical lymphadenopathy  MUSCULOSKELETAL: Left hip has range of motion 0-80, external rotation is to 10 and internal rotation is limited to 0. He has no skin breaks over his left hip. He has positive crepitance.  Assessment: Left hip osteoarthritis  Plan: Plan for Procedure(s): TOTAL HIP ARTHROPLASTY  The risks benefits and alternatives were discussed with the patient including but not limited to the risks of nonoperative treatment, versus surgical intervention including infection, bleeding, nerve injury, periprosthetic fracture, the need for revision surgery, dislocation, leg length discrepancy, blood clots, cardiopulmonary complications, morbidity, mortality, among others, and they were willing to proceed.   He has risk factors as indicated above, including the previous methicillin-resistant staph aureus infection, and the test is negative on PCR testing, however I will plan to also give him his vancomycin and Ancef.   Johnny Bridge, MD Cell [phone]   10/10/2014 7:11 AM

## 2014-10-10 NOTE — Anesthesia Procedure Notes (Signed)
Procedure Name: Intubation Date/Time: 10/10/2014 7:52 AM Performed by: Scheryl Darter Pre-anesthesia Checklist: Emergency Drugs available, Suction available, Patient identified, Patient being monitored and Timeout performed Patient Re-evaluated:Patient Re-evaluated prior to inductionOxygen Delivery Method: Circle system utilized Preoxygenation: Pre-oxygenation with 100% oxygen Intubation Type: IV induction Ventilation: Mask ventilation without difficulty Laryngoscope Size: Mac and 3 Grade View: Grade I Tube type: Oral Tube size: 7.5 mm Number of attempts: 1 Airway Equipment and Method: Stylet Placement Confirmation: ETT inserted through vocal cords under direct vision,  positive ETCO2 and breath sounds checked- equal and bilateral Secured at: 24 cm Tube secured with: Tape Dental Injury: Teeth and Oropharynx as per pre-operative assessment  Comments: Teeth/crowns intact after intubation as pre-op

## 2014-10-10 NOTE — Discharge Instructions (Signed)
Diet: As you were doing prior to hospitalization  ° °Shower:  May shower but keep the wounds dry, use an occlusive plastic wrap, NO SOAKING IN TUB.  If the bandage gets wet, change with a clean dry gauze. ° °Dressing:  You may change your dressing 3-5 days after surgery.  Then change the dressing daily with sterile gauze dressing.   ° °There are sticky tapes (steri-strips) on your wounds and all the stitches are absorbable.  Leave the steri-strips in place when changing your dressings, they will peel off with time, usually 2-3 weeks. ° °Activity:  Increase activity slowly as tolerated, but follow the weight bearing instructions below.  No lifting or driving for 6 weeks. ° °Weight Bearing:   As tolerated.   ° °To prevent constipation: you may use a stool softener such as - ° °Colace (over the counter) 100 mg by mouth twice a day  °Drink plenty of fluids (prune juice may be helpful) and high fiber foods °Miralax (over the counter) for constipation as needed.   ° °Itching:  If you experience itching with your medications, try taking only a single pain pill, or even half a pain pill at a time.  You may take up to 10 pain pills per day, and you can also use benadryl over the counter for itching or also to help with sleep.  ° °Precautions:  If you experience chest pain or shortness of breath - call 911 immediately for transfer to the hospital emergency department!! ° °If you develop a fever greater that 101 F, purulent drainage from wound, increased redness or drainage from wound, or calf pain -- Call the office at [phone]                                                °Follow- Up Appointment:  Please call for an appointment to be seen in 2 weeks [place] - [phone] ° ° ° ° ° °

## 2014-10-11 ENCOUNTER — Encounter (HOSPITAL_COMMUNITY): Payer: Self-pay | Admitting: Orthopedic Surgery

## 2014-10-11 LAB — CBC
HEMATOCRIT: 32.7 % — AB (ref 39.0–52.0)
HEMOGLOBIN: 11.1 g/dL — AB (ref 13.0–17.0)
MCH: 29.1 pg (ref 26.0–34.0)
MCHC: 33.9 g/dL (ref 30.0–36.0)
MCV: 85.8 fL (ref 78.0–100.0)
Platelets: 142 10*3/uL — ABNORMAL LOW (ref 150–400)
RBC: 3.81 MIL/uL — ABNORMAL LOW (ref 4.22–5.81)
RDW: 14.7 % (ref 11.5–15.5)
WBC: 8.2 10*3/uL (ref 4.0–10.5)

## 2014-10-11 LAB — BASIC METABOLIC PANEL
Anion gap: 10 (ref 5–15)
BUN: 19 mg/dL (ref 6–23)
CALCIUM: 8.8 mg/dL (ref 8.4–10.5)
CO2: 26 mEq/L (ref 19–32)
Chloride: 103 mEq/L (ref 96–112)
Creatinine, Ser: 0.94 mg/dL (ref 0.50–1.35)
GFR calc non Af Amer: 86 mL/min — ABNORMAL LOW (ref >90)
GLUCOSE: 114 mg/dL — AB (ref 70–99)
Potassium: 4.9 mEq/L (ref 3.7–5.3)
SODIUM: 139 meq/L (ref 137–147)

## 2014-10-11 NOTE — Clinical Social Work Placement (Addendum)
Clinical Social Work Department CLINICAL SOCIAL WORK PLACEMENT NOTE 10/11/2014  Patient:  Douglas Russell, Douglas Russell  Account Number:  [id number] Admit date:  10/10/2014  Clinical Social Worker:  Wylene Men  Date/time:  10/11/2014 10:56 AM  Clinical Social Work is seeking post-discharge placement for this patient at the following level of care:   SKILLED NURSING   (*CSW will update this form in Epic as items are completed)   10/11/2014  Patient/family provided with [place] Department of Clinical Social Work's list of facilities offering this level of care within the geographic area requested by the patient (or if unable, by the patient's family).  10/11/2014  Patient/family informed of their freedom to choose among providers that offer the needed level of care, that participate in Medicare, Medicaid or managed care program needed by the patient, have an available bed and are willing to accept the patient.  10/11/2014  Patient/family informed of [facility]' ownership interest in [facility], as well as of the fact that they are under no obligation to receive care at this facility.  PASARR submitted to EDS on 10/11/2014 PASARR number received on 10/11/2014  FL2 transmitted to all facilities in geographic area requested by pt/family on  10/11/2014 FL2 transmitted to all facilities within larger geographic area on   Patient informed that his/her managed care company has contracts with or will negotiate with  certain facilities, including the following:     Patient/family informed of bed offers received:  10/11/2014 Patient chooses bed at [facility] Physician recommends and patient chooses bed at    Patient to be transferred to [place] on  10/13/2014 Patient to be transferred to facility by [facility] Patient and family notified of transfer on 10/13/2014 Name of family member notified:  Pt is alert and oriented stating he will update sister  The following physician  request were entered in Epic:   Additional Comments:   Nonnie Done, [place] [phone]  Psychiatric & Orthopedics (5N 1-16) Clinical Social Worker

## 2014-10-11 NOTE — Anesthesia Postprocedure Evaluation (Signed)
Anesthesia Post Note  Patient: Douglas Russell  Procedure(s) Performed: Procedure(s) (LRB): TOTAL HIP ARTHROPLASTY (Left)  Anesthesia type: general  Patient location: PACU  Post pain: Pain level controlled  Post assessment: Patient's Cardiovascular Status Stable  Last Vitals:  Filed Vitals:   10/11/14 0520  BP: 103/66  Pulse: 73  Temp: 36.5 C  Resp: 16    Post vital signs: Reviewed and stable  Level of consciousness: sedated  Complications: No apparent anesthesia complications

## 2014-10-11 NOTE — Progress Notes (Signed)
Physical Therapy Treatment Patient Details Name: Douglas Russell MRN: [id number] DOB: 1951-03-26 Today's Date: 10/11/2014    History of Present Illness 63 y.o. male s/p left total hip arthroplasty via posterior approach.    PT Comments    Patient continues to progress towards physical therapy goals, able to correctly verbalize 2/3 posterior hip precautions. Slowly ambulates up to 100 feet with close supervision this afternoon. Tolerating therapeutic exercises well. Patient will continue to benefit from skilled physical therapy services to further improve independence with functional mobility.    Follow Up Recommendations  SNF;Supervision for mobility/OOB     Equipment Recommendations  Rolling walker with 5" wheels;3in1 (PT)    Recommendations for Other Services       Precautions / Restrictions Precautions Precautions: Posterior Hip Precaution Comments: Reviewed posterior hip precautions able to recall 2/3 accurately Restrictions Weight Bearing Restrictions: Yes LLE Weight Bearing: Weight bearing as tolerated    Mobility  Bed Mobility                  Transfers Overall transfer level: Needs assistance Equipment used: Rolling walker (2 wheeled) Transfers: Sit to/from Stand Sit to Stand: Supervision         General transfer comment: Supervision for safety with VC for foot placement to prevent bending greater than 90 degrees at hip. Safely demonstrates correct hand placement.  Ambulation/Gait Ambulation/Gait assistance: Supervision Ambulation Distance (Feet): 100 Feet Assistive device: Rolling walker (2 wheeled) Gait Pattern/deviations: Step-through pattern;Decreased stance time - left;Decreased step length - right;Antalgic;Narrow base of support   Gait velocity interpretation: Below normal speed for age/gender General Gait Details: Progressed step-through gait pattern with intermittent cues for sequencing and technique. VC for left glute activation in  stance phase to prevent antalgic gait. No loss of balance noted.  Slowly improving symmetry of gait.   Stairs            Wheelchair Mobility    Modified Rankin (Stroke Patients Only)       Balance                                    Cognition Arousal/Alertness: Awake/alert Behavior During Therapy: WFL for tasks assessed/performed Overall Cognitive Status: Within Functional Limits for tasks assessed                      Exercises Total Joint Exercises Ankle Circles/Pumps: AROM;Both;10 reps;Seated Long Arc Quad: Strengthening;Left;10 reps;Seated Standing Hip Extension: Strengthening;Left;10 reps;Standing Other Exercises Other Exercises: standing knee flexion x10 Lt side Other Exercises: Standing Lt hip abduction x 15    General Comments        Pertinent Vitals/Pain Pain Assessment: 0-10 Pain Score: 2  Pain Location: Lt hip Pain Intervention(s): Monitored during session;Repositioned    Home Living                      Prior Function            PT Goals (current goals can now be found in the care plan section) Acute Rehab PT Goals PT Goal Formulation: With patient Time For Goal Achievement: 10/17/14 Potential to Achieve Goals: Good Progress towards PT goals: Progressing toward goals    Frequency  7X/week    PT Plan Current plan remains appropriate    Co-evaluation             End of Session Equipment Utilized During Treatment:  Gait belt Activity Tolerance: Patient tolerated treatment well Patient left: in chair;with call bell/phone within reach     Time: 1516-1539 PT Time Calculation (min): 23 min  Charges:  $Gait Training: 8-22 mins $Therapeutic Exercise: 8-22 mins                    G Codes:      [employer], [place]  [name] 10/11/2014, 4:07 PM

## 2014-10-11 NOTE — Clinical Social Work Psychosocial (Signed)
Clinical Social Work Department BRIEF PSYCHOSOCIAL ASSESSMENT 10/11/2014  Patient:  Douglas Russell, Douglas Russell     Account Number:  [id number]     Admit date:  10/10/2014  Clinical Social Worker:  Wylene Men  Date/Time:  10/11/2014 10:49 AM  Referred by:  Physician  Date Referred:  10/11/2014 Referred for  SNF Placement  Psychosocial assessment   Other Referral:   none   Interview type:  Patient Other interview type:   none    PSYCHOSOCIAL DATA Living Status:  ALONE Admitted from facility:   Level of care:   Primary support name:  [place] Primary support relationship to patient:  SIBLING Degree of support available:   adequate    CURRENT CONCERNS Current Concerns  Post-Acute Placement   Other Concerns:   none    SOCIAL WORK ASSESSMENT / PLAN PT recommends SNF/STR at time of discharge (DC).  CSW assessed pt at bedside. Pt lying quietly in bed during time of assessment.  Pt states he is agreeable to SNF/STR once medically stable and states he has been in touch with Crystal at [facility], SNF.  Crystal is to complete admission's paperwork at bedside with pt tomorrow (Thursday 10/12/2014).  Per report, MD projected to order DC tomorrow (Thursday 10/12/2014).  Pt is aware and agreeable to these plans. Pt states that he is from home alone and will need to take advantage of SNF once medically discharged.  Pt requests to be transferred via [facility].  CSW contacted Miquel Dunn (spoke with Florentina Jenny) who is agreeable to plans of admission tomorrow and confirms plans for Crystal to complete admission paperwork at bedside (Thursday 10/12/2014 am).   Assessment/plan status:  Psychosocial Support/Ongoing Assessment of Needs Other assessment/ plan:   FL2  PASARR   Information/referral to community resources:   SNF  STR    PATIENT'S/FAMILY'S RESPONSE TO PLAN OF CARE: Pt is agreeable to SNF/STR disposition and appreciative of CSW assistance and support.       Nonnie Done, [place] [phone]  Psychiatric & Orthopedics (5N 1-16) Clinical Social Worker

## 2014-10-11 NOTE — Progress Notes (Signed)
Physical Therapy Treatment Patient Details Name: Douglas Russell MRN: [id number] DOB: April 25, 1951 Today's Date: 10/11/2014    History of Present Illness 63 y.o. male s/p left total hip arthroplasty via posterior approach.    PT Comments    Patient is progressing towards physical therapy goals, ambulating up to 90 feet with min guard assist while using a rolling walker. Reviewed therapeutic exercises and posterior hip precautions. Patient will continue to benefit from skilled physical therapy services to further improve independence with functional mobility.     Follow Up Recommendations  SNF;Supervision for mobility/OOB     Equipment Recommendations  Rolling walker with 5" wheels;3in1 (PT)    Recommendations for Other Services       Precautions / Restrictions Precautions Precautions: Posterior Hip Precaution Comments: Reviewed posterior hip precautions Restrictions Weight Bearing Restrictions: Yes LLE Weight Bearing: Weight bearing as tolerated    Mobility  Bed Mobility                  Transfers Overall transfer level: Needs assistance Equipment used: Rolling walker (2 wheeled) Transfers: Sit to/from Stand Sit to Stand: Min guard         General transfer comment: VC for hand and foot placement to maintain hip precautions. Good stability upon standing.  Ambulation/Gait Ambulation/Gait assistance: Min guard Ambulation Distance (Feet): 90 Feet Assistive device: Rolling walker (2 wheeled) Gait Pattern/deviations: Step-to pattern;Step-through pattern;Decreased step length - right;Decreased step length - left;Decreased stride length;Antalgic;Trunk flexed;Narrow base of support;Decreased stance time - left   Gait velocity interpretation: Below normal speed for age/gender General Gait Details: Focused on step through gait pattern with intermittent VC to maintain wide base of support and upright posture. No loss of balance noted. Slow and guarded but good  control of RW.   Stairs            Wheelchair Mobility    Modified Rankin (Stroke Patients Only)       Balance                                    Cognition Arousal/Alertness: Awake/alert Behavior During Therapy: WFL for tasks assessed/performed Overall Cognitive Status: Within Functional Limits for tasks assessed                      Exercises Total Joint Exercises Ankle Circles/Pumps: AROM;Both;10 reps;Seated Quad Sets: Strengthening;Both;10 reps;Seated Heel Slides: AAROM;Left;Strengthening;5 reps;Supine Hip ABduction/ADduction: AAROM;Strengthening;Left;10 reps;Supine Long Arc Quad: Strengthening;Left;10 reps;Seated    General Comments        Pertinent Vitals/Pain Pain Assessment: 0-10 Pain Score: 1  Pain Location: Lt hip Pain Intervention(s): Monitored during session;Premedicated before session;Repositioned    Home Living                      Prior Function            PT Goals (current goals can now be found in the care plan section) Acute Rehab PT Goals PT Goal Formulation: With patient Time For Goal Achievement: 10/17/14 Potential to Achieve Goals: Good Progress towards PT goals: Progressing toward goals    Frequency  7X/week    PT Plan Current plan remains appropriate    Co-evaluation             End of Session Equipment Utilized During Treatment: Gait belt Activity Tolerance: Patient tolerated treatment well Patient left: in chair;with call bell/phone within reach  Time: 1135-1202 PT Time Calculation (min): 27 min  Charges:  $Gait Training: 8-22 mins $Therapeutic Exercise: 8-22 mins                    G Codes:      [employer], [place]  [name] 10/11/2014, 12:54 PM

## 2014-10-11 NOTE — Progress Notes (Signed)
     Subjective:  Patient reports pain as moderate.  No complaints.  Objective:   VITALS:   Filed Vitals:   10/11/14 0000 10/11/14 0054 10/11/14 0400 10/11/14 0520  BP:  115/68  103/66  Pulse:  78  73  Temp:  97.4 F (36.3 C)  97.7 F (36.5 C)  TempSrc:  Oral  Oral  Resp: 16 16 16 16   Height:      Weight:      SpO2: 100% 100% 100% 100%    Neurologically intact Sensation intact distally Dorsiflexion/Plantar flexion intact Incision: scant drainage   Lab Results  Component Value Date   WBC 8.2 10/11/2014   HGB 11.1* 10/11/2014   HCT 32.7* 10/11/2014   MCV 85.8 10/11/2014   PLT 142* 10/11/2014   BMET    Component Value Date/Time   NA 139 10/11/2014 0500   K 4.9 10/11/2014 0500   CL 103 10/11/2014 0500   CO2 26 10/11/2014 0500   GLUCOSE 114* 10/11/2014 0500   BUN 19 10/11/2014 0500   CREATININE 0.94 10/11/2014 0500   CREATININE 0.88 04/05/2014 2004   CALCIUM 8.8 10/11/2014 0500   GFRNONAA 86* 10/11/2014 0500   GFRAA >90 10/11/2014 0500     Assessment/Plan: 1 Day Post-Op   Principal Problem:   Osteoarthritis of left hip Active Problems:   Hip arthritis   Advance diet Up with therapy Discharge to SNF Possibly tomorrow.   Bernadett Milian P 10/11/2014, 7:27 AM   Marchia Bond, MD Cell [phone]

## 2014-10-12 DIAGNOSIS — M25552 Pain in left hip: Secondary | ICD-10-CM | POA: Diagnosis not present

## 2014-10-12 DIAGNOSIS — Z471 Aftercare following joint replacement surgery: Secondary | ICD-10-CM | POA: Diagnosis not present

## 2014-10-12 DIAGNOSIS — Z9660 Presence of unspecified orthopedic joint implant: Secondary | ICD-10-CM | POA: Diagnosis not present

## 2014-10-12 DIAGNOSIS — M6281 Muscle weakness (generalized): Secondary | ICD-10-CM | POA: Diagnosis not present

## 2014-10-12 DIAGNOSIS — M1612 Unilateral primary osteoarthritis, left hip: Secondary | ICD-10-CM | POA: Diagnosis not present

## 2014-10-12 DIAGNOSIS — R262 Difficulty in walking, not elsewhere classified: Secondary | ICD-10-CM | POA: Diagnosis not present

## 2014-10-12 DIAGNOSIS — Z96642 Presence of left artificial hip joint: Secondary | ICD-10-CM | POA: Diagnosis not present

## 2014-10-12 DIAGNOSIS — Z9889 Other specified postprocedural states: Secondary | ICD-10-CM | POA: Diagnosis not present

## 2014-10-12 DIAGNOSIS — J4520 Mild intermittent asthma, uncomplicated: Secondary | ICD-10-CM | POA: Diagnosis not present

## 2014-10-12 DIAGNOSIS — R278 Other lack of coordination: Secondary | ICD-10-CM | POA: Diagnosis not present

## 2014-10-12 LAB — BASIC METABOLIC PANEL WITH GFR
Anion gap: 11 (ref 5–15)
BUN: 15 mg/dL (ref 6–23)
CO2: 24 meq/L (ref 19–32)
Calcium: 8.8 mg/dL (ref 8.4–10.5)
Chloride: 105 meq/L (ref 96–112)
Creatinine, Ser: 0.7 mg/dL (ref 0.50–1.35)
GFR calc Af Amer: >90 mL/min
GFR calc non Af Amer: >90 mL/min
Glucose, Bld: 113 mg/dL — ABNORMAL HIGH (ref 70–99)
Potassium: 4.5 meq/L (ref 3.7–5.3)
Sodium: 140 meq/L (ref 137–147)

## 2014-10-12 LAB — CBC
HCT: 28.4 % — ABNORMAL LOW (ref 39.0–52.0)
Hemoglobin: 9.8 g/dL — ABNORMAL LOW (ref 13.0–17.0)
MCH: 29.4 pg (ref 26.0–34.0)
MCHC: 34.5 g/dL (ref 30.0–36.0)
MCV: 85.3 fL (ref 78.0–100.0)
Platelets: 140 K/uL — ABNORMAL LOW (ref 150–400)
RBC: 3.33 MIL/uL — ABNORMAL LOW (ref 4.22–5.81)
RDW: 14.7 % (ref 11.5–15.5)
WBC: 9.1 K/uL (ref 4.0–10.5)

## 2014-10-12 NOTE — Progress Notes (Signed)
     Subjective:  Patient reports pain as mild.  Doing well and reading to go to SNF.  Up walking the halls yesterday.  Awaiting breakfast.  Objective:   VITALS:   Filed Vitals:   10/11/14 1600 10/11/14 2030 10/11/14 2048 10/12/14 0539  BP:   116/64 114/69  Pulse:   78 72  Temp:   98.9 F (37.2 C) 97.8 F (36.6 C)  TempSrc:      Resp: 18  18 18   Height:      Weight:      SpO2: 100% 100% 100% 100%    General: NAD, resting comfortably in bed  MSK: +EHL/FHL Neuro: Sensation intact Chest: RRR, no M/R/G Skin/Dressing: C/D/I   Lab Results  Component Value Date   WBC 9.1 10/12/2014   HGB 9.8* 10/12/2014   HCT 28.4* 10/12/2014   MCV 85.3 10/12/2014   PLT 140* 10/12/2014   BMET    Component Value Date/Time   NA 140 10/12/2014 0523   K 4.5 10/12/2014 0523   CL 105 10/12/2014 0523   CO2 24 10/12/2014 0523   GLUCOSE 113* 10/12/2014 0523   BUN 15 10/12/2014 0523   CREATININE 0.70 10/12/2014 0523   CREATININE 0.88 04/05/2014 2004   CALCIUM 8.8 10/12/2014 0523   GFRNONAA >90 10/12/2014 0523   GFRAA >90 10/12/2014 0523     Assessment/Plan: 2 Days Post-Op   Principal Problem:   Osteoarthritis of left hip Active Problems:   Hip arthritis   Advance diet Up with therapy Discharge to SNF    Luanna Cole 10/12/2014, 7:21 AM  Discussed and agree snf today Marchia Bond, MD Cell [phone]

## 2014-10-12 NOTE — Progress Notes (Signed)
Physical Therapy Treatment Patient Details Name: Douglas Russell MRN: [id number] DOB: 03/29/1951 Today's Date: 10/12/2014    History of Present Illness 63 y.o. male s/p left total hip arthroplasty via posterior approach.    PT Comments    Patient continues to progress well towards physical therapy goals. Slowly improving gait mechanics and safety with mobility. Correctly recalls 2/3 posterior hip precautions but maintains 3/3 precautions throughout therapy session. Patient will continue to benefit from skilled physical therapy services to further improve independence with functional mobility.   Follow Up Recommendations  SNF;Supervision for mobility/OOB     Equipment Recommendations  Rolling walker with 5" wheels;3in1 (PT)    Recommendations for Other Services       Precautions / Restrictions Precautions Precautions: Posterior Hip Precaution Comments: Reviewed posterior hip precautions able to recall 2/3 accurately Restrictions Weight Bearing Restrictions: Yes LLE Weight Bearing: Weight bearing as tolerated    Mobility  Bed Mobility                  Transfers Overall transfer level: Needs assistance Equipment used: Rolling walker (2 wheeled) Transfers: Sit to/from Stand Sit to Stand: Supervision         General transfer comment: Supervision for safety. Able to safely place LLE in correct position to maintain posterior hip precautions while rising. performed from recliner x3. good stability upon standing.  Ambulation/Gait Ambulation/Gait assistance: Supervision Ambulation Distance (Feet): 100 Feet (x2) Assistive device: Rolling walker (2 wheeled) Gait Pattern/deviations: Step-through pattern;Decreased step length - left;Decreased stance time - right;Ataxic;Trunk flexed   Gait velocity interpretation: Below normal speed for age/gender General Gait Details: Continues to show mild antalgic gait. VC for upright posture inttermittently. Progressed with  step-through gait pattern and instructions for symmetry of gait. No loss of balance. Speed slowly increasing but still somewhat guarded. Required sitting rest break at distance of 100 feet and was able to complete this distance again after a short rest period.   Stairs            Wheelchair Mobility    Modified Rankin (Stroke Patients Only)       Balance                                    Cognition Arousal/Alertness: Awake/alert Behavior During Therapy: WFL for tasks assessed/performed Overall Cognitive Status: Within Functional Limits for tasks assessed                      Exercises Total Joint Exercises Ankle Circles/Pumps: AROM;Both;Seated;15 reps Long Arc Quad: Strengthening;Left;10 reps;Seated Marching in Standing: Strengthening;Left;10 reps;Standing Other Exercises Other Exercises: standing knee flexion x10 Lt side Other Exercises: Standing Lt hip abduction x 10    General Comments        Pertinent Vitals/Pain Pain Assessment: 0-10 Pain Score: 1  Pain Location: Lt hip Pain Intervention(s): Monitored during session    Home Living                      Prior Function            PT Goals (current goals can now be found in the care plan section) Acute Rehab PT Goals PT Goal Formulation: With patient Time For Goal Achievement: 10/17/14 Potential to Achieve Goals: Good Progress towards PT goals: Progressing toward goals    Frequency  7X/week    PT Plan Current plan remains appropriate  Co-evaluation             End of Session Equipment Utilized During Treatment: Gait belt Activity Tolerance: Patient tolerated treatment well Patient left: in chair;with call bell/phone within reach     Time: 1132-1155 PT Time Calculation (min): 23 min  Charges:  $Gait Training: 8-22 mins $Therapeutic Exercise: 8-22 mins                    G Codes:      [employer], [place]  [name] 10/12/2014, 1:39 PM

## 2014-10-12 NOTE — Care Management Note (Signed)
CARE MANAGEMENT NOTE 10/12/2014  Patient:  KHAN, CHURA   Account Number:  [id number]  Date Initiated:  10/11/2014  Documentation initiated by:  Ricki Miller  Subjective/Objective Assessment:   63 yr old male admitted with DJD of left hip, underwent Left total hip arthroplasty.     Action/Plan:   Patient will need shortterm rehab at SNF, will go to [facility].   Anticipated DC Date:  10/12/2014   Anticipated DC Plan:  SKILLED NURSING FACILITY  In-house referral  Clinical Social Worker      DC Planning Services  CM consult      [facility] Choice  NA   Choice offered to / List presented to:     DME arranged  NA        [place] arranged  NA      Status of service:  Completed, signed off Medicare Important Message given?  NA - LOS <3 / Initial given by admissions (If response is "NO", the following Medicare IM given date fields will be blank) Date Medicare IM given:   Medicare IM given by:   Date Additional Medicare IM given:   Additional Medicare IM given by:    Discharge Disposition:  [place]  Per UR Regulation:  Reviewed for med. necessity/level of care/duration of stay

## 2014-10-12 NOTE — Discharge Summary (Signed)
Physician Discharge Summary  Patient ID: Douglas Russell MRN: [id number] DOB/AGE: 07/02/1951 63 y.o.  Admit date: 10/10/2014 Discharge date: 10/12/2014  Admission Diagnoses:  Osteoarthritis of left hip  Discharge Diagnoses:  Principal Problem:   Osteoarthritis of left hip Active Problems:   Hip arthritis   Past Medical History  Diagnosis Date  . Glaucoma   . Asthma     has had since he was a child, states he only uses the inhaler PRN  . Pneumonia     in hosp.,[facility] ( /w pelvic fx)-   . Arthritis     L&R hip & pelvis   . History of blood transfusion     /w pelvic fx.   . Glaucoma     both eyes  . Osteoarthritis of left hip 10/10/2014    Surgeries: Procedure(s): TOTAL HIP ARTHROPLASTY on 10/10/2014   Consultants (if any):    Discharged Condition: Improved  Hospital Course: Douglas Russell is an 63 y.o. male who was admitted 10/10/2014 with a diagnosis of Osteoarthritis of left hip and went to the operating room on 10/10/2014 and underwent the above named procedures.    He was given perioperative antibiotics:  Anti-infectives   Start     Dose/Rate Route Frequency Ordered Stop   10/10/14 1300  ceFAZolin (ANCEF) IVPB 2 g/50 mL premix     2 g 100 mL/hr over 30 Minutes Intravenous Every 6 hours 10/10/14 1122 10/10/14 1906   10/10/14 0715  vancomycin (VANCOCIN) IVPB 1000 mg/200 mL premix     1,000 mg 200 mL/hr over 60 Minutes Intravenous  Once 10/10/14 0710 10/10/14 0805   10/10/14 0600  ceFAZolin (ANCEF) IVPB 2 g/50 mL premix     2 g 100 mL/hr over 30 Minutes Intravenous On call to O.R. 10/09/14 1424 10/10/14 0754    .  He was given sequential compression devices, early ambulation, and xarelto for DVT prophylaxis.  He benefited maximally from the hospital stay and there were no complications.    Recent vital signs:  Filed Vitals:   10/12/14 0539  BP: 114/69  Pulse: 72  Temp: 97.8 F (36.6 C)  Resp: 18    Recent laboratory studies:  Lab Results   Component Value Date   HGB 9.8* 10/12/2014   HGB 11.1* 10/11/2014   HGB 14.1 09/26/2014   Lab Results  Component Value Date   WBC 9.1 10/12/2014   PLT 140* 10/12/2014   Lab Results  Component Value Date   INR 1.08 09/26/2014   Lab Results  Component Value Date   NA 140 10/12/2014   K 4.5 10/12/2014   CL 105 10/12/2014   CO2 24 10/12/2014   BUN 15 10/12/2014   CREATININE 0.70 10/12/2014   GLUCOSE 113* 10/12/2014    Discharge Medications:     Medication List         aspirin EC 81 MG tablet  Take 81 mg by mouth daily.     baclofen 10 MG tablet  Commonly known as:  LIORESAL  Take 1 tablet (10 mg total) by mouth 3 (three) times daily. As needed for muscle spasm     CINNAMON PO  Take 1 capsule by mouth daily.     Cranberry 500 MG Caps  Take 500 mg by mouth daily.     fish oil-omega-3 fatty acids 1000 MG capsule  Take 1 g by mouth daily.     Fluticasone-Salmeterol 250-50 MCG/DOSE Aepb  Commonly known as:  ADVAIR DISKUS  Inhale 1 puff  into the lungs 2 (two) times daily.     Glucosamine HCl 1000 MG Tabs  Take 1,000 mg by mouth 2 (two) times daily.     L-ARGININE PO  Take 2 tablets by mouth 2 (two) times daily.     Melatonin 10 MG Tabs  Take 10 mg by mouth at bedtime.     multivitamin with minerals Tabs tablet  Take 1 tablet by mouth daily.     ondansetron 4 MG tablet  Commonly known as:  ZOFRAN  Take 1 tablet (4 mg total) by mouth every 8 (eight) hours as needed for nausea or vomiting.     oxyCODONE-acetaminophen 10-325 MG per tablet  Commonly known as:  PERCOCET  Take 1-2 tablets by mouth every 6 (six) hours as needed for pain. MAXIMUM TOTAL ACETAMINOPHEN DOSE IS 4000 MG PER DAY     rivaroxaban 10 MG Tabs tablet  Commonly known as:  XARELTO  Take 1 tablet (10 mg total) by mouth daily.     Saw Palmetto (Serenoa repens) 1000 MG Caps  Take 1 capsule by mouth.     sennosides-docusate sodium 8.6-50 MG tablet  Commonly known as:  SENOKOT-S  Take 2  tablets by mouth daily.     sildenafil 25 MG tablet  Commonly known as:  VIAGRA  Take 2 tablets (50 mg total) by mouth daily as needed. For erectile dysfunction     Travoprost (BAK Free) 0.004 % Soln ophthalmic solution  Commonly known as:  TRAVATAN  Place 1 drop into both eyes at bedtime.     Turmeric Curcumin 500 MG Caps  Take 500 mg by mouth daily.     valACYclovir 500 MG tablet  Commonly known as:  VALTREX  Take 1 tablet (500 mg total) by mouth 2 (two) times daily. For 3 days per each outbreak     Valerian Root 450 MG Caps  Take 1,350 mg by mouth at bedtime.     zolpidem 5 MG tablet  Commonly known as:  AMBIEN  Take 2.5-5 mg by mouth at bedtime as needed for sleep.        Diagnostic Studies: Dg Pelvis Portable  10/10/2014   CLINICAL DATA:  Postop left hip.  EXAM: PORTABLE PELVIS 1-2 VIEWS  COMPARISON:  07/26/2014  FINDINGS: Patient has undergone left total hip arthroplasty. Expected postoperative soft tissue gas identified. Patient is rotated. On this frontal view there is no evidence for dislocation. No acute fracture identified. Remote right SI joint fusion.  IMPRESSION: 1. Status post left hip arthroplasty. 2. No adverse features identified.   Electronically Signed   By: Shon Hale M.D.   On: 10/10/2014 11:16   Dg Hip Portable 1 View Left  10/10/2014   CLINICAL DATA:  Postop left hip in PACU.  EXAM: PORTABLE LEFT HIP - 1 VIEW  COMPARISON:  10/10/2014, 07/26/2014  FINDINGS: Cross-table lateral is performed to assess location of the replaced left hip. Hardware and left hip are only partially seen because of the portable technique. Consider follow-up non portable exam as needed. No adverse features identified.  IMPRESSION: Status post arthroplasty. Study is limited for evaluation of location of the hip.   Electronically Signed   By: Shon Hale M.D.   On: 10/10/2014 11:40    Disposition: 01-Home or Self Care        Follow-up Information   Follow up with Johnny Bridge, MD. Schedule an appointment as soon as possible for a visit in 2 weeks.   Specialty:  Orthopedic Surgery   Contact information:   [place] [address] [phone]        Signed: Johnny Bridge 10/12/2014, 11:40 AM

## 2014-10-13 ENCOUNTER — Non-Acute Institutional Stay (SKILLED_NURSING_FACILITY): Payer: Medicare Other | Admitting: Adult Health

## 2014-10-13 ENCOUNTER — Encounter: Payer: Self-pay | Admitting: Adult Health

## 2014-10-13 DIAGNOSIS — J4520 Mild intermittent asthma, uncomplicated: Secondary | ICD-10-CM | POA: Diagnosis not present

## 2014-10-13 DIAGNOSIS — Z96642 Presence of left artificial hip joint: Secondary | ICD-10-CM

## 2014-10-13 DIAGNOSIS — Z9660 Presence of unspecified orthopedic joint implant: Secondary | ICD-10-CM

## 2014-10-13 DIAGNOSIS — M1612 Unilateral primary osteoarthritis, left hip: Secondary | ICD-10-CM

## 2014-10-13 DIAGNOSIS — J45909 Unspecified asthma, uncomplicated: Secondary | ICD-10-CM | POA: Insufficient documentation

## 2014-10-13 NOTE — Progress Notes (Signed)
Patient ID: Douglas Russell, male   DOB: 1951/05/03, 63 y.o.   MRN: [id number]     [facility]  Allergies  Allergen Reactions  . Shellfish Allergy Anaphylaxis and Swelling    Throat swells closed," like there is a block of cement in my throat"   . Trazodone And Nefazodone Anxiety     Chief Complaint  Patient presents with  . Hospitalization Follow-up    HPI:  He has been hospitalized for a left hip replacement due to osteoarthritis. He is here for short term rehab and will return home in 1-2 weeks. He is not complaining of pain status his current regimen is effective.    Past Medical History  Diagnosis Date  . Glaucoma   . Asthma     has had since he was a child, states he only uses the inhaler PRN  . Pneumonia     in hosp[facility] ( /w pelvic fx)-   . Arthritis     L&R hip & pelvis   . History of blood transfusion     /w pelvic fx.   . Glaucoma     both eyes  . Osteoarthritis of left hip 10/10/2014    Past Surgical History  Procedure Laterality Date  . Pelvic fracture surgery  1995  . Fracture surgery    . Hernia repair  3151    umbilical   . Back cyst  2006    surgery x4 for cyst on the back   . Total hip arthroplasty Left 10/10/2014    dr Mardelle Matte  . Total hip arthroplasty Left 10/10/2014    Procedure: TOTAL HIP ARTHROPLASTY;  Surgeon: Johnny Bridge, MD;  Location: [place] [facility];  Service: Orthopedics;  Laterality: Left;    VITAL SIGNS BP 104/62  Pulse 77  Ht 6' (1.829 m)  Wt 191 lb (86.637 kg)  BMI 25.90 kg/m2   Patient's Medications  New Prescriptions   No medications on file  Previous Medications   ASPIRIN EC 81 MG TABLET    Take 81 mg by mouth daily.   BACLOFEN (LIORESAL) 10 MG TABLET    Take 1 tablet (10 mg total) by mouth 3 (three) times daily. As needed for muscle spasm   CINNAMON PO    Take 1 capsule by mouth daily.   CRANBERRY 500 MG CAPS    Take 500 mg by mouth daily.   FISH OIL-OMEGA-3 FATTY ACIDS 1000 MG CAPSULE    Take 1 g by mouth daily.   FLUTICASONE-SALMETEROL (ADVAIR DISKUS) 250-50 MCG/DOSE AEPB    Inhale 1 puff into the lungs 2 (two) times daily.   GLUCOSAMINE HCL 1000 MG TABS    Take 1,000 mg by mouth 2 (two) times daily.   L-ARGININE PO    Take 2 tablets by mouth 2 (two) times daily.   MELATONIN 10 MG TABS    Take 10 mg by mouth at bedtime.   MULTIPLE VITAMIN (MULITIVITAMIN WITH MINERALS) TABS    Take 1 tablet by mouth daily.   ONDANSETRON (ZOFRAN) 4 MG TABLET    Take 1 tablet (4 mg total) by mouth every 8 (eight) hours as needed for nausea or vomiting.   OXYCODONE-ACETAMINOPHEN (PERCOCET) 10-325 MG PER TABLET    Take 1-2 tablets by mouth every 6 (six) hours as needed for pain. MAXIMUM TOTAL ACETAMINOPHEN DOSE IS 4000 MG PER DAY   RIVAROXABAN (XARELTO) 10 MG TABS TABLET    Take 1 tablet (10 mg total) by mouth daily.   SAW  PALMETTO, SERENOA REPENS, 1000 MG CAPS    Take 1 capsule by mouth.   SENNOSIDES-DOCUSATE SODIUM (SENOKOT-S) 8.6-50 MG TABLET    Take 2 tablets by mouth daily.   SILDENAFIL (VIAGRA) 25 MG TABLET    Take 2 tablets (50 mg total) by mouth daily as needed. For erectile dysfunction   TRAVOPROST, BAK FREE, (TRAVATAN) 0.004 % SOLN OPHTHALMIC SOLUTION    Place 1 drop into both eyes at bedtime.    TURMERIC CURCUMIN 500 MG CAPS    Take 500 mg by mouth daily.   VALACYCLOVIR (VALTREX) 500 MG TABLET    Take 1 tablet (500 mg total) by mouth 2 (two) times daily. For 3 days per each outbreak   VALERIAN ROOT 450 MG CAPS    Take 1,350 mg by mouth at bedtime.   ZOLPIDEM (AMBIEN) 5 MG TABLET    Take 2.5-5 mg by mouth at bedtime as needed for sleep.  Modified Medications   No medications on file  Discontinued Medications   No medications on file    SIGNIFICANT DIAGNOSTIC EXAMS  10-10-14: pelvic x-ray: 1. Status post left hip arthroplasty. 2. No adverse features identified.  10-10-14: left hip x-ray: Status post arthroplasty. Study is limited for evaluation of location of the hip.    LABS REVIEWED:   10-11-14: wbc  8.2;hgb 11.1; hct 32.7; mcv 85.8; plt 142; glucose 114; bun 19; creat 0.94; k+4.9; na++139 10-12-14: wbc 9.1; hgb 9.8; hct 28.4; mcv 85.3; plt 140; glucose 113; bun 15; creat 0.7; k+4.5 ;na++140     Review of Systems  Constitutional: Negative for malaise/fatigue.  Respiratory: Negative for cough and shortness of breath.   Cardiovascular: Negative for chest pain, palpitations and leg swelling.  Gastrointestinal: Negative for heartburn, abdominal pain and constipation.  Musculoskeletal: Negative for joint pain and myalgias.  Skin: Negative.   Neurological: Negative for headaches.  Psychiatric/Behavioral: Negative for depression. The patient is not nervous/anxious.      Physical Exam  Constitutional: He is oriented to person, place, and time. He appears well-developed and well-nourished. No distress.  Neck: Neck supple. No JVD present.  Cardiovascular: Normal rate, regular rhythm and intact distal pulses.   Respiratory: Effort normal and breath sounds normal. No respiratory distress. He has no wheezes.  GI: Soft. Bowel sounds are normal. He exhibits no distension. There is no tenderness.  Musculoskeletal: He exhibits no edema.  Is able to move all extremities; is status post left hip replacement   Neurological: He is alert and oriented to person, place, and time.  Skin: Skin is warm and dry. He is not diaphoretic.  Left hip incision line without signs of infection present       ASSESSMENT/ PLAN:  1. Osteoarthritis left hip: is status post left hip replacement; will continue therapy as directed; will follow up with orthopedics as directed; will continue baclofen 10 mg three times daily prn and percocet 10/325 mg 1 or 2 tabs every 6 hours as needed for pain. Will continue xarelto 10 mg daily for total 21 days and will monitor his status.   2. Chronic asthma: is stable will continue advair 250/50 twice daily and will monitor   3. Constipation: is stable will continue senna s 2 tabs  daily;    Time spent with patient 50 minutes  Ok Edwards NP [facility] Adult Medicine  Contact [phone] Monday through Friday 8am- 5pm  After hours call [phone]

## 2014-10-16 ENCOUNTER — Non-Acute Institutional Stay (SKILLED_NURSING_FACILITY): Payer: Medicare Other | Admitting: Adult Health

## 2014-10-16 DIAGNOSIS — J4520 Mild intermittent asthma, uncomplicated: Secondary | ICD-10-CM | POA: Diagnosis not present

## 2014-10-16 DIAGNOSIS — M1612 Unilateral primary osteoarthritis, left hip: Secondary | ICD-10-CM | POA: Diagnosis not present

## 2014-10-16 DIAGNOSIS — Z96642 Presence of left artificial hip joint: Secondary | ICD-10-CM

## 2014-10-16 DIAGNOSIS — Z9660 Presence of unspecified orthopedic joint implant: Secondary | ICD-10-CM

## 2014-10-19 DIAGNOSIS — H409 Unspecified glaucoma: Secondary | ICD-10-CM

## 2014-10-19 DIAGNOSIS — Z96642 Presence of left artificial hip joint: Secondary | ICD-10-CM

## 2014-10-19 DIAGNOSIS — M1612 Unilateral primary osteoarthritis, left hip: Secondary | ICD-10-CM

## 2014-10-19 DIAGNOSIS — Z471 Aftercare following joint replacement surgery: Secondary | ICD-10-CM | POA: Diagnosis not present

## 2014-10-23 DIAGNOSIS — H409 Unspecified glaucoma: Secondary | ICD-10-CM | POA: Diagnosis not present

## 2014-10-23 DIAGNOSIS — M1612 Unilateral primary osteoarthritis, left hip: Secondary | ICD-10-CM | POA: Diagnosis not present

## 2014-10-23 DIAGNOSIS — Z471 Aftercare following joint replacement surgery: Secondary | ICD-10-CM | POA: Diagnosis not present

## 2014-10-23 DIAGNOSIS — Z96642 Presence of left artificial hip joint: Secondary | ICD-10-CM | POA: Diagnosis not present

## 2014-10-24 DIAGNOSIS — M1612 Unilateral primary osteoarthritis, left hip: Secondary | ICD-10-CM | POA: Diagnosis not present

## 2014-10-24 NOTE — Progress Notes (Signed)
Patient ID: Douglas Russell, male   DOB: 1951-10-26, 63 y.o.   MRN: [id number]    [facility]  Allergies  Allergen Reactions  . Shellfish Allergy Anaphylaxis and Swelling    Throat swells closed," like there is a block of cement in my throat"   . Trazodone And Nefazodone Anxiety     Chief Complaint  Patient presents with  . Discharge Note    HPI:  He is being discharged to home with home health for pt/ot. He will need a front wheel walker. He will need his prescriptions to be written. He will need a follow up with his pcp. He had been hospitalized for a left hip replacement and is now ready to return back home.    Past Medical History  Diagnosis Date  . Glaucoma   . Asthma     has had since he was a child, states he only uses the inhaler PRN  . Pneumonia     in hosp.,[facility] ( /w pelvic fx)-   . Arthritis     L&R hip & pelvis   . History of blood transfusion     /w pelvic fx.   . Glaucoma     both eyes  . Osteoarthritis of left hip 10/10/2014    Past Surgical History  Procedure Laterality Date  . Pelvic fracture surgery  1995  . Fracture surgery    . Hernia repair  9735    umbilical   . Back cyst  2006    surgery x4 for cyst on the back   . Total hip arthroplasty Left 10/10/2014    dr Mardelle Matte  . Total hip arthroplasty Left 10/10/2014    Procedure: TOTAL HIP ARTHROPLASTY;  Surgeon: Johnny Bridge, MD;  Location: [place];  Service: Orthopedics;  Laterality: Left;    VITAL SIGNS BP 129/89  Pulse 79  Ht 6' (1.829 m)  Wt 191 lb (86.637 kg)  BMI 25.90 kg/m2   Patient's Medications  New Prescriptions   No medications on file  Previous Medications   ASPIRIN EC 81 MG TABLET    Take 81 mg by mouth daily.   BACLOFEN (LIORESAL) 10 MG TABLET    Take 1 tablet (10 mg total) by mouth 3 (three) times daily. As needed for muscle spasm   CINNAMON PO    Take 1 capsule by mouth daily.   CRANBERRY 500 MG CAPS    Take 500 mg by mouth daily.   FISH OIL-OMEGA-3 FATTY ACIDS  1000 MG CAPSULE    Take 1 g by mouth daily.   FLUTICASONE-SALMETEROL (ADVAIR DISKUS) 250-50 MCG/DOSE AEPB    Inhale 1 puff into the lungs 2 (two) times daily.   GLUCOSAMINE HCL 1000 MG TABS    Take 1,000 mg by mouth 2 (two) times daily.   L-ARGININE PO    Take 2 tablets by mouth 2 (two) times daily.   MELATONIN 10 MG TABS    Take 10 mg by mouth at bedtime.   MULTIPLE VITAMIN (MULITIVITAMIN WITH MINERALS) TABS    Take 1 tablet by mouth daily.   ONDANSETRON (ZOFRAN) 4 MG TABLET    Take 1 tablet (4 mg total) by mouth every 8 (eight) hours as needed for nausea or vomiting.   OXYCODONE-ACETAMINOPHEN (PERCOCET) 10-325 MG PER TABLET    Take 1-2 tablets by mouth every 6 (six) hours as needed for pain. MAXIMUM TOTAL ACETAMINOPHEN DOSE IS 4000 MG PER DAY   RIVAROXABAN (XARELTO) 10 MG TABS TABLET  Take 1 tablet (10 mg total) by mouth daily.   SAW PALMETTO, SERENOA REPENS, 1000 MG CAPS    Take 1 capsule by mouth.   SENNOSIDES-DOCUSATE SODIUM (SENOKOT-S) 8.6-50 MG TABLET    Take 2 tablets by mouth daily.   SILDENAFIL (VIAGRA) 25 MG TABLET    Take 2 tablets (50 mg total) by mouth daily as needed. For erectile dysfunction   TRAVOPROST, BAK FREE, (TRAVATAN) 0.004 % SOLN OPHTHALMIC SOLUTION    Place 1 drop into both eyes at bedtime.    TURMERIC CURCUMIN 500 MG CAPS    Take 500 mg by mouth daily.   VALACYCLOVIR (VALTREX) 500 MG TABLET    Take 1 tablet (500 mg total) by mouth 2 (two) times daily. For 3 days per each outbreak   VALERIAN ROOT 450 MG CAPS    Take 1,350 mg by mouth at bedtime.   ZOLPIDEM (AMBIEN) 5 MG TABLET    Take 2.5-5 mg by mouth at bedtime as needed for sleep.  Modified Medications   No medications on file  Discontinued Medications   No medications on file    SIGNIFICANT DIAGNOSTIC EXAMS   10-10-14: pelvic x-ray: 1. Status post left hip arthroplasty. 2. No adverse features identified.  10-10-14: left hip x-ray: Status post arthroplasty. Study is limited for evaluation of location of the  hip.    LABS REVIEWED:   10-11-14: wbc 8.2;hgb 11.1; hct 32.7; mcv 85.8; plt 142; glucose 114; bun 19; creat 0.94; k+4.9; na++139 10-12-14: wbc 9.1; hgb 9.8; hct 28.4; mcv 85.3; plt 140; glucose 113; bun 15; creat 0.7; k+4.5 ;na++140     Review of Systems  Constitutional: Negative for malaise/fatigue.  Respiratory: Negative for cough and shortness of breath.   Cardiovascular: Negative for chest pain, palpitations and leg swelling.  Gastrointestinal: Negative for heartburn, abdominal pain and constipation.  Musculoskeletal: Negative for joint pain and myalgias.  Skin: Negative.   Neurological: Negative for headaches.  Psychiatric/Behavioral: Negative for depression. The patient is not nervous/anxious.      Physical Exam  Constitutional: He is oriented to person, place, and time. He appears well-developed and well-nourished. No distress.  Neck: Neck supple. No JVD present.  Cardiovascular: Normal rate, regular rhythm and intact distal pulses.   Respiratory: Effort normal and breath sounds normal. No respiratory distress. He has no wheezes.  GI: Soft. Bowel sounds are normal. He exhibits no distension. There is no tenderness.  Musculoskeletal: He exhibits no edema.  Is able to move all extremities; is status post left hip replacement   Neurological: He is alert and oriented to person, place, and time.  Skin: Skin is warm and dry. He is not diaphoretic.  Left hip incision line without signs of infection present       ASSESSMENT/ PLAN:  Will discharge to home with home health for pt/ot to improve upon mobility strength independence. He will need a front wheel walker in order to maintain current level of independence with his adl's. His prescriptions have been written for a 30 day supply of his medications with #30 ambien 5 mg tabs and #30  Percocet 10/315 mg tabs He has a follow up appointment with his pcp Douglas Russell on 10-26-14.   Time spent with patient 40 minutes.     Ok Edwards NP [facility] Adult [facility]  Contact [phone] Monday through Friday 8am- 5pm  After hours call [phone]

## 2014-10-25 DIAGNOSIS — M1612 Unilateral primary osteoarthritis, left hip: Secondary | ICD-10-CM | POA: Diagnosis not present

## 2014-10-25 DIAGNOSIS — H409 Unspecified glaucoma: Secondary | ICD-10-CM | POA: Diagnosis not present

## 2014-10-25 DIAGNOSIS — Z96642 Presence of left artificial hip joint: Secondary | ICD-10-CM | POA: Diagnosis not present

## 2014-10-25 DIAGNOSIS — Z471 Aftercare following joint replacement surgery: Secondary | ICD-10-CM | POA: Diagnosis not present

## 2014-10-26 ENCOUNTER — Ambulatory Visit (INDEPENDENT_AMBULATORY_CARE_PROVIDER_SITE_OTHER): Payer: Medicare Other | Admitting: Family Medicine

## 2014-10-26 VITALS — BP 110/68 | HR 83 | Temp 98.9°F | Resp 18 | Ht 72.0 in | Wt 181.0 lb

## 2014-10-26 DIAGNOSIS — Z96642 Presence of left artificial hip joint: Secondary | ICD-10-CM

## 2014-10-26 DIAGNOSIS — J4520 Mild intermittent asthma, uncomplicated: Secondary | ICD-10-CM

## 2014-10-26 DIAGNOSIS — Z9660 Presence of unspecified orthopedic joint implant: Secondary | ICD-10-CM

## 2014-10-26 DIAGNOSIS — G479 Sleep disorder, unspecified: Secondary | ICD-10-CM | POA: Diagnosis not present

## 2014-10-26 MED ORDER — ZOLPIDEM TARTRATE 5 MG PO TABS: 2.5000 mg | ORAL_TABLET | Freq: Every evening | ORAL | Status: DC | PRN

## 2014-10-26 NOTE — Patient Instructions (Signed)
Continue staying active and exercising that hip as they direct you.  Use the Ambien one half to one at bedtime when needed. If you are good and tired and do not need it, avoid using it.  If you're doing well continue current treatments and return in 3-4 months

## 2014-10-26 NOTE — Progress Notes (Signed)
Subjective: 63 year old man well-known to me who is here for follow-up after having had a left total hip arthroplasty done 2 weeks ago. He went to rehabilitation for 5 days after getting out of the hospital. He has been doing well. His range of motion is doing great. He got through with occasional. No breathing problems. He is taking some Ambien and that is helping him rest. He will likes more visit.  Objective: Chest clear. Heart regular without murmurs. Ambulating well with a cane.  Assessment: Status post left total hip arthroplasty Sleep disturbance  Plan: See him back in about 3 months for routine evaluation.

## 2014-10-30 DIAGNOSIS — Z471 Aftercare following joint replacement surgery: Secondary | ICD-10-CM | POA: Diagnosis not present

## 2014-10-30 DIAGNOSIS — Z96642 Presence of left artificial hip joint: Secondary | ICD-10-CM | POA: Diagnosis not present

## 2014-10-30 DIAGNOSIS — H409 Unspecified glaucoma: Secondary | ICD-10-CM | POA: Diagnosis not present

## 2014-10-30 DIAGNOSIS — M1612 Unilateral primary osteoarthritis, left hip: Secondary | ICD-10-CM | POA: Diagnosis not present

## 2014-11-01 DIAGNOSIS — M1612 Unilateral primary osteoarthritis, left hip: Secondary | ICD-10-CM | POA: Diagnosis not present

## 2014-11-01 DIAGNOSIS — Z471 Aftercare following joint replacement surgery: Secondary | ICD-10-CM | POA: Diagnosis not present

## 2014-11-01 DIAGNOSIS — Z96642 Presence of left artificial hip joint: Secondary | ICD-10-CM | POA: Diagnosis not present

## 2014-11-01 DIAGNOSIS — H409 Unspecified glaucoma: Secondary | ICD-10-CM | POA: Diagnosis not present

## 2014-11-03 DIAGNOSIS — H4011X2 Primary open-angle glaucoma, moderate stage: Secondary | ICD-10-CM | POA: Diagnosis not present

## 2014-11-03 DIAGNOSIS — H2513 Age-related nuclear cataract, bilateral: Secondary | ICD-10-CM | POA: Diagnosis not present

## 2014-11-07 ENCOUNTER — Encounter: Payer: Self-pay | Admitting: Internal Medicine

## 2015-01-05 ENCOUNTER — Ambulatory Visit (AMBULATORY_SURGERY_CENTER): Payer: Self-pay | Admitting: *Deleted

## 2015-01-05 VITALS — Ht 72.0 in | Wt 186.2 lb

## 2015-01-05 DIAGNOSIS — Z1211 Encounter for screening for malignant neoplasm of colon: Secondary | ICD-10-CM

## 2015-01-05 NOTE — Progress Notes (Signed)
No allergies to eggs or soy. No problems with anesthesia.  Pt given Emmi instructions for colonoscopy  No oxygen use  No diet drug use  

## 2015-01-17 ENCOUNTER — Ambulatory Visit (AMBULATORY_SURGERY_CENTER): Payer: Medicare Other | Admitting: Internal Medicine

## 2015-01-17 ENCOUNTER — Encounter: Payer: Self-pay | Admitting: Internal Medicine

## 2015-01-17 VITALS — BP 123/67 | HR 65 | Temp 97.8°F | Resp 17 | Ht 72.0 in | Wt 186.0 lb

## 2015-01-17 DIAGNOSIS — Z1211 Encounter for screening for malignant neoplasm of colon: Secondary | ICD-10-CM | POA: Diagnosis not present

## 2015-01-17 DIAGNOSIS — D122 Benign neoplasm of ascending colon: Secondary | ICD-10-CM | POA: Diagnosis not present

## 2015-01-17 DIAGNOSIS — J45909 Unspecified asthma, uncomplicated: Secondary | ICD-10-CM | POA: Diagnosis not present

## 2015-01-17 HISTORY — PX: COLONOSCOPY: SHX174

## 2015-01-17 MED ORDER — SODIUM CHLORIDE 0.9 % IV SOLN: 500.0000 mL | INTRAVENOUS | Status: DC

## 2015-01-17 NOTE — Progress Notes (Signed)
Report to PACU, RN, vss, BBS= Clear.  

## 2015-01-17 NOTE — Op Note (Signed)
[facility]  [employer] [place], [id number]   COLONOSCOPY PROCEDURE REPORT  PATIENT: Douglas Russell, Douglas Russell  MR#: [id number] BIRTHDATE: [birth year] , 64  yrs. old GENDER: male ENDOSCOPIST: Gatha Mayer, MD, [facility] PROCEDURE DATE:  01/17/2015 PROCEDURE:   Colonoscopy with biopsy First Screening Colonoscopy - Avg.  risk and is 50 yrs.  old or older - No.  Prior Negative Screening - Now for repeat screening. 10 or more years since last screening  History of Adenoma - Now for follow-up colonoscopy & has been > or = to 3 yrs.  N/A  Polyps Removed Today? Yes. ASA CLASS:   Class II INDICATIONS:average risk for colorectal cancer. MEDICATIONS: Propofol 240 mg IV and Monitored anesthesia care  DESCRIPTION OF PROCEDURE:   After the risks benefits and alternatives of the procedure were thoroughly explained, informed consent was obtained.  The digital rectal exam revealed no abnormalities of the rectum, revealed no prostatic nodules, and revealed the prostate was not enlarged.   The LB [id number]-[id number] endoscope was introduced through the anus and advanced to the cecum, which was identified by both the appendix and ileocecal valve. No adverse events experienced.   The quality of the prep was excellent, using MiraLax  The instrument was then slowly withdrawn as the colon was fully examined.      COLON FINDINGS: 1) 2 mm ascending colon polyp removed with cold forceps and sent to pathology. 2) Otherwise normal colonoscopy to cecum.  Retroflexed views revealed no abnormalities. The time to cecum=2 minutes 12 seconds.  Withdrawal time=11 minutes 04 seconds. The scope was withdrawn and the procedure completed. COMPLICATIONS: There were no immediate complications.  ENDOSCOPIC IMPRESSION: 1) 2 mm ascending colon polyp removed with cold forceps and sent to pathology. 2) Otherwise normal colonoscopy to cecum w/ exellent prep second screening  RECOMMENDATIONS: Timing of repeat  colonoscopy will be determined by pathology findings. 5-10 years likely  eSigned:  Gatha Mayer, MD, [facility] 01/17/2015 9:45 AM   cc: The Patient and Ruben Reason, MD

## 2015-01-17 NOTE — Patient Instructions (Addendum)
I found and removed one tiny polyp that looks benign. It may mean you need a colonoscopy before 10 years from now.  I will let you know pathology results and when to have another routine colonoscopy by mail.  I appreciate the opportunity to care for you. Gatha Mayer, MD, FACG  YOU HAD AN ENDOSCOPIC PROCEDURE TODAY AT [facility]: Refer to the procedure report that was given to you for any specific questions about what was found during the examination.  If the procedure report does not answer your questions, please call your gastroenterologist to clarify.  If you requested that your care partner not be given the details of your procedure findings, then the procedure report has been included in a sealed envelope for you to review at your convenience later.  YOU SHOULD EXPECT: Some feelings of bloating in the abdomen. Passage of more gas than usual.  Walking can help get rid of the air that was put into your GI tract during the procedure and reduce the bloating. If you had a lower endoscopy (such as a colonoscopy or flexible sigmoidoscopy) you may notice spotting of blood in your stool or on the toilet paper. If you underwent a bowel prep for your procedure, then you may not have a normal bowel movement for a few days.  DIET: Your first meal following the procedure should be a light meal and then it is ok to progress to your normal diet.  A half-sandwich or bowl of soup is an example of a good first meal.  Heavy or fried foods are harder to digest and may make you feel nauseous or bloated.  Likewise meals heavy in dairy and vegetables can cause extra gas to form and this can also increase the bloating.  Drink plenty of fluids but you should avoid alcoholic beverages for 24 hours.  ACTIVITY: Your care partner should take you home directly after the procedure.  You should plan to take it easy, moving slowly for the rest of the day.  You can resume normal activity the day after  the procedure however you should NOT DRIVE or use heavy machinery for 24 hours (because of the sedation medicines used during the test).    SYMPTOMS TO REPORT IMMEDIATELY: A gastroenterologist can be reached at any hour.  During normal business hours, 8:30 AM to 5:00 PM Monday through Friday, call [phone].  After hours and on weekends, please call the GI answering service at [phone] who will take a message and have the physician on call contact you.   Following lower endoscopy (colonoscopy or flexible sigmoidoscopy):  Excessive amounts of blood in the stool  Significant tenderness or worsening of abdominal pains  Swelling of the abdomen that is new, acute  Fever of 100F or higher  FOLLOW UP: If any biopsies were taken you will be contacted by phone or by letter within the next 1-3 weeks.  Call your gastroenterologist if you have not heard about the biopsies in 3 weeks.  Our staff will call the home number listed on your records the next business day following your procedure to check on you and address any questions or concerns that you may have at that time regarding the information given to you following your procedure. This is a courtesy call and so if there is no answer at the home number and we have not heard from you through the emergency physician on call, we will assume that you have returned to your  regular daily activities without incident.  SIGNATURES/CONFIDENTIALITY: You and/or your care partner have signed paperwork which will be entered into your electronic medical record.  These signatures attest to the fact that that the information above on your After Visit Summary has been reviewed and is understood.  Full responsibility of the confidentiality of this discharge information lies with you and/or your care-partner.  Recommendations Discharge instructions given to patient/care partner and polyp handout provided.

## 2015-01-17 NOTE — Progress Notes (Signed)
Called to room to assist during endoscopic procedure.  Patient ID and intended procedure confirmed with present staff. Received instructions for my participation in the procedure from the performing physician.  

## 2015-01-18 ENCOUNTER — Telehealth: Payer: Self-pay | Admitting: *Deleted

## 2015-01-18 NOTE — Telephone Encounter (Signed)
  Follow up Call-  Call back number 01/17/2015  Post procedure Call Back phone  # [phone]  Permission to leave phone message Yes     Patient questions:  Do you have a fever, pain , or abdominal swelling? No. Pain Score  0 *  Have you tolerated food without any problems? Yes.    Have you been able to return to your normal activities? Yes.    Do you have any questions about your discharge instructions: Diet   No. Medications  No. Follow up visit  No.  Do you have questions or concerns about your Care? No.  Actions: * If pain score is 4 or above: No action needed, pain <4.

## 2015-01-22 ENCOUNTER — Encounter: Payer: Self-pay | Admitting: Family Medicine

## 2015-01-25 ENCOUNTER — Encounter: Payer: Self-pay | Admitting: Internal Medicine

## 2015-01-25 DIAGNOSIS — Z86010 Personal history of colonic polyps: Secondary | ICD-10-CM

## 2015-01-25 DIAGNOSIS — Z860101 Personal history of adenomatous and serrated colon polyps: Secondary | ICD-10-CM

## 2015-01-25 HISTORY — DX: Personal history of adenomatous and serrated colon polyps: Z86.0101

## 2015-01-25 HISTORY — DX: Personal history of colonic polyps: Z86.010

## 2015-01-25 NOTE — Progress Notes (Signed)
Quick Note:  2 mm adenoma Repeat colonoscopy 7 years 2023 ______

## 2015-02-27 HISTORY — PX: EYE SURGERY: SHX253

## 2015-03-06 DIAGNOSIS — H4011X2 Primary open-angle glaucoma, moderate stage: Secondary | ICD-10-CM | POA: Diagnosis not present

## 2015-03-16 DIAGNOSIS — H4011X2 Primary open-angle glaucoma, moderate stage: Secondary | ICD-10-CM | POA: Diagnosis not present

## 2015-04-13 DIAGNOSIS — H4011X2 Primary open-angle glaucoma, moderate stage: Secondary | ICD-10-CM | POA: Diagnosis not present

## 2015-05-11 ENCOUNTER — Other Ambulatory Visit: Payer: Self-pay | Admitting: Family Medicine

## 2015-05-11 ENCOUNTER — Ambulatory Visit (INDEPENDENT_AMBULATORY_CARE_PROVIDER_SITE_OTHER): Payer: Medicare Other | Admitting: Family Medicine

## 2015-05-11 ENCOUNTER — Encounter: Payer: Self-pay | Admitting: Family Medicine

## 2015-05-11 VITALS — BP 110/84 | HR 66 | Temp 98.2°F | Resp 16 | Ht 71.5 in | Wt 184.4 lb

## 2015-05-11 DIAGNOSIS — Z1159 Encounter for screening for other viral diseases: Secondary | ICD-10-CM

## 2015-05-11 DIAGNOSIS — Z136 Encounter for screening for cardiovascular disorders: Secondary | ICD-10-CM | POA: Diagnosis not present

## 2015-05-11 DIAGNOSIS — Z0000 Encounter for general adult medical examination without abnormal findings: Secondary | ICD-10-CM | POA: Diagnosis not present

## 2015-05-11 DIAGNOSIS — H6123 Impacted cerumen, bilateral: Secondary | ICD-10-CM | POA: Diagnosis not present

## 2015-05-11 DIAGNOSIS — N401 Enlarged prostate with lower urinary tract symptoms: Secondary | ICD-10-CM | POA: Diagnosis not present

## 2015-05-11 DIAGNOSIS — J4530 Mild persistent asthma, uncomplicated: Secondary | ICD-10-CM

## 2015-05-11 DIAGNOSIS — E785 Hyperlipidemia, unspecified: Secondary | ICD-10-CM | POA: Diagnosis not present

## 2015-05-11 DIAGNOSIS — R351 Nocturia: Secondary | ICD-10-CM | POA: Diagnosis not present

## 2015-05-11 DIAGNOSIS — Z13220 Encounter for screening for lipoid disorders: Secondary | ICD-10-CM

## 2015-05-11 DIAGNOSIS — Z125 Encounter for screening for malignant neoplasm of prostate: Secondary | ICD-10-CM | POA: Diagnosis not present

## 2015-05-11 DIAGNOSIS — G479 Sleep disorder, unspecified: Secondary | ICD-10-CM | POA: Diagnosis not present

## 2015-05-11 DIAGNOSIS — IMO0001 Reserved for inherently not codable concepts without codable children: Secondary | ICD-10-CM

## 2015-05-11 LAB — POCT URINALYSIS DIPSTICK
BILIRUBIN UA: NEGATIVE
Glucose, UA: NEGATIVE
Ketones, UA: NEGATIVE
Leukocytes, UA: NEGATIVE
Nitrite, UA: NEGATIVE
Protein, UA: NEGATIVE
RBC UA: NEGATIVE
Spec Grav, UA: 1.015
UROBILINOGEN UA: 0.2
pH, UA: 5.5

## 2015-05-11 MED ORDER — NEOMYCIN-POLYMYXIN-HC 3.5-10000-1 OT SOLN: 3.0000 [drp] | Freq: Three times a day (TID) | OTIC | Status: DC

## 2015-05-11 MED ORDER — ZOLPIDEM TARTRATE 5 MG PO TABS: 2.5000 mg | ORAL_TABLET | Freq: Every evening | ORAL | Status: DC | PRN

## 2015-05-11 MED ORDER — FLUTICASONE-SALMETEROL 250-50 MCG/DOSE IN AEPB: 1.0000 | INHALATION_SPRAY | Freq: Two times a day (BID) | RESPIRATORY_TRACT | Status: DC

## 2015-05-11 NOTE — Patient Instructions (Addendum)
Keeping you healthy  Get these tests  Blood pressure- Have your blood pressure checked once a year by your healthcare provider.  Normal blood pressure is 120/80  Weight- Have your body mass index (BMI) calculated to screen for obesity.  BMI is a measure of body fat based on height and weight. You can also calculate your own BMI at ViewBanking.si.  Cholesterol- Have your cholesterol checked every year.  Diabetes- Have your blood sugar checked regularly if you have high blood pressure, high cholesterol, have a family history of diabetes or if you are overweight.  Screening for Colon Cancer- Colonoscopy starting at age 62.  Screening may begin sooner depending on your family history and other health conditions. Follow up colonoscopy as directed by your Gastroenterologist.  Screening for Prostate Cancer- Both blood work (PSA) and a rectal exam help screen for Prostate Cancer.  Screening begins at age 82 with African-American men and at age 64 with Caucasian men.  Screening may begin sooner depending on your family history.  Take these medicines  Aspirin- One aspirin daily can help prevent Heart disease and Stroke.  Flu shot- Every fall.  Tetanus- Every 10 years.  Zostavax- Once after the age of 34 to prevent Shingles.  Pneumonia shot- Once after the age of 57; if you are younger than 22, ask your healthcare provider if you need a Pneumonia shot.  Take these steps  Don't smoke- If you do smoke, talk to your doctor about quitting.  For tips on how to quit, go to www.smokefree.gov or call 1-800-QUIT-NOW.  Be physically active- Exercise 5 days a week for at least 30 minutes.  If you are not already physically active start slow and gradually work up to 30 minutes of moderate physical activity.  Examples of moderate activity include walking briskly, mowing the yard, dancing, swimming, bicycling, etc.  Eat a healthy diet- Eat a variety of healthy food such as fruits, vegetables, low  fat milk, low fat cheese, yogurt, lean meant, poultry, fish, beans, tofu, etc. For more information go to www.thenutritionsource.org  Drink alcohol in moderation- Limit alcohol intake to less than two drinks a day. Never drink and drive.  Dentist- Brush and floss twice daily; visit your dentist twice a year.  Depression- Your emotional health is as important as your physical health. If you're feeling down, or losing interest in things you would normally enjoy please talk to your healthcare provider.  Eye exam- Visit your eye doctor every year.  Safe sex- If you may be exposed to a sexually transmitted infection, use a condom.  Seat belts- Seat belts can save your life; always wear one.  Smoke/Carbon Monoxide detectors- These detectors need to be installed on the appropriate level of your home.  Replace batteries at least once a year.  Skin cancer- When out in the sun, cover up and use sunscreen 15 SPF or higher.  Violence- If anyone is threatening you, please tell your healthcare provider.  Living Will/ Health care power of attorney- Speak with your healthcare provider and family.   You have ongoing concerns about sleep disorder; if you want to see Dr. Linna Darner next door, you will have to walk in at [address]. If you want to schedule an appointment with a provider, you can do so at the [address] building.

## 2015-05-12 ENCOUNTER — Other Ambulatory Visit: Payer: Self-pay

## 2015-05-12 LAB — COMPLETE METABOLIC PANEL WITH GFR
ALBUMIN: 4 g/dL (ref 3.5–5.2)
ALT: 23 U/L (ref 0–53)
AST: 24 U/L (ref 0–37)
Alkaline Phosphatase: 81 U/L (ref 39–117)
BUN: 17 mg/dL (ref 6–23)
CALCIUM: 9.2 mg/dL (ref 8.4–10.5)
CHLORIDE: 104 meq/L (ref 96–112)
CO2: 25 mEq/L (ref 19–32)
Creat: 0.84 mg/dL (ref 0.50–1.35)
GFR, Est African American: >89 mL/min
GFR, Est Non African American: >89 mL/min
Glucose, Bld: 85 mg/dL (ref 70–99)
Potassium: 4.6 mEq/L (ref 3.5–5.3)
Sodium: 138 mEq/L (ref 135–145)
Total Bilirubin: 0.8 mg/dL (ref 0.2–1.2)
Total Protein: 6.7 g/dL (ref 6.0–8.3)

## 2015-05-12 LAB — THYROID PANEL WITH TSH
Free Thyroxine Index: 1.6 (ref 1.4–3.8)
T3 UPTAKE: 27 % (ref 22–35)
T4, Total: 6 ug/dL (ref 4.5–12.0)
TSH: 0.898 u[IU]/mL (ref 0.350–4.500)

## 2015-05-12 LAB — PSA, MEDICARE: PSA: 0.45 ng/mL (ref <=4.00)

## 2015-05-12 LAB — HEPATITIS C ANTIBODY: HCV AB: NEGATIVE

## 2015-05-12 LAB — LDL CHOLESTEROL, DIRECT: Direct LDL: 113 mg/dL — ABNORMAL HIGH

## 2015-05-12 MED ORDER — NEOMYCIN-POLYMYXIN-HC 3.5-10000-1 OT SOLN: 3.0000 [drp] | Freq: Three times a day (TID) | OTIC | Status: DC

## 2015-05-12 NOTE — Telephone Encounter (Signed)
Cortisporin otic solution refilled as requested.

## 2015-05-15 ENCOUNTER — Encounter: Payer: Self-pay | Admitting: Family Medicine

## 2015-05-15 NOTE — Progress Notes (Signed)
Subjective:    Patient ID: Douglas Russell, male    DOB: 10-03-1951, 64 y.o.   MRN: [id number]  HPI  This 64 y.o male is here for [facility] Subsequent Annual Wellness visit; previous healthcare has been w/ Dr. Linna Darner.  Chronic medical issues include chronic insomnia treated w/  Zolpidem 5 mg hs for years in addition to other herbal supplements. He has stable uncomplicated asthma and uses Advair Diskus prn. Moderate DJD is stable; pt is S/P L Total Hip Arthroplasty performed by Dr. Mardelle Matte in Oct 2015.  HCM: CRS- Current (Jan 2016 w/ 5-7 year recall)           IMM- Current.           Vision- Current.           Dental-  Patient Active Problem List   Diagnosis Date Noted  . Hx of adenomatous polyp of colon 01/25/2015  . Status post left hip replacement 10/13/2014  . Asthma, chronic 10/13/2014  . Osteoarthritis of left hip 10/10/2014  . Hip arthritis 10/10/2014  . History of asthma 08/08/2014  . History of umbilical hernia 13/24/4010  . History of pelvic fracture 08/08/2014    Prior to Admission medications   Medication Sig Start Date End Date Taking? Authorizing Provider  aspirin EC 81 MG tablet Take 81 mg by mouth daily.   Yes Historical Provider, MD  CINNAMON PO Take 1 capsule by mouth daily.   Yes Historical Provider, MD  Cranberry 500 MG CAPS Take 500 mg by mouth daily.   Yes Historical Provider, MD  fish oil-omega-3 fatty acids 1000 MG capsule Take 1 g by mouth daily.   Yes Historical Provider, MD  Fluticasone-Salmeterol (ADVAIR DISKUS) 250-50 MCG/DOSE AEPB Inhale 1 puff into the lungs 2 (two) times daily.   Yes Barton Fanny, MD  Glucosamine HCl 1000 MG TABS Take 1,000 mg by mouth 2 (two) times daily.   Yes Historical Provider, MD  L-ARGININE PO Take 2 tablets by mouth 2 (two) times daily.   Yes Historical Provider, MD  Melatonin 10 MG TABS Take 10 mg by mouth at bedtime.   Yes Historical Provider, MD  Multiple Vitamin (MULITIVITAMIN WITH MINERALS) TABS Take 1 tablet by mouth  daily.   Yes Historical Provider, MD  Saw Palmetto, Serenoa repens, 1000 MG CAPS Take 1 capsule by mouth.   Yes Historical Provider, MD  Travoprost, BAK Free, (TRAVATAN) 0.004 % SOLN ophthalmic solution Place 1 drop into both eyes at bedtime.    Yes Historical Provider, MD  Turmeric Curcumin 500 MG CAPS Take 500 mg by mouth daily.   Yes Historical Provider, MD  neomycin-polymyxin-hydrocortisone (CORTISPORIN) otic solution Place 3 drops into the right ear 3 (three) times daily.    Barton Fanny, MD  valACYclovir (VALTREX) 500 MG tablet Take 1 tablet (500 mg total) by mouth 2 (two) times daily. For 3 days per each outbreak Patient not taking: Reported on 01/05/2015 02/24/14   Posey Boyer, MD  Valerian Root 450 MG CAPS Take 1,350 mg by mouth at bedtime.    Historical Provider, MD  zolpidem (AMBIEN) 5 MG tablet Take 0.5-1 tablets (2.5-5 mg total) by mouth at bedtime as needed for sleep.    Barton Fanny, MD    Past Surgical History  Procedure Laterality Date  . Pelvic fracture surgery  1995  . Fracture surgery Right 1995    hip  . Hernia repair  2725    umbilical   .  Back cyst  2006    surgery x4 for cyst on the back   . Total hip arthroplasty Left 10/10/2014    dr Mardelle Matte  . Total hip arthroplasty Left 10/10/2014    Procedure: TOTAL HIP ARTHROPLASTY;  Surgeon: Johnny Bridge, MD;  Location: [place];  Service: Orthopedics;  Laterality: Left;  Marland Kitchen Eye surgery Bilateral March 2016    Dr. Marylynn Pearson, [place]    History   Social History  . Marital Status: Divorced    Spouse Name: N/A  . Number of Children: N/A  . Years of Education: N/A   Occupational History  . Not on file.   Social History Main Topics  . Smoking status: Never Smoker   . Smokeless tobacco: Never Used  . Alcohol Use: 0.0 oz/week    0 Standard drinks or equivalent per week     Comment: occasional glass of wine  . Drug Use: No  . Sexual Activity: Yes   Other Topics Concern  . Not on file   Social History  Narrative    Family History  Problem Relation Age of Onset  . Hypertension Mother   . Diabetes Father   . Hypertension Sister   . Cancer Maternal Grandmother   . Hypertension Sister   . Colon cancer Neg Hx     Review of Systems  Constitutional: Negative.   HENT: Positive for postnasal drip, rhinorrhea and sneezing.        Takes Zyrtec.  Eyes: Negative.   Respiratory: Positive for shortness of breath. Negative for cough, chest tightness and wheezing.   Cardiovascular: Negative.   Gastrointestinal: Negative.   Endocrine: Positive for polyuria. Negative for polydipsia and polyphagia.  Genitourinary: Positive for frequency. Negative for dysuria, urgency, hematuria, decreased urine volume, discharge, difficulty urinating and testicular pain.  Musculoskeletal: Positive for back pain and arthralgias. Negative for myalgias and joint swelling.       Arthritis as result of fractured pelvic (work related accident in 1995).  Allergic/Immunologic: Positive for environmental allergies and food allergies.  Neurological: Negative.   Hematological: Negative.   Psychiatric/Behavioral: Positive for sleep disturbance.       S/P 2 sleep studies >> "non-restorative light stage sleep disorder".       Objective:   Physical Exam  Constitutional: He is oriented to person, place, and time. Vital signs are normal. He appears well-developed and well-nourished. No distress.  Blood pressure 110/84, pulse 66, temperature 98.2 F (36.8 C), temperature source Oral, resp. rate 16, height 5' 11.5" (1.816 m), weight 184 lb 6.4 oz (83.643 kg), SpO2 98 %.   HENT:  Head: Normocephalic and atraumatic.  Right Ear: Hearing and external ear normal.  Left Ear: Hearing and external ear normal.  Nose: Mucosal edema present. No nasal deformity or septal deviation.  Mouth/Throat: Uvula is midline and mucous membranes are normal. No oral lesions. Normal dentition. Posterior oropharyngeal erythema present. No oropharyngeal  exudate or posterior oropharyngeal edema.  Bilateral cerumen impaction.  Eyes: Conjunctivae and EOM are normal. Pupils are equal, round, and reactive to light. Lids are everted and swept, no foreign bodies found. No scleral icterus.  Neck: Normal range of motion, full passive range of motion without pain and phonation normal. Neck supple. No JVD present. No spinous process tenderness and no muscular tenderness present. Carotid bruit is not present. No thyroid mass and no thyromegaly present.  Cardiovascular: Normal rate, regular rhythm, S1 normal, S2 normal, normal heart sounds and normal pulses.   No extrasystoles are present.  PMI is not displaced.  Exam reveals no gallop.   No murmur heard. Pulmonary/Chest: Effort normal and breath sounds normal. No respiratory distress. He has no decreased breath sounds. He has no wheezes. He has no rhonchi.  Abdominal: Soft. Normal appearance and bowel sounds are normal. He exhibits no distension, no abdominal bruit, no pulsatile midline mass and no mass. There is no hepatosplenomegaly. There is no tenderness. There is no guarding and no CVA tenderness.  Genitourinary: Rectum normal. Rectal exam shows no external hemorrhoid, no fissure, no mass, no tenderness and anal tone normal. Guaiac negative stool. Prostate is enlarged. Prostate is not tender.  Musculoskeletal:       Cervical back: Normal.       Thoracic back: Normal.       Lumbar back: He exhibits tenderness and spasm. He exhibits no bony tenderness, no deformity and no pain.  Mild degenerative changes in major joints; good ROM w/o erythema, effusions, deformities or abnl alignment.  Lymphadenopathy:       Head (right side): No submental, no submandibular, no tonsillar, no preauricular, no posterior auricular and no occipital adenopathy present.       Head (left side): No submental, no submandibular, no tonsillar, no preauricular, no posterior auricular and no occipital adenopathy present.    He has no  cervical adenopathy.    He has no axillary adenopathy.       Right: No inguinal and no supraclavicular adenopathy present.       Left: No inguinal and no supraclavicular adenopathy present.  Neurological: He is alert and oriented to person, place, and time. He has normal strength and normal reflexes. He displays no atrophy and no tremor. No cranial nerve deficit. He exhibits normal muscle tone. He displays a negative Romberg sign. Coordination and gait normal.  Skin: Skin is warm, dry and intact. No ecchymosis, no lesion and no rash noted. He is not diaphoretic. No cyanosis or erythema. Nails show no clubbing.  Psychiatric: He has a normal mood and affect. His speech is normal and behavior is normal. Judgment and thought content normal. Cognition and memory are normal.  Nursing note and vitals reviewed.   Both ear canals irrigated w/ successful dis-impaction; R canal has mild erythema but TMs are normal.   Results for orders placed or performed in visit on 05/11/15  POCT urinalysis dipstick  Result Value Ref Range   Color, UA Amber    Clarity, UA clear    Glucose, UA Negative    Bilirubin, UA Negative    Ketones, UA Negative    Spec Grav, UA 1.015    Blood, UA negative    pH, UA 5.5    Protein, UA negative    Urobilinogen, UA 0.2    Nitrite, UA Negative    Leukocytes, UA Negative       Assessment & Plan:  Medicare annual wellness visit, subsequent - Plan: COMPLETE METABOLIC PANEL WITH GFR, POCT urinalysis dipstick, Thyroid Panel With TSH  Sleep disturbance - Plan: zolpidem (AMBIEN) 5 MG tablet; continue melatonin and relaxation techniques to improve sleep hygiene.  Bilateral impacted cerumen- Resolved after irrigation. RX: Cortisporin Otic Gtts for use in R ear.  Asthma, chronic, mild persistent, uncomplicated- RX: Advair Diskus.  BPH associated with nocturia  Need for hepatitis C screening test - Plan: Hepatitis C antibody  Screening for prostate cancer - Plan: PSA,  Medicare  Encounter for cholesteral screening for cardiovascular disease - Plan: LDL Cholesterol, Direct   Meds ordered this encounter  Medications  . Fluticasone-Salmeterol (ADVAIR DISKUS) 250-50 MCG/DOSE AEPB    Sig: Inhale 1 puff into the lungs 2 (two) times daily.    Dispense:  1 each    Refill:  5  . zolpidem (AMBIEN) 5 MG tablet    Sig: Take 0.5-1 tablets (2.5-5 mg total) by mouth at bedtime as needed for sleep.    Dispense:  30 tablet    Refill:  2  . neomycin-polymyxin-hydrocortisone (CORTISPORIN) otic solution    Sig: Place 3 drops into the right ear 3 (three) times daily.    Dispense:  10 mL    Refill:  0

## 2015-06-25 ENCOUNTER — Other Ambulatory Visit: Payer: Self-pay

## 2015-07-06 ENCOUNTER — Ambulatory Visit (INDEPENDENT_AMBULATORY_CARE_PROVIDER_SITE_OTHER): Payer: Medicare Other | Admitting: Family Medicine

## 2015-07-06 VITALS — BP 118/74 | HR 72 | Temp 98.3°F | Resp 18 | Ht 72.25 in | Wt 182.4 lb

## 2015-07-06 DIAGNOSIS — R109 Unspecified abdominal pain: Secondary | ICD-10-CM | POA: Diagnosis not present

## 2015-07-06 DIAGNOSIS — S46912A Strain of unspecified muscle, fascia and tendon at shoulder and upper arm level, left arm, initial encounter: Secondary | ICD-10-CM

## 2015-07-06 DIAGNOSIS — S39012A Strain of muscle, fascia and tendon of lower back, initial encounter: Secondary | ICD-10-CM

## 2015-07-06 LAB — POCT UA - MICROSCOPIC ONLY
Bacteria, U Microscopic: NEGATIVE
CASTS, UR, LPF, POC: NEGATIVE
CRYSTALS, UR, HPF, POC: NEGATIVE
Epithelial cells, urine per micros: NEGATIVE
Mucus, UA: NEGATIVE
RBC, urine, microscopic: NEGATIVE
Yeast, UA: NEGATIVE

## 2015-07-06 LAB — POCT URINALYSIS DIPSTICK
Bilirubin, UA: NEGATIVE
Blood, UA: NEGATIVE
GLUCOSE UA: NEGATIVE
Ketones, UA: NEGATIVE
LEUKOCYTES UA: NEGATIVE
Nitrite, UA: NEGATIVE
Protein, UA: NEGATIVE
Spec Grav, UA: 1.02
UROBILINOGEN UA: 0.2
pH, UA: 7

## 2015-07-06 MED ORDER — METHOCARBAMOL 500 MG PO TABS: ORAL_TABLET | ORAL | Status: DC

## 2015-07-06 NOTE — Progress Notes (Signed)
  Subjective:  Patient ID: Douglas Russell, male    DOB: 13-Nov-1951  Age: 64 y.o. MRN: [id number]  Patient is here complaining of left-sided flank pain back where his kidney use. 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 He didn't do any particularly hard straining, though he has to move some things back and forth. After work he noticed he was hurting in his left kidney area. It's draining down into the hip area. He was able to work some after that the next day. However since then it has gotten worse. Tightened up pretty bad. He is scheduled to work tonight again. There was no specific point time of injury. He does have a history of old pelvic fracture. He also had pain develop about the same time in his left arm medially around the region of the elbow and below. He noticed it to be swollen. That has gone back down. No fever.  Past, family, social history reviewed  Objective:   Good range of motion of his arm. No gross swelling. No muscle tenderness could be palpated. Strength is good.  He has no gross anomaly of his back on inspection. It is symmetrical. He can do good flexion and extension. Gait is normal. No major tenderness to palpation at this moment. Deep tendon reflexes 1+ right knee, absent left. Straight leg raising negative. He points to the left flank area as the primary source of pain.  Results for orders placed or performed in visit on 07/06/15  POCT urinalysis dipstick  Result Value Ref Range   Color, UA yellow    Clarity, UA clear    Glucose, UA neg    Bilirubin, UA neg    Ketones, UA neg    Spec Grav, UA 1.020    Blood, UA neg    pH, UA 7.0    Protein, UA neg    Urobilinogen, UA 0.2    Nitrite, UA neg    Leukocytes, UA Negative Negative  POCT UA - Microscopic Only  Result Value Ref Range   WBC, Ur, HPF, POC 0-1    RBC, urine, microscopic neg    Bacteria, U Microscopic neg    Mucus, UA neg    Epithelial cells, urine per micros neg    Crystals, Ur, HPF, POC neg    Casts, Ur, LPF, POC neg    Yeast, UA neg      Assessment & Plan:   Assessment: Probable strain causing back pain. Left arm muscular pain and swelling, subsided  Plan: Reassured the patient that there is no evidence of urinary infection. Patient feels like he can work without further straining his back. Cautioned him to be cautious. If worse please return. No major trauma requiring x-ray studies at this time. We'll check a urinalysis to make certain that is not involved. Patient Instructions  Take methocarbamol 500 mg maximum of 1 in the morning, one in the afternoon, and 2 at bedtime for muscle relaxant  Take Tylenol 1000 mg maximum of 3 times daily if needed for pain  Apply heat or ice or alternate the 2 if needed for pain  I would expect this to be improving over the next few days, but if worse he should return.       HOPPER,DAVID, MD 07/06/2015

## 2015-07-06 NOTE — Patient Instructions (Signed)
Take methocarbamol 500 mg maximum of 1 in the morning, one in the afternoon, and 2 at bedtime for muscle relaxant  Take Tylenol 1000 mg maximum of 3 times daily if needed for pain  Apply heat or ice or alternate the 2 if needed for pain  I would expect this to be improving over the next few days, but if worse he should return.

## 2015-07-17 DIAGNOSIS — H4011X2 Primary open-angle glaucoma, moderate stage: Secondary | ICD-10-CM | POA: Diagnosis not present

## 2015-09-20 ENCOUNTER — Ambulatory Visit (INDEPENDENT_AMBULATORY_CARE_PROVIDER_SITE_OTHER): Payer: Medicare Other | Admitting: Physician Assistant

## 2015-09-20 VITALS — BP 110/74 | HR 67 | Temp 98.3°F | Resp 18 | Ht 72.0 in | Wt 179.4 lb

## 2015-09-20 DIAGNOSIS — Z0000 Encounter for general adult medical examination without abnormal findings: Secondary | ICD-10-CM | POA: Diagnosis not present

## 2015-09-20 DIAGNOSIS — G479 Sleep disorder, unspecified: Secondary | ICD-10-CM

## 2015-09-20 DIAGNOSIS — Z23 Encounter for immunization: Secondary | ICD-10-CM | POA: Diagnosis not present

## 2015-09-20 NOTE — Patient Instructions (Signed)
Please contact us and let me know if you would like a sleep study.   Influenza Virus Vaccine injection (Fluarix) What is this medicine? INFLUENZA VIRUS VACCINE (in floo EN zuh VAHY ruhs vak SEEN) helps to reduce the risk of getting influenza also known as the flu. This medicine may be used for other purposes; ask your health care provider or pharmacist if you have questions. COMMON BRAND NAME(S): Fluarix, Fluzone What should I tell my health care provider before I take this medicine? They need to know if you have any of these conditions: -bleeding disorder like hemophilia -fever or infection -Guillain-Barre syndrome or other neurological problems -immune system problems -infection with the human immunodeficiency virus (HIV) or AIDS -low blood platelet counts -multiple sclerosis -an unusual or allergic reaction to influenza virus vaccine, eggs, chicken proteins, latex, gentamicin, other medicines, foods, dyes or preservatives -pregnant or trying to get pregnant -breast-feeding How should I use this medicine? This vaccine is for injection into a muscle. It is given by a health care professional. A copy of Vaccine Information Statements will be given before each vaccination. Read this sheet carefully each time. The sheet may change frequently. Talk to your pediatrician regarding the use of this medicine in children. Special care may be needed. Overdosage: If you think you have taken too much of this medicine contact a poison control center or emergency room at once. NOTE: This medicine is only for you. Do not share this medicine with others. What if I miss a dose? This does not apply. What may interact with this medicine? -chemotherapy or radiation therapy -medicines that lower your immune system like etanercept, anakinra, infliximab, and adalimumab -medicines that treat or prevent blood clots like warfarin -phenytoin -steroid medicines like prednisone or  cortisone -theophylline -vaccines This list may not describe all possible interactions. Give your health care provider a list of all the medicines, herbs, non-prescription drugs, or dietary supplements you use. Also tell them if you smoke, drink alcohol, or use illegal drugs. Some items may interact with your medicine. What should I watch for while using this medicine? Report any side effects that do not go away within 3 days to your doctor or health care professional. Call your health care provider if any unusual symptoms occur within 6 weeks of receiving this vaccine. You may still catch the flu, but the illness is not usually as bad. You cannot get the flu from the vaccine. The vaccine will not protect against colds or other illnesses that may cause fever. The vaccine is needed every year. What side effects may I notice from receiving this medicine? Side effects that you should report to your doctor or health care professional as soon as possible: -allergic reactions like skin rash, itching or hives, swelling of the face, lips, or tongue Side effects that usually do not require medical attention (report to your doctor or health care professional if they continue or are bothersome): -fever -headache -muscle aches and pains -pain, tenderness, redness, or swelling at site where injected -weak or tired This list may not describe all possible side effects. Call your doctor for medical advice about side effects. You may report side effects to FDA at 1-800-FDA-1088. Where should I keep my medicine? This vaccine is only given in a clinic, pharmacy, doctor's office, or other health care setting and will not be stored at home. NOTE: This sheet is a summary. It may not cover all possible information. If you have questions about this medicine, talk to your doctor,  pharmacist, or health care provider.  2015, Elsevier/Gold Standard. (2008-07-12 09:30:40) Pneumococcal Vaccine, Polyvalent suspension for  injection What is this medicine? PNEUMOCOCCAL VACCINE, POLYVALENT (NEU mo KOK al vak SEEN, pol ee VEY luhnt) is a vaccine to prevent pneumococcus bacteria infection. These bacteria are a major cause of ear infections, 'Strep throat' infections, and serious pneumonia, meningitis, or blood infections worldwide. These vaccines help the body to produce antibodies (protective substances) that help your body defend against these bacteria. This vaccine is recommended for infants and young children. This vaccine will not treat an infection. This medicine may be used for other purposes; ask your health care provider or pharmacist if you have questions. COMMON BRAND NAME(S): Prevnar 13 What should I tell my health care provider before I take this medicine? They need to know if you have any of these conditions: -bleeding problems -fever -immune system problems -low platelet count in the blood -seizures -an unusual or allergic reaction to pneumococcal vaccine, diphtheria toxoid, other vaccines, latex, other medicines, foods, dyes, or preservatives -pregnant or trying to get pregnant -breast-feeding How should I use this medicine? This vaccine is for injection into a muscle. It is given by a health care professional. A copy of Vaccine Information Statements will be given before each vaccination. Read this sheet carefully each time. The sheet may change frequently. Talk to your pediatrician regarding the use of this medicine in children. While this drug may be prescribed for children as young as 41 weeks old for selected conditions, precautions do apply. Overdosage: If you think you have taken too much of this medicine contact a poison control center or emergency room at once. NOTE: This medicine is only for you. Do not share this medicine with others. What if I miss a dose? It is important not to miss your dose. Call your doctor or health care professional if you are unable to keep an appointment. What may  interact with this medicine? -medicines for cancer chemotherapy -medicines that suppress your immune function -medicines that treat or prevent blood clots like warfarin, enoxaparin, and dalteparin -steroid medicines like prednisone or cortisone This list may not describe all possible interactions. Give your health care provider a list of all the medicines, herbs, non-prescription drugs, or dietary supplements you use. Also tell them if you smoke, drink alcohol, or use illegal drugs. Some items may interact with your medicine. What should I watch for while using this medicine? Mild fever and pain should go away in 3 days or less. Report any unusual symptoms to your doctor or health care professional. What side effects may I notice from receiving this medicine? Side effects that you should report to your doctor or health care professional as soon as possible: -allergic reactions like skin rash, itching or hives, swelling of the face, lips, or tongue -breathing problems -confused -fever over 102 degrees F -pain, tingling, numbness in the hands or feet -seizures -unusual bleeding or bruising -unusual muscle weakness Side effects that usually do not require medical attention (report to your doctor or health care professional if they continue or are bothersome): -aches and pains -diarrhea -fever of 102 degrees F or less -headache -irritable -loss of appetite -pain, tender at site where injected -trouble sleeping This list may not describe all possible side effects. Call your doctor for medical advice about side effects. You may report side effects to FDA at 1-800-FDA-1088. Where should I keep my medicine? This does not apply. This vaccine is given in a clinic, pharmacy, doctor's office, or  other health care setting and will not be stored at home. NOTE: This sheet is a summary. It may not cover all possible information. If you have questions about this medicine, talk to your doctor,  pharmacist, or health care provider.  2015, Elsevier/Gold Standard. (2009-02-27 10:17:22)

## 2015-09-22 NOTE — Progress Notes (Signed)
[facility] [address], [place] [address] [phone]  Date:  09/20/2015   Name:  Douglas Russell   DOB:  Jul 16, 1951   MRN:  [id number]  PCP:  Ruben Reason, MD    History of Present Illness:  Douglas Russell is a 64 y.o. male patient who presents to [facility] for flu vaccine and pneumonia vaccine.  He has had the pneumoccal vaccine-23 2 years ago, and returns for overdue 13.  He denies any recent infections or allergy.  He has completed forms. Patient discusses lack of sleep.  He has had two sleep studies that note a light cycle sleep.  He has attempted multiple sleep aids, but have not been helpful over time.  He works until around 11pm- 1am.  He attempts to keep a regular sleep regimen and exercises at least 3 times per week.  He notes that he is not fatigued much, and his work includes a rather strenuous labor.  He takes vitamins as listed below.   Notes no chest pains, palpitations, sob, or leg swellings.  No illicit drug use or alcohol.       Patient Active Problem List   Diagnosis Date Noted  . Hx of adenomatous polyp of colon 01/25/2015  . Status post left hip replacement 10/13/2014  . Asthma, chronic 10/13/2014  . Osteoarthritis of left hip 10/10/2014  . Hip arthritis 10/10/2014  . History of asthma 08/08/2014  . History of umbilical hernia 06/21/7627  . History of pelvic fracture 08/08/2014    Past Medical History  Diagnosis Date  . Glaucoma   . Asthma     has had since he was a child, states he only uses the inhaler PRN  . Pneumonia     in hosp.,[facility] ( /w pelvic fx)-   . Arthritis     L&R hip & pelvis   . History of blood transfusion     /w pelvic fx.   . Glaucoma     both eyes  . Osteoarthritis of left hip 10/10/2014  . Cataract   . Hx of adenomatous polyp of colon 01/25/2015    Past Surgical History  Procedure Laterality Date  . Pelvic fracture surgery  1995  . Fracture surgery Right 1995    hip  . Hernia repair  3151   umbilical   . Back cyst  2006    surgery x4 for cyst on the back   . Total hip arthroplasty Left 10/10/2014    dr Mardelle Matte  . Total hip arthroplasty Left 10/10/2014    Procedure: TOTAL HIP ARTHROPLASTY;  Surgeon: Johnny Bridge, MD;  Location: [facility];  Service: Orthopedics;  Laterality: Left;  Marland Kitchen Eye surgery Bilateral March 2016    Dr. Marylynn Pearson, [place]    Social History  Substance Use Topics  . Smoking status: Never Smoker   . Smokeless tobacco: Never Used  . Alcohol Use: 0.0 oz/week    0 Standard drinks or equivalent per week     Comment: occasional glass of wine    Family History  Problem Relation Age of Onset  . Hypertension Mother   . Diabetes Father   . Hypertension Sister   . Cancer Maternal Grandmother   . Hypertension Sister   . Colon cancer Neg Hx     Allergies  Allergen Reactions  . Shellfish Allergy Anaphylaxis and Swelling    Throat swells closed," like there is a block of cement in my throat"   . Trazodone  And Nefazodone Anxiety    Medication list has been reviewed and updated.  Current Outpatient Prescriptions on File Prior to Visit  Medication Sig Dispense Refill  . aspirin EC 81 MG tablet Take 81 mg by mouth daily.    Marland Kitchen CINNAMON PO Take 1 capsule by mouth daily.    . Cranberry 500 MG CAPS Take 500 mg by mouth daily.    . fish oil-omega-3 fatty acids 1000 MG capsule Take 1 g by mouth daily.    . Fluticasone-Salmeterol (ADVAIR DISKUS) 250-50 MCG/DOSE AEPB Inhale 1 puff into the lungs 2 (two) times daily. 1 each 5  . Glucosamine HCl 1000 MG TABS Take 1,000 mg by mouth 2 (two) times daily.    . L-ARGININE PO Take 2 tablets by mouth 2 (two) times daily.    . Melatonin 10 MG TABS Take 10 mg by mouth at bedtime.    . methocarbamol (ROBAXIN) 500 MG tablet Take 1 in the morning, 1 in the afternoon, and 2 at bedtime as needed for muscle relaxant. 30 tablet 0  . Multiple Vitamin (MULITIVITAMIN WITH MINERALS) TABS Take 1 tablet by mouth daily.    . Saw Palmetto,  Serenoa repens, 1000 MG CAPS Take 1 capsule by mouth.    . Travoprost, BAK Free, (TRAVATAN) 0.004 % SOLN ophthalmic solution Place 1 drop into both eyes at bedtime.     . Turmeric Curcumin 500 MG CAPS Take 500 mg by mouth daily.    . valACYclovir (VALTREX) 500 MG tablet Take 1 tablet (500 mg total) by mouth 2 (two) times daily. For 3 days per each outbreak 30 tablet 0  . Valerian Root 450 MG CAPS Take 1,350 mg by mouth at bedtime.    Marland Kitchen zolpidem (AMBIEN) 5 MG tablet Take 0.5-1 tablets (2.5-5 mg total) by mouth at bedtime as needed for sleep. 30 tablet 2  . neomycin-polymyxin-hydrocortisone (CORTISPORIN) otic solution Place 3 drops into the right ear 3 (three) times daily. (Patient not taking: Reported on 09/20/2015) 10 mL 0   No current facility-administered medications on file prior to visit.    ROS ROS otherwise unremarkable unless listed below  Physical Examination: BP 110/74 mmHg  Pulse 67  Temp(Src) 98.3 F (36.8 C) (Oral)  Resp 18  Ht 6' (1.829 m)  Wt 179 lb 6.4 oz (81.375 kg)  BMI 24.33 kg/m2  SpO2 98% Ideal Body Weight: Weight in (lb) to have BMI = 25: 183.9  Physical Exam  Constitutional: He is oriented to person, place, and time. He appears well-developed and well-nourished. No distress.  HENT:  Head: Normocephalic and atraumatic.  Eyes: Conjunctivae and EOM are normal. Pupils are equal, round, and reactive to light.  Cardiovascular: Normal rate, regular rhythm and normal heart sounds.  Exam reveals no friction rub.   No murmur heard. Pulmonary/Chest: Effort normal. No respiratory distress.  Neurological: He is alert and oriented to person, place, and time.  Skin: Skin is warm and dry. He is not diaphoretic.  Psychiatric: He has a normal mood and affect. His behavior is normal.     Assessment and Plan: 64 year old male with PMH listed above is here today for flu and pneumococcal vaccine.  We will give the prevnar 13 at this time.   -He will contact if he wishes to  have another sleep study.  1. Need for prophylactic vaccination against Streptococcus pneumoniae (pneumococcus) - Pneumococcal conjugate vaccine 13-valent IM  2. Health care maintenance  3. Need for prophylactic vaccination and inoculation against  influenza - Flu Vaccine QUAD 36+ mos IM   Ivar Drape, PA-C [facility] 09/22/2015 3:24 PM

## 2015-11-05 ENCOUNTER — Encounter: Payer: Self-pay | Admitting: Family Medicine

## 2015-11-09 ENCOUNTER — Other Ambulatory Visit: Payer: Self-pay | Admitting: Family Medicine

## 2015-11-09 DIAGNOSIS — G479 Sleep disorder, unspecified: Secondary | ICD-10-CM

## 2015-11-09 MED ORDER — ZOLPIDEM TARTRATE 5 MG PO TABS: 2.5000 mg | ORAL_TABLET | Freq: Every evening | ORAL | Status: DC | PRN

## 2015-11-12 ENCOUNTER — Other Ambulatory Visit: Payer: Self-pay | Admitting: Family Medicine

## 2015-11-12 DIAGNOSIS — J309 Allergic rhinitis, unspecified: Secondary | ICD-10-CM

## 2015-11-12 DIAGNOSIS — G479 Sleep disorder, unspecified: Secondary | ICD-10-CM

## 2015-11-12 MED ORDER — FLUTICASONE-SALMETEROL 250-50 MCG/DOSE IN AEPB: 1.0000 | INHALATION_SPRAY | Freq: Two times a day (BID) | RESPIRATORY_TRACT | Status: DC

## 2015-11-27 DIAGNOSIS — H401132 Primary open-angle glaucoma, bilateral, moderate stage: Secondary | ICD-10-CM | POA: Diagnosis not present

## 2015-11-30 MED ORDER — ZOLPIDEM TARTRATE 5 MG PO TABS: 2.5000 mg | ORAL_TABLET | Freq: Every evening | ORAL | Status: DC | PRN

## 2015-11-30 NOTE — Addendum Note (Signed)
Addended by: HOPPER, DAVID H on: 11/30/2015 10:00 AM   Modules accepted: Orders

## 2015-11-30 NOTE — Telephone Encounter (Signed)
Needs seen

## 2015-12-25 DIAGNOSIS — M1612 Unilateral primary osteoarthritis, left hip: Secondary | ICD-10-CM | POA: Diagnosis not present

## 2016-01-30 ENCOUNTER — Ambulatory Visit (INDEPENDENT_AMBULATORY_CARE_PROVIDER_SITE_OTHER): Payer: Medicare Other | Admitting: Family Medicine

## 2016-01-30 VITALS — BP 120/72 | HR 72 | Temp 98.9°F | Resp 20 | Ht 72.0 in | Wt 179.8 lb

## 2016-01-30 DIAGNOSIS — N400 Enlarged prostate without lower urinary tract symptoms: Secondary | ICD-10-CM

## 2016-01-30 DIAGNOSIS — H401132 Primary open-angle glaucoma, bilateral, moderate stage: Secondary | ICD-10-CM | POA: Diagnosis not present

## 2016-01-30 DIAGNOSIS — J069 Acute upper respiratory infection, unspecified: Secondary | ICD-10-CM | POA: Diagnosis not present

## 2016-01-30 DIAGNOSIS — H2513 Age-related nuclear cataract, bilateral: Secondary | ICD-10-CM | POA: Diagnosis not present

## 2016-01-30 MED ORDER — AMOXICILLIN 875 MG PO TABS: 875.0000 mg | ORAL_TABLET | Freq: Two times a day (BID) | ORAL | Status: DC

## 2016-01-30 MED ORDER — TAMSULOSIN HCL 0.4 MG PO CAPS: 0.4000 mg | ORAL_CAPSULE | Freq: Every day | ORAL | Status: DC

## 2016-01-30 NOTE — Progress Notes (Signed)
Patient ID: Douglas Russell, male    DOB: 1951/06/21  Age: 65 y.o. MRN: [id number]  Chief Complaint  Patient presents with  . prostate exam  . Cough  . URI    Subjective:   Patient is here with a respiratory tract infection since the day after Christmas. He is gotten worse this week, and had to miss yesterday and today. He has head congestion, some purulent drainage, slight cough, just feels bad.  He also wants his prostate gland checked. He has to get up multiple times at night to urinate. Reviewed his old records and his PSA has always been good. He is very health conscious and takes lots of supplements.  Current allergies, medications, problem list, past/family and social histories reviewed.  Objective:  BP 120/72 mmHg  Pulse 72  Temp(Src) 98.9 F (37.2 C) (Oral)  Resp 20  Ht 6' (1.829 m)  Wt 179 lb 12.8 oz (81.557 kg)  BMI 24.38 kg/m2  SpO2 96%  No major acute distress. TMs normal. Throat clear. Had a little congested. Neck supple without nodes. Chest clear to auscultation. Prostate gland moderately large but no nodules palpable. A little on the firm side.  Assessment & Plan:   Assessment: 1. BPH (benign prostatic hypertrophy)   2. Acute upper respiratory infection       Plan: Try some Flomax on him. Decided to go ahead and treat his sinus drainage with some amoxicillin to see if we can make him do better. He declined cough medicines at this time.  Orders Placed This Encounter  Procedures  . PSA    Meds ordered this encounter  Medications  . Garlic [id number] MG CAPS    Sig: Take by mouth.  Marland Kitchen amoxicillin (AMOXIL) 875 MG tablet    Sig: Take 1 tablet (875 mg total) by mouth 2 (two) times daily.    Dispense:  20 tablet    Refill:  0  . tamsulosin (FLOMAX) 0.4 MG CAPS capsule    Sig: Take 1 capsule (0.4 mg total) by mouth daily.    Dispense:  30 capsule    Refill:  3         Patient Instructions  Drink plenty of fluids, just not before bedtime  Take the  tamsulosin 0.4 mg 1 daily before bedtime for prostate  Take the amoxicillin 875 mg one twice daily for the respiratory tract infection  Return if worse or not improving  I will let you know the results of your prostate test     No Follow-up on file.   HOPPER,DAVID, MD 01/30/2016

## 2016-01-30 NOTE — Patient Instructions (Signed)
Drink plenty of fluids, just not before bedtime  Take the tamsulosin 0.4 mg 1 daily before bedtime for prostate  Take the amoxicillin 875 mg one twice daily for the respiratory tract infection  Return if worse or not improving  I will let you know the results of your prostate test

## 2016-01-31 LAB — PSA: PSA: 0.4 ng/mL (ref <=4.00)

## 2016-02-02 ENCOUNTER — Encounter: Payer: Self-pay | Admitting: *Deleted

## 2016-02-05 ENCOUNTER — Encounter (HOSPITAL_COMMUNITY): Payer: Self-pay | Admitting: Emergency Medicine

## 2016-02-05 ENCOUNTER — Emergency Department (HOSPITAL_COMMUNITY)
Admission: EM | Admit: 2016-02-05 | Discharge: 2016-02-05 | Disposition: A | Discharge status: Home / Self Care | Payer: Medicare Other | Attending: Emergency Medicine | Admitting: Emergency Medicine

## 2016-02-05 ENCOUNTER — Ambulatory Visit (INDEPENDENT_AMBULATORY_CARE_PROVIDER_SITE_OTHER): Payer: Medicare Other | Admitting: Family Medicine

## 2016-02-05 VITALS — BP 122/78 | HR 66 | Temp 98.2°F | Resp 20 | Ht 72.0 in | Wt 181.4 lb

## 2016-02-05 DIAGNOSIS — Z792 Long term (current) use of antibiotics: Secondary | ICD-10-CM | POA: Insufficient documentation

## 2016-02-05 DIAGNOSIS — L299 Pruritus, unspecified: Secondary | ICD-10-CM | POA: Diagnosis not present

## 2016-02-05 DIAGNOSIS — Z7982 Long term (current) use of aspirin: Secondary | ICD-10-CM | POA: Diagnosis not present

## 2016-02-05 DIAGNOSIS — R21 Rash and other nonspecific skin eruption: Secondary | ICD-10-CM | POA: Insufficient documentation

## 2016-02-05 DIAGNOSIS — Z79899 Other long term (current) drug therapy: Secondary | ICD-10-CM | POA: Diagnosis not present

## 2016-02-05 DIAGNOSIS — L509 Urticaria, unspecified: Secondary | ICD-10-CM

## 2016-02-05 DIAGNOSIS — J45909 Unspecified asthma, uncomplicated: Secondary | ICD-10-CM | POA: Insufficient documentation

## 2016-02-05 DIAGNOSIS — M1990 Unspecified osteoarthritis, unspecified site: Secondary | ICD-10-CM | POA: Diagnosis not present

## 2016-02-05 DIAGNOSIS — R239 Unspecified skin changes: Secondary | ICD-10-CM

## 2016-02-05 DIAGNOSIS — Z8701 Personal history of pneumonia (recurrent): Secondary | ICD-10-CM | POA: Insufficient documentation

## 2016-02-05 MED ORDER — PREDNISONE 20 MG PO TABS: ORAL_TABLET | ORAL | Status: DC

## 2016-02-05 MED ORDER — DIPHENHYDRAMINE HCL 25 MG PO TABS: 25.0000 mg | ORAL_TABLET | Freq: Four times a day (QID) | ORAL | Status: DC

## 2016-02-05 NOTE — ED Provider Notes (Signed)
CSN: [id number]     Arrival date & time 02/05/16  0002 History   First MD Initiated Contact with Patient 02/05/16 [phone]     Chief Complaint  Patient presents with  . Rash   (Consider location/radiation/quality/duration/timing/severity/associated sxs/prior Treatment) The history is provided by the patient. No language interpreter was used.    Mr. Douglas Russell is a 65 year old male with a past medical history of asthma who reports rashes to the hands, arms, and bilateral axilla with pruritus that began suddenly after moving boxes while working at [employer] He states he was put on 2 new medications recently, one of which was amoxicillin for URI and another for urinary frequency. He has been taking these medications with no problems for at least 5 days. He reports that the rash resolved on its own. He says he moved snake away boxes as well as other cardboard boxes but did not get any powder on him. He denies any fever. No treatment prior to arrival.   Past Medical History  Diagnosis Date  . Glaucoma   . Asthma     has had since he was a child, states he only uses the inhaler PRN  . Pneumonia     in hosp[facility] ( /w pelvic fx)-   . Arthritis     L&R hip & pelvis   . History of blood transfusion     /w pelvic fx.   . Glaucoma     both eyes  . Osteoarthritis of left hip 10/10/2014  . Cataract   . Hx of adenomatous polyp of colon 01/25/2015   Past Surgical History  Procedure Laterality Date  . Pelvic fracture surgery  1995  . Fracture surgery Right 1995    hip  . Hernia repair  123456    umbilical   . Back cyst  2006    surgery x4 for cyst on the back   . Total hip arthroplasty Left 10/10/2014    dr Mardelle Matte  . Total hip arthroplasty Left 10/10/2014    Procedure: TOTAL HIP ARTHROPLASTY;  Surgeon: Johnny Bridge, MD;  Location: [facility];  Service: Orthopedics;  Laterality: Left;  Marland Kitchen Eye surgery Bilateral March 2016    Dr. Marylynn Pearson, Jr   Family History  Problem Relation Age of Onset  .  Hypertension Mother   . Diabetes Father   . Hypertension Sister   . Cancer Maternal Grandmother   . Hypertension Sister   . Colon cancer Neg Hx    Social History  Substance Use Topics  . Smoking status: Never Smoker   . Smokeless tobacco: Never Used  . Alcohol Use: 0.0 oz/week    0 Standard drinks or equivalent per week     Comment: occasional glass of wine    Review of Systems  Constitutional: Negative for fever.  Skin: Positive for rash.      Allergies  Shellfish allergy and Trazodone and nefazodone  Home Medications   Prior to Admission medications   Medication Sig Start Date End Date Taking? Authorizing Provider  amoxicillin (AMOXIL) 875 MG tablet Take 1 tablet (875 mg total) by mouth 2 (two) times daily. 01/30/16   Posey Boyer, MD  aspirin EC 81 MG tablet Take 81 mg by mouth daily.    Historical Provider, MD  CINNAMON PO Take 1 capsule by mouth daily.    Historical Provider, MD  Cranberry 500 MG CAPS Take 500 mg by mouth daily. Reported on 01/30/2016    Historical Provider, MD  diphenhydrAMINE (  BENADRYL) 25 MG tablet Take 1 tablet (25 mg total) by mouth every 6 (six) hours. 02/05/16   Hamid Brookens Patel-Mills, PA-C  fish oil-omega-3 fatty acids 1000 MG capsule Take 1 g by mouth daily.    Historical Provider, MD  Fluticasone-Salmeterol (ADVAIR DISKUS) 250-50 MCG/DOSE AEPB Inhale 1 puff into the lungs 2 (two) times daily. Patient not taking: Reported on 01/30/2016 11/12/15   Posey Boyer, MD  Garlic [id number] MG CAPS Take by mouth.    Historical Provider, MD  Glucosamine HCl 1000 MG TABS Take 1,000 mg by mouth 2 (two) times daily.    Historical Provider, MD  L-ARGININE PO Take 2 tablets by mouth 2 (two) times daily. Reported on 01/30/2016    Historical Provider, MD  Melatonin 10 MG TABS Take 10 mg by mouth at bedtime.    Historical Provider, MD  methocarbamol (ROBAXIN) 500 MG tablet Take 1 in the morning, 1 in the afternoon, and 2 at bedtime as needed for muscle relaxant. Patient not  taking: Reported on 01/30/2016 07/06/15   Posey Boyer, MD  Multiple Vitamin (MULITIVITAMIN WITH MINERALS) TABS Take 1 tablet by mouth daily.    Historical Provider, MD  Saw Palmetto, Serenoa repens, 1000 MG CAPS Take 1 capsule by mouth. Reported on 01/30/2016    Historical Provider, MD  tamsulosin (FLOMAX) 0.4 MG CAPS capsule Take 1 capsule (0.4 mg total) by mouth daily. 01/30/16   Posey Boyer, MD  Travoprost, BAK Free, (TRAVATAN) 0.004 % SOLN ophthalmic solution Place 1 drop into both eyes at bedtime. Reported on 01/30/2016    Historical Provider, MD  Turmeric Curcumin 500 MG CAPS Take 500 mg by mouth daily.    Historical Provider, MD  valACYclovir (VALTREX) 500 MG tablet Take 1 tablet (500 mg total) by mouth 2 (two) times daily. For 3 days per each outbreak Patient not taking: Reported on 01/30/2016 02/24/14   Posey Boyer, MD  Valerian Root 450 MG CAPS Take 1,350 mg by mouth at bedtime.    Historical Provider, MD  zolpidem (AMBIEN) 5 MG tablet Take 0.5-1 tablets (2.5-5 mg total) by mouth at bedtime as needed for sleep. Patient not taking: Reported on 01/30/2016 11/30/15   Posey Boyer, MD   BP 125/77 mmHg  Pulse 79  Temp(Src) 98.3 F (36.8 C) (Oral)  Resp 18  Wt 81.307 kg  SpO2 100% Physical Exam  Constitutional: He is oriented to person, place, and time. He appears well-developed and well-nourished.  HENT:  Head: Normocephalic and atraumatic.  Eyes: Conjunctivae are normal.  Neck: Normal range of motion. Neck supple.  Cardiovascular: Normal rate.   Pulmonary/Chest: Effort normal and breath sounds normal. No respiratory distress.  No shortness of breath, lip or tongue swelling. Well-appearing and in no acute distress.  Musculoskeletal: Normal range of motion.  Neurological: He is alert and oriented to person, place, and time.  Skin: Skin is warm and dry. No rash noted.  No rash.  Psychiatric: He has a normal mood and affect.  Nursing note and vitals reviewed.   ED Course  Procedures  (including critical care time) Labs Review Labs Reviewed - No data to display  Imaging Review No results found.    EKG Interpretation None      MDM   Final diagnoses:  Recent skin changes   Patient presents for rash on bilateral upper extremities after moving boxes while at work. No treatment prior to arrival. He states the rash resolved while waiting to be same. He has  no exam findings. No shortness of breath or respiratory distress. No rash on exam. I discussed taking Benadryl if the rash returned. I do not believe this is related to the medications that he has been taking for several days now. I explained he should avoid whatever ingredients were in the box.  Discussed return precautions and patient agrees with plan. Filed Vitals:   02/05/16 0032  BP: 125/77  Pulse: 79  Temp: 98.3 F (36.8 C)  Resp: 18   Medications - No data to display     Ottie Glazier, PA-C 02/05/16 0138  Sharlett Iles, MD 02/05/16 1505

## 2016-02-05 NOTE — Patient Instructions (Addendum)
Take Zyrtec (cetirizine) one daily for the next few days  If you see any side of the hives also take Zantac (ranitidine) 150 mg twice daily. This can also be purchased over-the-counter  If you are having worsening hives go ahead and take prednisone 20 mg 3 daily for 2 days, then 2 daily for 2 days, then 1 daily for 2 days. If you do not in depth needing it please just to help the prescription.  Return or go to the emergency room again if worse  We need to consider you to be allergic to penicillin and stay off of the penicillins/amoxicillin

## 2016-02-05 NOTE — ED Notes (Signed)
Rash resolved while waiting, see PA note

## 2016-02-05 NOTE — ED Notes (Signed)
Pt. reports itchy skin rashes at hands , arms and bilateral axilla onset this evening , pt. is taking Amoxicillin for URI , respirations unlabored /airway intact , denies fever or chills.

## 2016-02-05 NOTE — Progress Notes (Signed)
Patient ID: Douglas Russell, male    DOB: August 28, 1951  Age: 65 y.o. MRN: [id number]  Chief Complaint  Patient presents with  . 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 He got a rash of hives on his arms. He is been on the amoxicillin for a few days and the tamsulosin. He went to the emergency room. 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 The hospital just on his arms. He had labs go away before he got to the emergency room, but on the way home and broke out again. This morning he broke off a third time. He was told to take Benadryl by the emergency room. No breathing problems or other symptoms. He never has had hives before.  Current allergies, medications, problem list, past/family and social histories reviewed.  Objective:  BP 122/78 mmHg  Pulse 66  Temp(Src) 98.2 F (36.8 C) (Oral)  Resp 20  Ht 6' (1.829 m)  Wt 181 lb 6.4 oz (82.283 kg)  BMI 24.60 kg/m2  SpO2 97%  No acute distress. Chest clear. Heart regular without murmurs. No hives visible on the arms or trunk.  Assessment & Plan:   Assessment: 1. Hives       Plan: Probably a penicillin allergic reaction. She came no that for sure. He is to [name] gets hives again. See instructions.  No orders of the defined types were placed in this encounter.    Meds ordered this encounter  Medications  . predniSONE (DELTASONE) 20 MG tablet    Sig: Take 3 daily for 2 days, then 2 daily for 2 days, then 1 daily for 2 days for hives    Dispense:  12 tablet    Refill:  0         Patient Instructions  Take Zyrtec (cetirizine) one daily for the next few days  If you see any side of the hives also take Zantac (ranitidine) 150 mg twice daily. This can also be purchased over-the-counter  If you are having worsening hives go ahead and take prednisone 20 mg 3 daily for 2 days, then 2 daily  for 2 days, then 1 daily for 2 days. If you do not in depth needing it please just to help the prescription.  Return or go to the emergency room again if worse  We need to consider you to be allergic to penicillin and stay off of the penicillins/amoxicillin    No Follow-up on file.   HOPPER,DAVID, MD 02/05/2016

## 2016-04-27 ENCOUNTER — Telehealth: Payer: Self-pay | Admitting: Family Medicine

## 2016-04-27 NOTE — Telephone Encounter (Signed)
Pt came in and dropped of an application for Renewal of Disability Parking Placard. Provided pt with copy of form, left incomplete form and [employer] (Non-FMLA form) in Nurse's box. - KR

## 2016-04-29 NOTE — Telephone Encounter (Signed)
In your box

## 2016-08-15 DIAGNOSIS — H2513 Age-related nuclear cataract, bilateral: Secondary | ICD-10-CM | POA: Diagnosis not present

## 2016-08-15 DIAGNOSIS — H401132 Primary open-angle glaucoma, bilateral, moderate stage: Secondary | ICD-10-CM | POA: Diagnosis not present

## 2016-08-26 ENCOUNTER — Other Ambulatory Visit: Payer: Self-pay

## 2016-12-12 DIAGNOSIS — H2513 Age-related nuclear cataract, bilateral: Secondary | ICD-10-CM | POA: Diagnosis not present

## 2016-12-12 DIAGNOSIS — H401132 Primary open-angle glaucoma, bilateral, moderate stage: Secondary | ICD-10-CM | POA: Diagnosis not present

## 2016-12-15 DIAGNOSIS — M1612 Unilateral primary osteoarthritis, left hip: Secondary | ICD-10-CM | POA: Diagnosis not present

## 2017-03-28 ENCOUNTER — Ambulatory Visit (INDEPENDENT_AMBULATORY_CARE_PROVIDER_SITE_OTHER): Payer: Medicare Other | Admitting: Family Medicine

## 2017-03-28 ENCOUNTER — Encounter: Payer: Self-pay | Admitting: Family Medicine

## 2017-03-28 VITALS — BP 127/77 | HR 71 | Resp 16 | Wt 179.4 lb

## 2017-03-28 DIAGNOSIS — R351 Nocturia: Secondary | ICD-10-CM

## 2017-03-28 DIAGNOSIS — J4520 Mild intermittent asthma, uncomplicated: Secondary | ICD-10-CM

## 2017-03-28 DIAGNOSIS — G4700 Insomnia, unspecified: Secondary | ICD-10-CM

## 2017-03-28 LAB — POCT URINALYSIS DIP (MANUAL ENTRY)
BILIRUBIN UA: NEGATIVE
BILIRUBIN UA: NEGATIVE
Blood, UA: NEGATIVE
Glucose, UA: NEGATIVE
LEUKOCYTES UA: NEGATIVE
NITRITE UA: NEGATIVE
PH UA: 6.5 (ref 5.0–8.0)
PROTEIN UA: NEGATIVE
Spec Grav, UA: 1.02 (ref 1.030–1.035)
Urobilinogen, UA: 0.2 (ref ?–2.0)

## 2017-03-28 LAB — POC MICROSCOPIC URINALYSIS (UMFC): MUCUS RE: ABSENT

## 2017-03-28 MED ORDER — TAMSULOSIN HCL 0.4 MG PO CAPS: 0.4000 mg | ORAL_CAPSULE | Freq: Every day | ORAL | 3 refills | Status: DC

## 2017-03-28 MED ORDER — FLUTICASONE PROPIONATE HFA 44 MCG/ACT IN AERO: 2.0000 | INHALATION_SPRAY | Freq: Two times a day (BID) | RESPIRATORY_TRACT | 3 refills | Status: DC

## 2017-03-28 MED ORDER — ALBUTEROL SULFATE HFA 108 (90 BASE) MCG/ACT IN AERS: 2.0000 | INHALATION_SPRAY | Freq: Four times a day (QID) | RESPIRATORY_TRACT | 0 refills | Status: DC | PRN

## 2017-03-28 NOTE — Patient Instructions (Addendum)
  It may be beneficial to meet with sleep specialist to look into your sleep issues further. However, with urinary symptoms - it would be best to start with meeting with a urologist to discuss how frequently you are having to urinate at night. Start Flomax once per day. If no improvement in sleep with treating urinary symptoms, let me know and I will refer you to sleep specialist.  Based on the description of your asthma symptoms, you do not need to be on Advair at this time. If any wheezing/shortness of breath, or flare in asthma symptoms, try albuterol inhaler up to every 4-6 hours as needed. If you require that medicine more than 2 times per week, or any nighttime symptoms, start the Flovent inhaler that was printed. If you still have persistent need for albuterol, return to discuss other medications.    IF you received an x-ray today, you will receive an invoice from [facility]. Please contact [facility] at [phone] with questions or concerns regarding your invoice.   IF you received labwork today, you will receive an invoice from [place]. Please contact [facility] at [phone] with questions or concerns regarding your invoice.   Our billing staff will not be able to assist you with questions regarding bills from these companies.  You will be contacted with the lab results as soon as they are available. The fastest way to get your results is to activate your My Chart account. Instructions are located on the last page of this paperwork. If you have not heard from [place] regarding the results in 2 weeks, please contact this office.

## 2017-03-28 NOTE — Progress Notes (Signed)
Subjective:  By signing my name below, I, Essence Howell, attest that this documentation has been prepared under the direction and in the presence of Wendie Agreste, MD Electronically Signed: Ladene Artist, ED Scribe 03/28/2017 at 9:26 AM.   Patient ID: Douglas Russell, male    DOB: 01/22/1951, 66 y.o.   MRN: [id number] Chief Complaint  Patient presents with  . prostate exam   HPI Douglas Russell is a 66 y.o. male who presents to Primary Care at [facility]. New pt to me. Previously followed by Dr. Linna Darner. H/o BPH. Seen 01/30/16 with Nocturia treated with Flomax 0.4 mg qd.  Lab Results  Component Value Date   PSA 0.40 01/30/2016   PSA 0.45 05/11/2015   PSA 0.58 01/11/2013   Pt states that he gets up ~3 times/night to urinate. He stops food/drink intake 2-3 hours prior to going to bed. He consumes decaffeinated green tea and healthy greens at night. Pt states that he briefly took Flomax for a few weeks but did not notice significant relief, however, he is willing to restart the medication. Pt denies any side effects of the medication. Pt also denies daytime urinary frequency or hematuria. No family h/o prostate CA.  Insomnia  Pt states that he is able to fall asleep within 35-40 minutes but once he gets up to urinate, he has a difficult time getting back to sleep. He has tried Somnapure natural sleep aid without significant relief. Pt has also had 2 sleep studies done in April 1996 and April 2006, that he states showed that he never entered into REM. No h/o sleep apnea or restless leg syndrome. Pt states that he starts a new shift on 03/30/17 from 10 PM-7 AM since he is unable to sleep during that time anyway.   Health Maintenance   Pt's last colonoscopy was 2016. His Pneumonia vaccine is UTD. Pt received his flu vaccine at [facility] in the middle of September 2017.   Refill Pt requests a refill of Advair 250-50 for asthma which he takes sparingly. He states that his asthma flares up during the  winter months. He does not have an albuterol inhaler at home.   Patient Active Problem List   Diagnosis Date Noted  . Hx of adenomatous polyp of colon 01/25/2015  . Status post left hip replacement 10/13/2014  . Asthma, chronic 10/13/2014  . Osteoarthritis of left hip 10/10/2014  . Hip arthritis 10/10/2014  . History of asthma 08/08/2014  . History of umbilical hernia 10/22/8526  . History of pelvic fracture 08/08/2014   Past Medical History:  Diagnosis Date  . Arthritis    L&R hip & pelvis   . Asthma    has had since he was a child, states he only uses the inhaler PRN  . Cataract   . Glaucoma   . Glaucoma    both eyes  . History of blood transfusion    /w pelvic fx.   Marland Kitchen Hx of adenomatous polyp of colon 01/25/2015  . Osteoarthritis of left hip 10/10/2014  . Pneumonia    in hosp.,[facility] ( /w pelvic fx)-    Past Surgical History:  Procedure Laterality Date  . back cyst  2006   surgery x4 for cyst on the back   . EYE SURGERY Bilateral March 2016   Dr. Marylynn Pearson, [place]  . FRACTURE SURGERY Right 1995   hip  . HERNIA REPAIR  7824   umbilical   . PELVIC FRACTURE SURGERY  1995  .  TOTAL HIP ARTHROPLASTY Left 10/10/2014   dr Mardelle Matte  . TOTAL HIP ARTHROPLASTY Left 10/10/2014   Procedure: TOTAL HIP ARTHROPLASTY;  Surgeon: Johnny Bridge, MD;  Location: [place];  Service: Orthopedics;  Laterality: Left;   Allergies  Allergen Reactions  . Shellfish Allergy Anaphylaxis and Swelling    Throat swells closed," like there is a block of cement in my throat"   . Amoxicillin     Hives ? From amoxicillin vs. Chemicals he was in contact with that shift  . Trazodone And Nefazodone Anxiety   Prior to Admission medications   Medication Sig Start Date End Date Taking? Authorizing Provider  amoxicillin (AMOXIL) 875 MG tablet Take 1 tablet (875 mg total) by mouth 2 (two) times daily. Patient not taking: Reported on 02/05/2016 01/30/16   Posey Boyer, MD  aspirin EC 81 MG tablet Take 81 mg by  mouth daily.    Historical Provider, MD  CINNAMON PO Take 1 capsule by mouth daily.    Historical Provider, MD  Cranberry 500 MG CAPS Take 500 mg by mouth daily. Reported on 01/30/2016    Historical Provider, MD  diphenhydrAMINE (BENADRYL) 25 MG tablet Take 1 tablet (25 mg total) by mouth every 6 (six) hours. 02/05/16   Hanna Patel-Mills, PA-C  fish oil-omega-3 fatty acids 1000 MG capsule Take 1 g by mouth daily.    Historical Provider, MD  Fluticasone-Salmeterol (ADVAIR DISKUS) 250-50 MCG/DOSE AEPB Inhale 1 puff into the lungs 2 (two) times daily. 11/12/15   Posey Boyer, MD  Garlic 0932 MG CAPS Take by mouth.    Historical Provider, MD  Glucosamine HCl 1000 MG TABS Take 1,000 mg by mouth 2 (two) times daily.    Historical Provider, MD  L-ARGININE PO Take 2 tablets by mouth 2 (two) times daily. Reported on 01/30/2016    Historical Provider, MD  Melatonin 10 MG TABS Take 10 mg by mouth at bedtime.    Historical Provider, MD  methocarbamol (ROBAXIN) 500 MG tablet Take 1 in the morning, 1 in the afternoon, and 2 at bedtime as needed for muscle relaxant. Patient not taking: Reported on 01/30/2016 07/06/15   Posey Boyer, MD  Multiple Vitamin (MULITIVITAMIN WITH MINERALS) TABS Take 1 tablet by mouth daily.    Historical Provider, MD  predniSONE (DELTASONE) 20 MG tablet Take 3 daily for 2 days, then 2 daily for 2 days, then 1 daily for 2 days for hives 02/05/16   Posey Boyer, MD  Saw Palmetto, Serenoa repens, 1000 MG CAPS Take 1 capsule by mouth. Reported on 02/05/2016    Historical Provider, MD  tamsulosin (FLOMAX) 0.4 MG CAPS capsule Take 1 capsule (0.4 mg total) by mouth daily. 01/30/16   Posey Boyer, MD  Travoprost, BAK Free, (TRAVATAN) 0.004 % SOLN ophthalmic solution Place 1 drop into both eyes at bedtime. Reported on 01/30/2016    Historical Provider, MD  Turmeric Curcumin 500 MG CAPS Take 500 mg by mouth daily.    Historical Provider, MD  valACYclovir (VALTREX) 500 MG tablet Take 1 tablet (500 mg total)  by mouth 2 (two) times daily. For 3 days per each outbreak Patient not taking: Reported on 01/30/2016 02/24/14   Posey Boyer, MD  Valerian Root 450 MG CAPS Take 1,350 mg by mouth at bedtime.    Historical Provider, MD  zolpidem (AMBIEN) 5 MG tablet Take 0.5-1 tablets (2.5-5 mg total) by mouth at bedtime as needed for sleep. Patient not taking: Reported on 01/30/2016  11/30/15   Posey Boyer, MD   Social History   Social History  . Marital status: Divorced    Spouse name: N/A  . Number of children: N/A  . Years of education: N/A   Occupational History  . Not on file.   Social History Main Topics  . Smoking status: Never Smoker  . Smokeless tobacco: Never Used  . Alcohol use 0.0 oz/week     Comment: occasional glass of wine  . Drug use: No  . Sexual activity: Yes   Other Topics Concern  . Not on file   Social History Narrative  . No narrative on file   Review of Systems  Genitourinary: Positive for frequency. Negative for hematuria.  Psychiatric/Behavioral: Positive for sleep disturbance.      Objective:   Physical Exam  Constitutional: He is oriented to person, place, and time. He appears well-developed and well-nourished.  HENT:  Head: Normocephalic and atraumatic.  Eyes: EOM are normal. Pupils are equal, round, and reactive to light.  Neck: No JVD present. Carotid bruit is not present.  Cardiovascular: Normal rate, regular rhythm and normal heart sounds.   No murmur heard. Pulmonary/Chest: Effort normal and breath sounds normal. He has no wheezes. He has no rales.  Genitourinary:  Genitourinary Comments: Prostate small/normal sized. No nodules. Non tender.   Musculoskeletal: He exhibits no edema.  Neurological: He is alert and oriented to person, place, and time.  Skin: Skin is warm and dry.  Psychiatric: He has a normal mood and affect.  Vitals reviewed.  Vitals:   03/28/17 0916  BP: 127/77  Pulse: 71  Resp: 16  SpO2: 98%  Weight: 179 lb 6.4 oz (81.4 kg)       Assessment & Plan:   Douglas Russell is a 66 y.o. male Nocturia - Plan: POCT Microscopic Urinalysis ([facility]), POCT urinalysis dipstick, PSA, tamsulosin (FLOMAX) 0.4 MG CAPS capsule, Ambulatory referral to Urology  - Long-standing issue. Try Flomax, refer to urology for evaluation. Check PSA.  Mild intermittent asthma without complication - Plan: fluticasone (FLOVENT HFA) 44 MCG/ACT inhaler, albuterol (PROVENTIL HFA;VENTOLIN HFA) 108 (90 Base) MCG/ACT inhaler  - Based on Advair dosing, likely does not need that strong of medication. Symptoms appear to be mild/intermittent. Start albuterol inhaler as needed for wheezing/breakthrough asthma symptoms. If increased need as listed in patient instructions, start Flovent inhaler 2 puffs twice a day as maintenance medication. RTC precautions if worsening control.  Insomnia, unspecified type  - Likely due to nocturia, but has also had sleep studies in the past indicating lack of deep sleep. Start with treatment of nocturia, then consider referral to sleep specialist.  Recheck in next 3 months. Health maintenance was reviewed, appears to be up-to-date at this time.  Meds ordered this encounter  Medications  . fluticasone (FLOVENT HFA) 44 MCG/ACT inhaler    Sig: Inhale 2 puffs into the lungs 2 (two) times daily.    Dispense:  1 Inhaler    Refill:  3  . albuterol (PROVENTIL HFA;VENTOLIN HFA) 108 (90 Base) MCG/ACT inhaler    Sig: Inhale 2 puffs into the lungs every 6 (six) hours as needed for wheezing or shortness of breath.    Dispense:  1 Inhaler    Refill:  0  . tamsulosin (FLOMAX) 0.4 MG CAPS capsule    Sig: Take 1 capsule (0.4 mg total) by mouth daily.    Dispense:  30 capsule    Refill:  3   Patient Instructions  It may be beneficial to meet with sleep specialist to look into your sleep issues further. However, with urinary symptoms - it would be best to start with meeting with a urologist to discuss how frequently you are having to  urinate at night. Start Flomax once per day. If no improvement in sleep with treating urinary symptoms, let me know and I will refer you to sleep specialist.  Based on the description of your asthma symptoms, you do not need to be on Advair at this time. If any wheezing/shortness of breath, or flare in asthma symptoms, try albuterol inhaler up to every 4-6 hours as needed. If you require that medicine more than 2 times per week, or any nighttime symptoms, start the Flovent inhaler that was printed. If you still have persistent need for albuterol, return to discuss other medications.    IF you received an x-ray today, you will receive an invoice from [facility]. Please contact [facility] at [phone] with questions or concerns regarding your invoice.   IF you received labwork today, you will receive an invoice from [place]. Please contact [facility] at [phone] with questions or concerns regarding your invoice.   Our billing staff will not be able to assist you with questions regarding bills from these companies.  You will be contacted with the lab results as soon as they are available. The fastest way to get your results is to activate your My Chart account. Instructions are located on the last page of this paperwork. If you have not heard from [place] regarding the results in 2 weeks, please contact this office.       I personally performed the services described in this documentation, which was scribed in my presence. The recorded information has been reviewed and considered for accuracy and completeness, addended by me as needed, and agree with information above.  Signed,   Merri Ray, MD Primary Care at [facility].  03/28/17 9:53 AM

## 2017-03-29 LAB — PSA: PROSTATE SPECIFIC AG, SERUM: 0.6 ng/mL (ref 0.0–4.0)

## 2017-03-31 ENCOUNTER — Other Ambulatory Visit: Payer: Self-pay

## 2017-03-31 DIAGNOSIS — J4520 Mild intermittent asthma, uncomplicated: Secondary | ICD-10-CM

## 2017-03-31 MED ORDER — ALBUTEROL SULFATE HFA 108 (90 BASE) MCG/ACT IN AERS: 2.0000 | INHALATION_SPRAY | Freq: Four times a day (QID) | RESPIRATORY_TRACT | 0 refills | Status: DC | PRN

## 2017-04-04 ENCOUNTER — Encounter: Payer: Self-pay | Admitting: Family Medicine

## 2017-05-26 DIAGNOSIS — H2513 Age-related nuclear cataract, bilateral: Secondary | ICD-10-CM | POA: Diagnosis not present

## 2017-05-26 DIAGNOSIS — H401132 Primary open-angle glaucoma, bilateral, moderate stage: Secondary | ICD-10-CM | POA: Diagnosis not present

## 2017-07-20 ENCOUNTER — Encounter: Payer: Self-pay | Admitting: Family Medicine

## 2017-07-20 ENCOUNTER — Ambulatory Visit (INDEPENDENT_AMBULATORY_CARE_PROVIDER_SITE_OTHER): Payer: Medicare Other

## 2017-07-20 ENCOUNTER — Ambulatory Visit (INDEPENDENT_AMBULATORY_CARE_PROVIDER_SITE_OTHER): Payer: Medicare Other | Admitting: Family Medicine

## 2017-07-20 VITALS — BP 145/84 | HR 71 | Temp 98.5°F | Resp 18 | Ht 69.29 in | Wt 182.0 lb

## 2017-07-20 DIAGNOSIS — M25511 Pain in right shoulder: Secondary | ICD-10-CM

## 2017-07-20 DIAGNOSIS — M19012 Primary osteoarthritis, left shoulder: Secondary | ICD-10-CM | POA: Diagnosis not present

## 2017-07-20 MED ORDER — MELOXICAM 7.5 MG PO TABS: 7.5000 mg | ORAL_TABLET | Freq: Every day | ORAL | 0 refills | Status: DC

## 2017-07-20 NOTE — Patient Instructions (Addendum)
Overall your x-ray looked okay. Only minimal arthritis. Try meloxicam 1 pill once per day. Relative rest for the next few days, then as long as you are improving okay to return on Thursday. If pain continues on Thursday, follow-up with me Friday or Saturday morning and we can discuss further treatment. If you are improving, okay to continue taking meloxicam if needed up to 2 weeks. Follow-up if refill needed.    IF you received an x-ray today, you will receive an invoice from [facility]. Please contact [facility] at [phone] with questions or concerns regarding your invoice.   IF you received labwork today, you will receive an invoice from [place]. Please contact [facility] at [phone] with questions or concerns regarding your invoice.   Our billing staff will not be able to assist you with questions regarding bills from these companies.  You will be contacted with the lab results as soon as they are available. The fastest way to get your results is to activate your My Chart account. Instructions are located on the last page of this paperwork. If you have not heard from [place] regarding the results in 2 weeks, please contact this office.

## 2017-07-20 NOTE — Progress Notes (Signed)
Subjective:  By signing my name below, I, Essence Howell, attest that this documentation has been prepared under the direction and in the presence of Wendie Agreste, MD Electronically Signed: Ladene Artist, ED Scribe 07/20/2017 at 4:48 PM.   Patient ID: Douglas Russell, male    DOB: 1951/05/24, 66 y.o.   MRN: [id number]  Chief Complaint  Patient presents with  . Shoulder Pain    have knot on the shoulder x 6 months now giving him pain    HPI Douglas Russell is a 66 y.o. male who presents to Primary Care at [facility] complaining of a gradually enlarging nodule to the right shoulder first noticed 6 months ago. Pt states he has been monitoring the knot but has noticed a change in size and soreness over the past 2-3 days. No treatments tried PTA. He denies fever, chills, night sweats, unexplained weight loss, injury, fall. Pt is right hand dominant.   Patient Active Problem List   Diagnosis Date Noted  . Hx of adenomatous polyp of colon 01/25/2015  . Status post left hip replacement 10/13/2014  . Asthma, chronic 10/13/2014  . Osteoarthritis of left hip 10/10/2014  . Hip arthritis 10/10/2014  . History of asthma 08/08/2014  . History of umbilical hernia 76/72/0947  . History of pelvic fracture 08/08/2014   Past Medical History:  Diagnosis Date  . Arthritis    L&R hip & pelvis   . Asthma    has had since he was a child, states he only uses the inhaler PRN  . Cataract   . Glaucoma   . Glaucoma    both eyes  . History of blood transfusion    /w pelvic fx.   Marland Kitchen Hx of adenomatous polyp of colon 01/25/2015  . Osteoarthritis of left hip 10/10/2014  . Pneumonia    in hosp.,[facility] ( /w pelvic fx)-    Past Surgical History:  Procedure Laterality Date  . back cyst  2006   surgery x4 for cyst on the back   . EYE SURGERY Bilateral March 2016   Dr. Marylynn Pearson, [place]  . FRACTURE SURGERY Right 1995   hip  . HERNIA REPAIR  0962   umbilical   . PELVIC FRACTURE SURGERY  1995  . TOTAL HIP  ARTHROPLASTY Left 10/10/2014   dr Mardelle Matte  . TOTAL HIP ARTHROPLASTY Left 10/10/2014   Procedure: TOTAL HIP ARTHROPLASTY;  Surgeon: Johnny Bridge, MD;  Location: [place];  Service: Orthopedics;  Laterality: Left;   Allergies  Allergen Reactions  . Shellfish Allergy Anaphylaxis and Swelling    Throat swells closed," like there is a block of cement in my throat"   . Amoxicillin     Hives ? From amoxicillin vs. Chemicals he was in contact with that shift  . Trazodone And Nefazodone Anxiety   Prior to Admission medications   Medication Sig Start Date End Date Taking? Authorizing Provider  albuterol (PROVENTIL HFA;VENTOLIN HFA) 108 (90 Base) MCG/ACT inhaler Inhale 2 puffs into the lungs every 6 (six) hours as needed for wheezing or shortness of breath. Ok to fill with preferred pro air 03/31/17   Wendie Agreste, MD  aspirin EC 81 MG tablet Take 81 mg by mouth daily.    [provider]  CINNAMON PO Take 1 capsule by mouth daily.    [provider]  fish oil-omega-3 fatty acids 1000 MG capsule Take 1 g by mouth daily.    [provider]  fluticasone ([place] HFA)  44 MCG/ACT inhaler Inhale 2 puffs into the lungs 2 (two) times daily. 03/28/17   Wendie Agreste, MD  Garlic 1448 MG CAPS Take by mouth.    [provider]  L-ARGININE PO Take 2 tablets by mouth 2 (two) times daily. Reported on 01/30/2016    [provider]  Melatonin 10 MG TABS Take 10 mg by mouth at bedtime.    [provider]  Multiple Vitamin (MULITIVITAMIN WITH MINERALS) TABS Take 1 tablet by mouth daily.    [provider]  tamsulosin (FLOMAX) 0.4 MG CAPS capsule Take 1 capsule (0.4 mg total) by mouth daily. 03/28/17   Wendie Agreste, MD  Travoprost, BAK Free, (TRAVATAN) 0.004 % SOLN ophthalmic solution Place 1 drop into both eyes at bedtime. Reported on 01/30/2016    [provider]  Turmeric Curcumin 500 MG CAPS Take 500 mg by mouth daily.    [provider]  valACYclovir (VALTREX) 500 MG tablet Take 1 tablet (500 mg total) by mouth 2 (two) times daily. For 3 days per each outbreak Patient not taking: Reported on 01/30/2016 02/24/14   Posey Boyer, MD  Valerian Root 450 MG CAPS Take 1,350 mg by mouth at bedtime.    [provider]   Social History   Social History  . Marital status: Divorced    Spouse name: N/A  . Number of children: N/A  . Years of education: N/A   Occupational History  . Not on file.   Social History Main Topics  . Smoking status: Never Smoker  . Smokeless tobacco: Never Used  . Alcohol use 0.0 oz/week     Comment: occasional glass of wine  . Drug use: No  . Sexual activity: Yes   Other Topics Concern  . Not on file   Social History Narrative  . No narrative on file   Review of Systems  Constitutional: Negative for chills, diaphoresis, fever and unexpected weight change.  Musculoskeletal: Positive for arthralgias.      Objective:   Physical Exam  Constitutional: He is oriented to person, place, and time. He appears well-developed and well-nourished. No distress.  HENT:  Head: Normocephalic and atraumatic.  Eyes: Conjunctivae and EOM are normal.  Neck: Neck supple. No tracheal deviation present.  Cardiovascular: Normal rate.   Pulmonary/Chest: Effort normal. No respiratory distress.  Musculoskeletal: Normal range of motion.  Prominence over the R AC joint with rounded nodular area. Slightly tender on the anterior aspect. More prominent than the L. Skin intact. No redness or rash. FROM. Full rotator cuff strength. Negative cross-over. Negative Hawkin's. Negative Neer's. Lathrop and clavicle non-tender.  Neurological: He is alert and oriented to person, place, and time.  Skin: Skin is warm and dry.  Psychiatric: He has a normal mood and affect. His behavior is normal.  Nursing note and vitals reviewed.  Vitals:   07/20/17 1701  BP: (!) 145/84  Pulse: 71  Resp: 18  Temp: 98.5 F  (36.9 C)  TempSrc: Oral  SpO2: 95%  Weight: 182 lb (82.6 kg)  Height: 5' 9.29" (1.76 m)   Dg Ac Joints  Result Date: 07/20/2017 CLINICAL DATA:  Joint pain and swelling EXAM: LEFT ACROMIOCLAVICULAR JOINTS COMPARISON:  None. FINDINGS: No fracture or malalignment is seen. No significant widening of AC joints with weights. Mild AC joint degenerative changes. There are mild glenohumeral inferior degenerative changes. Borderline narrowing of the right subacromial space. Lung apices are clear IMPRESSION: 1. No acute osseous abnormality 2. Mild  AC joint degenerative change 3. Mild glenohumeral degenerative change 4. Borderline narrowed appearance of right sub acromial space, could indicate rotator cuff pathology Electronically Signed   By: Donavan Foil M.D.   On: 07/20/2017 17:10      Assessment & Plan:   Douglas Russell is a 66 y.o. male Pain in joint of right shoulder - Plan: DG AC Joints Possible flare of mild osteoarthritis at [facility] joint. Overall reassuring x-ray. Does not have any subacromial symptoms on exam, and normal rotator cuff testing.  - Meloxicam 7.5 mg daily when necessary, off work today, then return in 3 days as long as symptoms are improved. RTC precautions if persistent meloxicam or persistent pain. Sooner if worse.   Meds ordered this encounter  Medications  . cephALEXin (KEFLEX) 500 MG capsule    Sig: Take 500 mg by mouth once as needed.  . meloxicam (MOBIC) 7.5 MG tablet    Sig: Take 1 tablet (7.5 mg total) by mouth daily.    Dispense:  15 tablet    Refill:  0   Patient Instructions   Overall your x-ray looked okay. Only minimal arthritis. Try meloxicam 1 pill once per day. Relative rest for the next few days, then as long as you are improving okay to return on Thursday. If pain continues on Thursday, follow-up with me Friday or Saturday morning and we can discuss further treatment. If you are improving, okay to continue taking meloxicam if needed up to 2 weeks. Follow-up  if refill needed.    IF you received an x-ray today, you will receive an invoice from [facility]. Please contact [facility] at [phone] with questions or concerns regarding your invoice.   IF you received labwork today, you will receive an invoice from [place]. Please contact [facility] at [phone] with questions or concerns regarding your invoice.   Our billing staff will not be able to assist you with questions regarding bills from these companies.  You will be contacted with the lab results as soon as they are available. The fastest way to get your results is to activate your My Chart account. Instructions are located on the last page of this paperwork. If you have not heard from [place] regarding the results in 2 weeks, please contact this office.       I personally performed the services described in this documentation, which was scribed in my presence. The recorded information has been reviewed and considered for accuracy and completeness, addended by me as needed, and agree with information above.  Signed,   Merri Ray, MD Primary Care at [place].  07/20/17 5:44 PM

## 2017-08-31 DIAGNOSIS — Z23 Encounter for immunization: Secondary | ICD-10-CM | POA: Diagnosis not present

## 2017-09-01 ENCOUNTER — Encounter: Payer: Self-pay | Admitting: Family Medicine

## 2017-09-29 DIAGNOSIS — H401132 Primary open-angle glaucoma, bilateral, moderate stage: Secondary | ICD-10-CM | POA: Diagnosis not present

## 2017-09-29 DIAGNOSIS — H2513 Age-related nuclear cataract, bilateral: Secondary | ICD-10-CM | POA: Diagnosis not present

## 2017-12-11 DIAGNOSIS — M25552 Pain in left hip: Secondary | ICD-10-CM | POA: Diagnosis not present

## 2017-12-11 DIAGNOSIS — M25551 Pain in right hip: Secondary | ICD-10-CM | POA: Diagnosis not present

## 2017-12-11 DIAGNOSIS — S335XXA Sprain of ligaments of lumbar spine, initial encounter: Secondary | ICD-10-CM | POA: Diagnosis not present

## 2017-12-11 DIAGNOSIS — M25561 Pain in right knee: Secondary | ICD-10-CM | POA: Diagnosis not present

## 2018-04-14 ENCOUNTER — Other Ambulatory Visit: Payer: Self-pay | Admitting: Family Medicine

## 2018-04-14 DIAGNOSIS — J4520 Mild intermittent asthma, uncomplicated: Secondary | ICD-10-CM

## 2018-04-24 ENCOUNTER — Encounter: Payer: Self-pay | Admitting: Physician Assistant

## 2018-04-24 ENCOUNTER — Ambulatory Visit (INDEPENDENT_AMBULATORY_CARE_PROVIDER_SITE_OTHER): Payer: Medicare HMO | Admitting: Physician Assistant

## 2018-04-24 ENCOUNTER — Other Ambulatory Visit: Payer: Self-pay

## 2018-04-24 VITALS — BP 134/80 | HR 70 | Temp 97.6°F | Resp 18 | Ht 72.13 in | Wt 177.4 lb

## 2018-04-24 DIAGNOSIS — G4700 Insomnia, unspecified: Secondary | ICD-10-CM

## 2018-04-24 DIAGNOSIS — J4520 Mild intermittent asthma, uncomplicated: Secondary | ICD-10-CM | POA: Diagnosis not present

## 2018-04-24 MED ORDER — FLUTICASONE PROPIONATE HFA 44 MCG/ACT IN AERO: 2.0000 | INHALATION_SPRAY | Freq: Two times a day (BID) | RESPIRATORY_TRACT | 5 refills | Status: DC

## 2018-04-24 MED ORDER — CETIRIZINE HCL 10 MG PO TABS: 10.0000 mg | ORAL_TABLET | Freq: Every day | ORAL | 11 refills | Status: DC

## 2018-04-24 MED ORDER — ALBUTEROL SULFATE HFA 108 (90 BASE) MCG/ACT IN AERS: 2.0000 | INHALATION_SPRAY | Freq: Four times a day (QID) | RESPIRATORY_TRACT | 0 refills | Status: DC | PRN

## 2018-04-24 MED ORDER — FLUTICASONE PROPIONATE HFA 44 MCG/ACT IN AERO: 2.0000 | INHALATION_SPRAY | Freq: Two times a day (BID) | RESPIRATORY_TRACT | 3 refills | Status: DC

## 2018-04-24 NOTE — Patient Instructions (Addendum)
Please take the flovent as prescribed.  Then take the albuterol as needed.   If you develop the hives again, please return.      IF you received an x-ray today, you will receive an invoice from [facility]. Please contact [facility] at [phone] with questions or concerns regarding your invoice.   IF you received labwork today, you will receive an invoice from [place]. Please contact [facility] at [phone] with questions or concerns regarding your invoice.   Our billing staff will not be able to assist you with questions regarding bills from these companies.  You will be contacted with the lab results as soon as they are available. The fastest way to get your results is to activate your My Chart account. Instructions are located on the last page of this paperwork. If you have not heard from [place] regarding the results in 2 weeks, please contact this office.

## 2018-04-24 NOTE — Progress Notes (Signed)
PRIMARY CARE AT [facility] [address] [place] [address] [phone]  Date:  04/24/2018   Name:  Douglas Russell   DOB:  1951-03-26   MRN:  [id number]  PCP:  Wendie Agreste, MD    History of Present Illness:  Douglas Russell is a 67 y.o. male patient who presents to PCP with  Chief Complaint  Patient presents with  . Medication Refill    albuterol Sulfate     Patient is here for refill of she is inhalers at this time.  He reports that he has some difficulty with his breathing at night.  The albuterol with 1 puff will help this.  He uses Tempur-Pedic pillows.  He apparently is not using the Flovent at this time. He reports difficulty with breathing with lying supine.  He has no chest pains, palpitations, or shortness of breath during the day. He reports difficulty with sleeping.  He is not gasping for breath.  He has had chronic insomnia.  This was treated with multiple pharmacotherapies including Ambien, trazodone, melatonin.  He has had sleep studies where he is not getting into a deep sleep pattern.  He would like to be reevaluated for this.  Last consult with neurology was several years ago.  Patient Active Problem List   Diagnosis Date Noted  . Hx of adenomatous polyp of colon 01/25/2015  . Status post left hip replacement 10/13/2014  . Asthma, chronic 10/13/2014  . Osteoarthritis of left hip 10/10/2014  . Hip arthritis 10/10/2014  . History of asthma 08/08/2014  . History of umbilical hernia 32/67/1245  . History of pelvic fracture 08/08/2014    Past Medical History:  Diagnosis Date  . Arthritis    L&R hip & pelvis   . Asthma    has had since he was a child, states he only uses the inhaler PRN  . Cataract   . Glaucoma   . Glaucoma    both eyes  . History of blood transfusion    /w pelvic fx.   Marland Kitchen Hx of adenomatous polyp of colon 01/25/2015  . Osteoarthritis of left hip 10/10/2014  . Pneumonia    in hosp.,[facility] ( /w pelvic fx)-     Past Surgical History:   Procedure Laterality Date  . back cyst  2006   surgery x4 for cyst on the back   . EYE SURGERY Bilateral March 2016   Dr. Marylynn Pearson, [place]  . FRACTURE SURGERY Right 1995   hip  . HERNIA REPAIR  8099   umbilical   . PELVIC FRACTURE SURGERY  1995  . TOTAL HIP ARTHROPLASTY Left 10/10/2014   dr Mardelle Matte  . TOTAL HIP ARTHROPLASTY Left 10/10/2014   Procedure: TOTAL HIP ARTHROPLASTY;  Surgeon: Johnny Bridge, MD;  Location: [place];  Service: Orthopedics;  Laterality: Left;    Social History   Tobacco Use  . Smoking status: Never Smoker  . Smokeless tobacco: Never Used  Substance Use Topics  . Alcohol use: Yes    Alcohol/week: 0.0 oz    Comment: occasional glass of wine  . Drug use: No    Family History  Problem Relation Age of Onset  . Hypertension Mother   . Diabetes Father   . Hypertension Sister   . Cancer Maternal Grandmother   . Hypertension Sister   . Colon cancer Neg Hx     Allergies  Allergen Reactions  . Shellfish Allergy Anaphylaxis and Swelling    Throat swells closed," like there  is a block of cement in my throat"   . Amoxicillin     Hives ? From amoxicillin vs. Chemicals he was in contact with that shift  . Trazodone And Nefazodone Anxiety    Medication list has been reviewed and updated.  Current Outpatient Medications on File Prior to Visit  Medication Sig Dispense Refill  . albuterol (PROVENTIL HFA;VENTOLIN HFA) 108 (90 Base) MCG/ACT inhaler Inhale 2 puffs into the lungs every 6 (six) hours as needed for wheezing or shortness of breath. Ok to fill with preferred pro air 1 Inhaler 0  . Brinzolamide-Brimonidine ([place] OP) Apply to eye.    . cephALEXin (KEFLEX) 500 MG capsule Take 500 mg by mouth once as needed.    Marland Kitchen CINNAMON PO Take 1 capsule by mouth daily.    . fish oil-omega-3 fatty acids 1000 MG capsule Take 1 g by mouth daily.    . fluticasone (FLOVENT HFA) 44 MCG/ACT inhaler Inhale 2 puffs into the lungs 2 (two) times daily. 1 Inhaler 3  .  Garlic 1245 MG CAPS Take by mouth.    . L-ARGININE PO Take 2 tablets by mouth 2 (two) times daily. Reported on 01/30/2016    . Melatonin 10 MG TABS Take 10 mg by mouth at bedtime.    . Multiple Vitamin (MULITIVITAMIN WITH MINERALS) TABS Take 1 tablet by mouth daily.    . tamsulosin (FLOMAX) 0.4 MG CAPS capsule Take 1 capsule (0.4 mg total) by mouth daily. 30 capsule 3  . Travoprost, BAK Free, (TRAVATAN) 0.004 % SOLN ophthalmic solution Place 1 drop into both eyes at bedtime. Reported on 01/30/2016    . Turmeric Curcumin 500 MG CAPS Take 500 mg by mouth daily.    Marland Kitchen aspirin EC 81 MG tablet Take 81 mg by mouth daily.    . meloxicam (MOBIC) 7.5 MG tablet Take 1 tablet (7.5 mg total) by mouth daily. (Patient not taking: Reported on 04/24/2018) 15 tablet 0   No current facility-administered medications on file prior to visit.     ROS ROS otherwise unremarkable unless listed above.  Physical Examination: BP 134/80 (BP Location: Right Arm, Patient Position: Sitting, Cuff Size: Normal)   Pulse 70   Temp 97.6 F (36.4 C) (Oral)   Resp 18   Ht 6' 0.13" (1.832 m)   Wt 177 lb 6.4 oz (80.5 kg)   SpO2 96%   BMI 23.98 kg/m  Ideal Body Weight: Weight in (lb) to have BMI = 25: 184.6  Physical Exam  Constitutional: He is oriented to person, place, and time. He appears well-developed and well-nourished. No distress.  HENT:  Head: Normocephalic and atraumatic.  Eyes: Pupils are equal, round, and reactive to light. Conjunctivae and EOM are normal.  Cardiovascular: Normal rate and regular rhythm.  No murmur heard. Pulmonary/Chest: Effort normal. No respiratory distress.  Neurological: He is alert and oriented to person, place, and time.  Skin: Skin is warm and dry. He is not diaphoretic.  Psychiatric: He has a normal mood and affect. His behavior is normal.     Assessment and Plan: Douglas Russell is a 67 y.o. male who is here today for cc of  Chief Complaint  Patient presents with  . Medication  Refill    albuterol Sulfate   Insomnia, unspecified type - Plan: Ambulatory referral to Neurology  Mild intermittent asthma without complication - Plan: albuterol (PROVENTIL HFA;VENTOLIN HFA) 108 (90 Base) MCG/ACT inhaler, fluticasone (FLOVENT HFA) 44 MCG/ACT inhaler, DISCONTINUED: fluticasone (FLOVENT HFA) 44 MCG/ACT  inhaler  Ivar Drape, PA-C [facility] 4/30/20199:59 AM

## 2018-04-27 DIAGNOSIS — H2513 Age-related nuclear cataract, bilateral: Secondary | ICD-10-CM | POA: Diagnosis not present

## 2018-04-27 DIAGNOSIS — H401132 Primary open-angle glaucoma, bilateral, moderate stage: Secondary | ICD-10-CM | POA: Diagnosis not present

## 2018-06-03 DIAGNOSIS — R351 Nocturia: Secondary | ICD-10-CM | POA: Diagnosis not present

## 2018-07-27 DIAGNOSIS — H2513 Age-related nuclear cataract, bilateral: Secondary | ICD-10-CM | POA: Diagnosis not present

## 2018-07-27 DIAGNOSIS — H401132 Primary open-angle glaucoma, bilateral, moderate stage: Secondary | ICD-10-CM | POA: Diagnosis not present

## 2018-09-03 DIAGNOSIS — N401 Enlarged prostate with lower urinary tract symptoms: Secondary | ICD-10-CM | POA: Diagnosis not present

## 2018-09-03 DIAGNOSIS — R351 Nocturia: Secondary | ICD-10-CM | POA: Diagnosis not present

## 2018-10-15 DIAGNOSIS — M25551 Pain in right hip: Secondary | ICD-10-CM | POA: Diagnosis not present

## 2018-11-01 ENCOUNTER — Ambulatory Visit (INDEPENDENT_AMBULATORY_CARE_PROVIDER_SITE_OTHER): Payer: Medicare HMO | Admitting: Physician Assistant

## 2018-11-01 ENCOUNTER — Ambulatory Visit: Payer: Medicare HMO

## 2018-11-01 DIAGNOSIS — J4520 Mild intermittent asthma, uncomplicated: Secondary | ICD-10-CM

## 2018-11-01 DIAGNOSIS — Z23 Encounter for immunization: Secondary | ICD-10-CM

## 2018-11-01 MED ORDER — FLUTICASONE PROPIONATE HFA 44 MCG/ACT IN AERO: 2.0000 | INHALATION_SPRAY | Freq: Two times a day (BID) | RESPIRATORY_TRACT | 5 refills | Status: DC

## 2018-11-01 MED ORDER — FLUTICASONE PROPIONATE HFA 44 MCG/ACT IN AERO: 2.0000 | INHALATION_SPRAY | Freq: Two times a day (BID) | RESPIRATORY_TRACT | 0 refills | Status: DC

## 2018-11-01 NOTE — Patient Instructions (Signed)
Pt here for Pneumonia 23 injection only.

## 2018-12-24 ENCOUNTER — Telehealth: Payer: Self-pay | Admitting: Family Medicine

## 2018-12-24 NOTE — Telephone Encounter (Signed)
Copied from [place] [phone]. Topic: Quick Communication - See Telephone Encounter >> Dec 24, 2018  4:01 PM [name], NT wrote: CRM for notification. See Telephone encounter for: 12/24/18. Patient calling and states that he does have an appointment with Dr Carlota Raspberry for med refills, but is needing the medications sooner than his appointment on 01/07/19. Spoke to [place] and was advised to send a message and have Dr Carlota Raspberry to advise on refill before appointment.   Medication: fluticasone (FLOVENT HFA) 44 MCG/ACT inhaler albuterol (PROVENTIL HFA;VENTOLIN HFA) 108 (90 Base) MCG/ACT inhaler  Pharmacy: [place], [place]

## 2018-12-27 ENCOUNTER — Other Ambulatory Visit: Payer: Self-pay | Admitting: *Deleted

## 2018-12-27 DIAGNOSIS — J4520 Mild intermittent asthma, uncomplicated: Secondary | ICD-10-CM

## 2018-12-27 MED ORDER — ALBUTEROL SULFATE HFA 108 (90 BASE) MCG/ACT IN AERS: 2.0000 | INHALATION_SPRAY | Freq: Four times a day (QID) | RESPIRATORY_TRACT | 0 refills | Status: DC | PRN

## 2019-01-07 ENCOUNTER — Encounter: Payer: Self-pay | Admitting: Family Medicine

## 2019-01-07 ENCOUNTER — Encounter

## 2019-01-07 ENCOUNTER — Ambulatory Visit (INDEPENDENT_AMBULATORY_CARE_PROVIDER_SITE_OTHER): Payer: Medicare HMO | Admitting: Family Medicine

## 2019-01-07 ENCOUNTER — Other Ambulatory Visit: Payer: Self-pay

## 2019-01-07 DIAGNOSIS — J4520 Mild intermittent asthma, uncomplicated: Secondary | ICD-10-CM | POA: Diagnosis not present

## 2019-01-07 MED ORDER — ALBUTEROL SULFATE HFA 108 (90 BASE) MCG/ACT IN AERS: 2.0000 | INHALATION_SPRAY | Freq: Four times a day (QID) | RESPIRATORY_TRACT | 0 refills | Status: DC | PRN

## 2019-01-07 MED ORDER — FLUTICASONE PROPIONATE HFA 44 MCG/ACT IN AERO: 2.0000 | INHALATION_SPRAY | Freq: Two times a day (BID) | RESPIRATORY_TRACT | 2 refills | Status: DC

## 2019-01-07 NOTE — Progress Notes (Signed)
Subjective:    Patient ID: Douglas Russell, male    DOB: Feb 10, 1951, 68 y.o.   MRN: [id number] Chief Complaint  Patient presents with  . Medication Refill    inhealers    Medication Refill   Douglas Russell is a 68 y.o. male who presents to Primary Care at [facility] for medication management of his asthma  Asthma History of mild intermittent asthma. His symptoms are historically increased while lying down. Last appointment in clinic was on 04/24/18 with [place]. At which point he was still having issues breathing at night. Was not utilizing Flovent. He has since started utilizing Flovent (2 puffs qAM PM) and reports that on Flovent he uses his rescue inhaler infrequently (1-2x/month). Has been out of Flovent for the past month. Currently taking Albuterol in place of Flovent.   Denies recent fevers and chest pains.  Past Medical History:  Diagnosis Date  . Arthritis    L&R hip & pelvis   . Asthma    has had since he was a child, states he only uses the inhaler PRN  . Cataract   . Glaucoma   . Glaucoma    both eyes  . History of blood transfusion    /w pelvic fx.   Marland Kitchen Hx of adenomatous polyp of colon 01/25/2015  . Osteoarthritis of left hip 10/10/2014  . Pneumonia    in hosp.,[facility] ( /w pelvic fx)-    Past Surgical History:  Procedure Laterality Date  . back cyst  2006   surgery x4 for cyst on the back   . EYE SURGERY Bilateral March 2016   Dr. Marylynn Pearson, [facility]  . FRACTURE SURGERY Right 1995   hip  . HERNIA REPAIR  [id number]   umbilical   . PELVIC FRACTURE SURGERY  1995  . TOTAL HIP ARTHROPLASTY Left 10/10/2014   dr Mardelle Matte  . TOTAL HIP ARTHROPLASTY Left 10/10/2014   Procedure: TOTAL HIP ARTHROPLASTY;  Surgeon: Johnny Bridge, MD;  Location: [facility];  Service: Orthopedics;  Laterality: Left;   Allergies  Allergen Reactions  . Shellfish Allergy Anaphylaxis and Swelling    Throat swells closed," like there is a block of cement in my throat"   . Amoxicillin    Hives ? From amoxicillin vs. Chemicals he was in contact with that shift  . Trazodone And Nefazodone Anxiety   Prior to Admission medications   Medication Sig Start Date End Date Taking? Authorizing Provider  albuterol (PROVENTIL HFA;VENTOLIN HFA) 108 (90 Base) MCG/ACT inhaler Inhale 2 puffs into the lungs every 6 (six) hours as needed for wheezing or shortness of breath. Ok to fill with preferred pro air 01/07/19  Yes Wendie Agreste, MD  Brinzolamide-Brimonidine [facility] OP) Apply to eye.   Yes [provider]  cephALEXin (KEFLEX) 500 MG capsule Take 500 mg by mouth once as needed.   Yes [provider]  cetirizine (ZYRTEC) 10 MG tablet Take 1 tablet (10 mg total) by mouth daily. 04/24/18  Yes English, Colletta [place] D, PA  CINNAMON PO Take 1 capsule by mouth daily.   Yes [provider]  fish oil-omega-3 fatty acids 1000 MG capsule Take 1 g by mouth daily.   Yes [provider]  fluticasone (FLOVENT HFA) 44 MCG/ACT inhaler Inhale 2 puffs into the lungs 2 (two) times daily. 01/07/19  Yes Wendie Agreste, MD  Garlic 7564 MG CAPS Take by mouth.   Yes [provider]  L-ARGININE PO Take 2 tablets by mouth  2 (two) times daily. Reported on 01/30/2016   Yes [provider]  Melatonin 10 MG TABS Take 10 mg by mouth at bedtime.   Yes [provider]  Multiple Vitamin (MULITIVITAMIN WITH MINERALS) TABS Take 1 tablet by mouth daily.   Yes [provider]  tamsulosin (FLOMAX) 0.4 MG CAPS capsule Take 1 capsule (0.4 mg total) by mouth daily. 03/28/17  Yes Wendie Agreste, MD  Travoprost, BAK Free, (TRAVATAN) 0.004 % SOLN ophthalmic solution Place 1 drop into both eyes at bedtime. Reported on 01/30/2016   Yes [provider]  Turmeric Curcumin 500 MG CAPS Take 500 mg by mouth daily.   Yes [provider]   Social History   Socioeconomic History  . Marital status: Divorced    Spouse name: Not on file  . Number of  children: 1  . Years of education: Not on file  . Highest education level: Not on file  Occupational History  . Not on file  Social Needs  . Financial resource strain: Not on file  . Food insecurity:    Worry: Not on file    Inability: Not on file  . Transportation needs:    Medical: Not on file    Non-medical: Not on file  Tobacco Use  . Smoking status: Never Smoker  . Smokeless tobacco: Never Used  Substance and Sexual Activity  . Alcohol use: Yes    Alcohol/week: 0.0 standard drinks    Comment: occasional glass of wine  . Drug use: No  . Sexual activity: Yes  Lifestyle  . Physical activity:    Days per week: Not on file    Minutes per session: Not on file  . Stress: Not on file  Relationships  . Social connections:    Talks on phone: Not on file    Gets together: Not on file    Attends religious service: Not on file    Active member of club or organization: Not on file    Attends meetings of clubs or organizations: Not on file    Relationship status: Not on file  . Intimate partner violence:    Fear of current or ex partner: Not on file    Emotionally abused: Not on file    Physically abused: Not on file    Forced sexual activity: Not on file  Other Topics Concern  . Not on file  Social History Narrative  . Not on file     Review of Systems See HPI    Objective:   Physical Exam Constitutional:      Appearance: Normal appearance.  HENT:     Head: Normocephalic.     Right Ear: Tympanic membrane normal.     Left Ear: Tympanic membrane normal.     Nose: Nose normal.     Mouth/Throat:     Mouth: Mucous membranes are moist.     Pharynx: Oropharynx is clear.  Cardiovascular:     Rate and Rhythm: Normal rate and regular rhythm.  Pulmonary:     Effort: Pulmonary effort is normal. No respiratory distress.     Breath sounds: Normal breath sounds. No wheezing.  Skin:    General: Skin is dry.     Findings: No erythema, lesion or rash.  Neurological:      Mental Status: He is alert and oriented to person, place, and time.  Psychiatric:        Mood and Affect: Mood normal.  Behavior: Behavior normal.    Vitals:   01/07/19 0858 01/07/19 0901  BP: (!) 152/78 124/70  Pulse: 74   Temp: 98.5 F (36.9 C)   TempSrc: Oral   SpO2: 97%   Weight: 186 lb 12.8 oz (84.7 kg)   Height: 6' (1.829 m)       Assessment & Plan:   Douglas Russell is a 68 y.o. male Mild intermittent asthma without complication - Plan: albuterol (PROVENTIL HFA;VENTOLIN HFA) 108 (90 Base) MCG/ACT inhaler, fluticasone (FLOVENT HFA) 44 MCG/ACT inhaler  -Restart Flovent, albuterol as needed.  Empirically controlled on Flovent as daily medication.  RTC precautions.  Meds ordered this encounter  Medications  . albuterol (PROVENTIL HFA;VENTOLIN HFA) 108 (90 Base) MCG/ACT inhaler    Sig: Inhale 2 puffs into the lungs every 6 (six) hours as needed for wheezing or shortness of breath. Ok to fill with preferred pro air    Dispense:  1 Inhaler    Refill:  0  . fluticasone (FLOVENT HFA) 44 MCG/ACT inhaler    Sig: Inhale 2 puffs into the lungs 2 (two) times daily.    Dispense:  3 Inhaler    Refill:  2   Patient Instructions       If you have lab work done today you will be contacted with your lab results within the next 2 weeks.  If you have not heard from [place] then please contact us. The fastest way to get your results is to register for My Chart.   IF you received an x-ray today, you will receive an invoice from [facility]. Please contact [facility] at [phone] with questions or concerns regarding your invoice.   IF you received labwork today, you will receive an invoice from [place]. Please contact [facility] at [phone] with questions or concerns regarding your invoice.   Our billing staff will not be able to assist you with questions regarding bills from these companies.  You will be contacted with the lab results as soon as they are  available. The fastest way to get your results is to activate your My Chart account. Instructions are located on the last page of this paperwork. If you have not heard from [place] regarding the results in 2 weeks, please contact this office.       I personally performed the services described in this documentation, which was scribed in my presence. The recorded information has been reviewed and considered for accuracy and completeness, addended by me as needed, and agree with information above.  Signed,   Merri Ray, MD Primary Care at [place].  01/09/19 9:35 PM      I, Stephania Fragmin , am acting as a scribe for Wendie Agreste, MD

## 2019-01-07 NOTE — Patient Instructions (Signed)
° ° ° °  If you have lab work done today you will be contacted with your lab results within the next 2 weeks.  If you have not heard from us then please contact us. The fastest way to get your results is to register for My Chart. ° ° °IF you received an x-ray today, you will receive an invoice from [facility]. Please contact [facility] at [phone] with questions or concerns regarding your invoice.  ° °IF you received labwork today, you will receive an invoice from [facility]. Please contact [facility] at [phone] with questions or concerns regarding your invoice.  ° °Our billing staff will not be able to assist you with questions regarding bills from these companies. ° °You will be contacted with the lab results as soon as they are available. The fastest way to get your results is to activate your My Chart account. Instructions are located on the last page of this paperwork. If you have not heard from us regarding the results in 2 weeks, please contact this office. °  ° ° ° °

## 2019-01-13 DIAGNOSIS — N401 Enlarged prostate with lower urinary tract symptoms: Secondary | ICD-10-CM | POA: Diagnosis not present

## 2019-01-13 DIAGNOSIS — R351 Nocturia: Secondary | ICD-10-CM | POA: Diagnosis not present

## 2019-03-01 ENCOUNTER — Ambulatory Visit: Payer: Self-pay

## 2019-03-01 NOTE — Telephone Encounter (Signed)
Pt c/o intermittent burning pain to the right pubic area by the right hip joint. Pain started last Tuesday. Pt stated he was brushing his teeth and he gagged then the pain was extreme, lasted for 30 to 40 minutes then went away. Pt stated he will have 2-5 hours of relief from pain.  Denies other symptoms. Pt was not having pain at the time of the call. Pt has been using Meloxicam prn (was prescribed for orthopedic issue) Pt stated that he did get some pain relief. Care advice given to pt and pt verbalized understanding. Pt given apt tomorrow with PCP.  Reason for Disposition . [1] MODERATE pain (e.g., interferes with normal activities) AND [2] pain comes and goes (cramps) AND [3] present > 24 hours  (Exception: pain with Vomiting or Diarrhea - see that Guideline)  Answer Assessment - Initial Assessment Questions 1. LOCATION: "Where does it hurt?"      Right part of pubic area by right hip joint 2. RADIATION: "Does the pain shoot anywhere else?" (e.g., chest, back)     no 3. ONSET: "When did the pain begin?" (Minutes, hours or days ago)      Tuesday 4. SUDDEN: "Gradual or sudden onset?"     sudden 5. PATTERN "Does the pain come and go, or is it constant?"    - If constant: "Is it getting better, staying the same, or worsening?"      (Note: Constant means the pain never goes away completely; most serious pain is constant and it progresses)     - If intermittent: "How long does it last?" "Do you have pain now?"     (Note: Intermittent means the pain goes away completely between bouts)     Comes and goes- worsening 30-40 minutes 6. SEVERITY: "How bad is the pain?"  (e.g., Scale 1-10; mild, moderate, or severe)    - MILD (1-3): doesn't interfere with normal activities, abdomen soft and not tender to touch     - MODERATE (4-7): interferes with normal activities or awakens from sleep, tender to touch     - SEVERE (8-10): excruciating pain, doubled over, unable to do any normal activities       No  pain at the time of call- can go to 10/10 7. RECURRENT SYMPTOM: "Have you ever had this type of abdominal pain before?" If so, ask: "When was the last time?" and "What happened that time?"      no 8. CAUSE: "What do you think is causing the abdominal pain?"     Pt worried appendix 9. RELIEVING/AGGRAVATING FACTORS: "What makes it better or worse?" (e.g., movement, antacids, bowel movement)     Laying down makes it feel better- standing and certain movements, sitting too long 10. OTHER SYMPTOMS: "Has there been any vomiting, diarrhea, constipation, or urine problems?"       no  Protocols used: ABDOMINAL PAIN - MALE-A-AH

## 2019-03-01 NOTE — Telephone Encounter (Signed)
PEC - NT, Arbie Cookey Finbar Nippert, will call pt to triage.  Please see Nurse Triage Telephone Encounter for documentation.  ----- Message -----  From: Buckner Malta  Sent: 03/01/2019  8:49 AM EST  To: Madelyn Brunner Pool  Subject: Appointment Request                 Appointment Request From: Buckner Malta    With Provider: Wendie Agreste, MD [[facility] at [place]    Preferred Date Range: 03/01/2019 - 03/02/2019    Preferred Times: Any time    Reason for visit: Request an Appointment    Comments:  i have a burning stabbing pain by my right hip joint; right groin area; want to evaluate if it is my appendix

## 2019-03-02 ENCOUNTER — Other Ambulatory Visit: Payer: Self-pay

## 2019-03-02 ENCOUNTER — Encounter: Payer: Self-pay | Admitting: Family Medicine

## 2019-03-02 ENCOUNTER — Ambulatory Visit (INDEPENDENT_AMBULATORY_CARE_PROVIDER_SITE_OTHER): Payer: Medicare HMO

## 2019-03-02 ENCOUNTER — Emergency Department (HOSPITAL_COMMUNITY)
Admission: EM | Admit: 2019-03-02 | Discharge: 2019-03-02 | Disposition: A | Discharge status: Home / Self Care | Payer: Medicare HMO | Attending: Emergency Medicine | Admitting: Emergency Medicine

## 2019-03-02 ENCOUNTER — Ambulatory Visit (INDEPENDENT_AMBULATORY_CARE_PROVIDER_SITE_OTHER): Payer: Medicare HMO | Admitting: Family Medicine

## 2019-03-02 VITALS — BP 140/78 | HR 79 | Temp 97.8°F | Resp 16 | Ht 72.0 in | Wt 185.4 lb

## 2019-03-02 DIAGNOSIS — J45909 Unspecified asthma, uncomplicated: Secondary | ICD-10-CM | POA: Insufficient documentation

## 2019-03-02 DIAGNOSIS — R1030 Lower abdominal pain, unspecified: Secondary | ICD-10-CM | POA: Diagnosis not present

## 2019-03-02 DIAGNOSIS — Z96642 Presence of left artificial hip joint: Secondary | ICD-10-CM | POA: Diagnosis not present

## 2019-03-02 DIAGNOSIS — Z79899 Other long term (current) drug therapy: Secondary | ICD-10-CM | POA: Diagnosis not present

## 2019-03-02 DIAGNOSIS — M25551 Pain in right hip: Secondary | ICD-10-CM

## 2019-03-02 DIAGNOSIS — R109 Unspecified abdominal pain: Secondary | ICD-10-CM | POA: Diagnosis not present

## 2019-03-02 DIAGNOSIS — K4090 Unilateral inguinal hernia, without obstruction or gangrene, not specified as recurrent: Secondary | ICD-10-CM | POA: Insufficient documentation

## 2019-03-02 DIAGNOSIS — R1031 Right lower quadrant pain: Secondary | ICD-10-CM

## 2019-03-02 LAB — CBC WITH DIFFERENTIAL/PLATELET
Abs Immature Granulocytes: 0.01 10*3/uL (ref 0.00–0.07)
BASOS ABS: 0 10*3/uL (ref 0.0–0.1)
Basophils Relative: 1 %
Eosinophils Absolute: 0.1 10*3/uL (ref 0.0–0.5)
Eosinophils Relative: 2 %
HCT: 48.5 % (ref 39.0–52.0)
Hemoglobin: 15.4 g/dL (ref 13.0–17.0)
Immature Granulocytes: 0 %
LYMPHS ABS: 1.6 10*3/uL (ref 0.7–4.0)
Lymphocytes Relative: 38 %
MCH: 29.3 pg (ref 26.0–34.0)
MCHC: 31.8 g/dL (ref 30.0–36.0)
MCV: 92.4 fL (ref 80.0–100.0)
Monocytes Absolute: 0.4 10*3/uL (ref 0.1–1.0)
Monocytes Relative: 10 %
Neutro Abs: 2 10*3/uL (ref 1.7–7.7)
Neutrophils Relative %: 49 %
Platelets: 159 10*3/uL (ref 150–400)
RBC: 5.25 MIL/uL (ref 4.22–5.81)
RDW: 14.6 % (ref 11.5–15.5)
WBC: 4.2 10*3/uL (ref 4.0–10.5)
nRBC: 0 % (ref 0.0–0.2)

## 2019-03-02 LAB — BASIC METABOLIC PANEL
Anion gap: 8 (ref 5–15)
BUN: 19 mg/dL (ref 8–23)
CO2: 25 mmol/L (ref 22–32)
Calcium: 9.1 mg/dL (ref 8.9–10.3)
Chloride: 106 mmol/L (ref 98–111)
Creatinine, Ser: 0.9 mg/dL (ref 0.61–1.24)
GFR calc Af Amer: >60 mL/min (ref >60)
GFR calc non Af Amer: >60 mL/min (ref >60)
Glucose, Bld: 83 mg/dL (ref 70–99)
Potassium: 4.7 mmol/L (ref 3.5–5.1)
Sodium: 139 mmol/L (ref 135–145)

## 2019-03-02 LAB — POCT URINALYSIS DIP (MANUAL ENTRY)
Bilirubin, UA: NEGATIVE
Blood, UA: NEGATIVE
Glucose, UA: NEGATIVE mg/dL
Ketones, POC UA: NEGATIVE mg/dL
Leukocytes, UA: NEGATIVE
Nitrite, UA: NEGATIVE
Protein Ur, POC: NEGATIVE mg/dL
Spec Grav, UA: >=1.03 — AB (ref 1.010–1.025)
Urobilinogen, UA: 0.2 E.U./dL
pH, UA: 5.5 (ref 5.0–8.0)

## 2019-03-02 MED ORDER — HYDROMORPHONE HCL 1 MG/ML IJ SOLN
1.0000 mg | Freq: Once | INTRAMUSCULAR | Status: AC
  Administered 2019-03-02: 1 mg via INTRAVENOUS
  Filled 2019-03-02: qty 1

## 2019-03-02 MED ORDER — ACETAMINOPHEN ER 650 MG PO TBCR: 650.0000 mg | EXTENDED_RELEASE_TABLET | Freq: Three times a day (TID) | ORAL | 0 refills | Status: DC | PRN

## 2019-03-02 NOTE — Patient Instructions (Addendum)
Unfortunately I do think you have a right sided hernia.  Although on initial exam I thought I was able to partially reduce it, on repeat exam I am not able to reduce that hernia and I am concerned about a possible incarcerated hernia.  For that reason I do think you should be evaluated in the emergency room tonight. Please go there after leaving our office: [facility]  [place]  [phone]   Hernia, Adult     A hernia is the bulging of an organ or tissue through a weak spot in the muscles of the abdomen (abdominal wall). Hernias develop most often near the belly button (navel) or the area where the leg meets the lower abdomen (groin). Common types of hernias include:  Incisional hernia. This type bulges through a scar from an abdominal surgery.  Umbilical hernia. This type develops near the navel.  Inguinal hernia. This type develops in the groin or scrotum.  Femoral hernia. This type develops under the groin, in the upper thigh area.  Hiatal hernia. This type occurs when part of the stomach slides above the muscle that separates the abdomen from the chest (diaphragm). What are the causes? This condition may be caused by:  Heavy lifting.  Coughing over a long period of time.  Straining to have a bowel movement. Constipation can lead to straining.  An incision made during an abdominal surgery.  A physical problem that is present at birth (congenital defect).  Being overweight or obese.  Smoking.  Excess fluid in the abdomen.  Undescended testicles in males. What are the signs or symptoms? The main symptom is a skin-colored, rounded bulge in the area of the hernia. However, a bulge may not always be present. It may grow bigger or be more visible when you cough or strain (such as when lifting something heavy). A hernia that can be pushed back into the area (is reducible) rarely causes pain. A hernia that cannot be pushed back into the area  (is incarcerated) may lose its blood supply (become strangulated). A hernia that is incarcerated may cause:  Pain.  Fever.  Nausea and vomiting.  Swelling.  Constipation. How is this diagnosed? A hernia may be diagnosed based on:  Your symptoms and medical history.  A physical exam. Your health care provider may ask you to cough or move in certain ways to see if the hernia becomes visible.  Imaging tests, such as: ? X-rays. ? Ultrasound. ? CT scan. How is this treated? A hernia that is small and painless may not need to be treated. A hernia that is large or painful may be treated with surgery. Inguinal hernias may be treated with surgery to prevent incarceration or strangulation. Strangulated hernias are always treated with surgery because a lack of blood supply to the trapped organ or tissue can cause it to die. Surgery to treat a hernia involves pushing the bulge back into place and repairing the weak area of the muscle or abdominal wall. Follow these instructions at home: Activity  Avoid straining.  Do not lift anything that is heavier than 10 lb (4.5 kg), or the limit that you are told, until your health care provider says that it is safe.  When lifting heavy objects, lift with your leg muscles, not your back muscles. Preventing constipation  Take actions to prevent constipation. Constipation leads to straining with bowel movements, which can make a hernia worse or cause a hernia repair to break down.  Your health care provider may recommend that you: ? Drink enough fluid to keep your urine pale yellow. ? Eat foods that are high in fiber, such as fresh fruits and vegetables, whole grains, and beans. ? Limit foods that are high in fat and processed sugars, such as fried or sweet foods. ? Take an over-the-counter or prescription medicine for constipation. General instructions  When coughing, try to cough gently.  You may try to push the hernia back in place by very gently  pressing on it while lying down. Do not try to force the bulge back in if it will not push in easily.  If you are overweight, work with your health care provider to lose weight safely.  Do not use any products that contain nicotine or tobacco, such as cigarettes and e-cigarettes. If you need help quitting, ask your health care provider.  If you are scheduled for hernia repair, watch your hernia for any changes in shape, size, or color. Tell your health care provider about any changes or new symptoms.  Take over-the-counter and prescription medicines only as told by your health care provider.  Keep all follow-up visits as told by your health care provider. This is important. Contact a health care provider if:  You develop new pain, swelling, or redness around your hernia.  You have signs of constipation, such as: ? Fewer bowel movements in a week than normal. ? Difficulty having a bowel movement. ? Stools that are dry, hard, or larger than normal. Get help right away if:  You have a fever.  You have abdomen pain that gets worse.  You feel nauseous or you vomit.  You cannot push the hernia back in place by very gently pressing on it while lying down. Do not try to force the bulge back in if it will not push in easily.  The hernia: ? Changes in shape, size, or color. ? Feels hard or tender. These symptoms may represent a serious problem that is an emergency. Do not wait to see if the symptoms will go away. Get medical help right away. Call your local emergency services (911 in the [place] Summary  A hernia is the bulging of an organ or tissue through a weak spot in the muscles of the abdomen (abdominal wall).  The main symptom is a skin-colored, rounded lump (bulge) in the hernia area. However, a bulge may not always be present. It may grow bigger or more visible when you cough or strain (such as when having a bowel movement).  A hernia that is small and painless may not need to be  treated. A hernia that is large or painful may be treated with surgery.  Surgery to treat a hernia involves pushing the bulge back into place and repairing the weak part of the abdomen. This information is not intended to replace advice given to you by your health care provider. Make sure you discuss any questions you have with your health care provider. Document Released: 12/15/2005 Document Revised: 09/16/2017 Document Reviewed: 09/16/2017 Elsevier Interactive Patient Education  [employer].   If you have lab work done today you will be contacted with your lab results within the next 2 weeks.  If you have not heard from [place] then please contact us. The fastest way to get your results is to register for My Chart.   IF you received an x-ray today, you will receive an invoice from [facility]. Please contact [facility] at [phone] with questions or concerns  concerns regarding your invoice.   IF you received labwork today, you will receive an invoice from [facility]. Please contact [facility] at [phone] with questions or concerns regarding your invoice.   Our billing staff will not be able to assist you with questions regarding bills from these companies.  You will be contacted with the lab results as soon as they are available. The fastest way to get your results is to activate your My Chart account. Instructions are located on the last page of this paperwork. If you have not heard from us regarding the results in 2 weeks, please contact this office.      

## 2019-03-02 NOTE — Discharge Instructions (Signed)
We saw in the ER for what appears to be a hernia.  Fortunately the hernia went down on its own and he did not need emergent surgery.  However the hernia will need to be repaired if it is causing you problems.  Please call Dr. Ninfa Linden at his office for appropriate follow-up.  Return to the ER immediately if you start having severe pain.

## 2019-03-02 NOTE — Progress Notes (Signed)
Patient ID: Douglas Russell, male   DOB: 02/27/1951, 68 y.o.   MRN: [id number]   Pt with apparent right inguinal hernia sent by primary physician.  By the time I arrived, the hernia had completely reduced after pain meds and EDP's attempt followed by trendelenburg. Pt now pain free and non-tender.  He had no obstructive symptoms. Ok to discharge and follow-up in my office. He agrees with the plan

## 2019-03-02 NOTE — ED Triage Notes (Signed)
Pt arriving with right inguinal hernia. Pt went to PCP who tried to reduce it with little relief. Pt rating pain 3/10 at this time. No urinary symptoms.

## 2019-03-02 NOTE — Progress Notes (Signed)
Subjective:    Patient ID: Douglas Russell, male    DOB: 02/17/1951, 68 y.o.   MRN: [id number]  HPI Douglas Russell is a 68 y.o. male Presents today for: Chief Complaint  Patient presents with  . Pelvic Pain    1 week ago. pain hit all of a sudden. Hx of pelvis fx back in 1995   Started at 2pm 8 days ago.  Coughing after brushing teeth - had some gagging, all of sudden felt pain in front of R groin. Hurt to move. Had to call out of work. Pain felt worse yesterday, less today. sitting to standing and lying helps - has to change position.  BM BID - normal, no blood in stool or urine.  Urinating normally - no burning. No fever/n/v.   Has hx of pelvic fracture in 1995 hardware initially in pelvis, and pin in the low back area of pelvis. , L hip replacement in October 2015 (no left hip pain). No abd pain. Does notice some swelling in front. Has noticed some swelling in buttock area on back r side since last week. Has some stiffness or swelling in backside at times with certain activities prior.   Pain worst at times today, and overnight. Worse after initial exam.   Tx: meloxicam for past 6 days.   Had been moving ok at work, twisting and turning some as usual, no new activities.    Patient Active Problem List   Diagnosis Date Noted  . Hx of adenomatous polyp of colon 01/25/2015  . Status post left hip replacement 10/13/2014  . Asthma, chronic 10/13/2014  . Osteoarthritis of left hip 10/10/2014  . Hip arthritis 10/10/2014  . History of asthma 08/08/2014  . History of umbilical hernia 46/50/3546  . History of pelvic fracture 08/08/2014   Past Medical History:  Diagnosis Date  . Arthritis    L&R hip & pelvis   . Asthma    has had since he was a child, states he only uses the inhaler PRN  . Cataract   . Glaucoma   . Glaucoma    both eyes  . History of blood transfusion    /w pelvic fx.   Marland Kitchen Hx of adenomatous polyp of colon 01/25/2015  . Osteoarthritis of left hip 10/10/2014    . Pneumonia    in hosp[facility] ( /w pelvic fx)-    Past Surgical History:  Procedure Laterality Date  . back cyst  2006   surgery x4 for cyst on the back   . EYE SURGERY Bilateral March 2016   Dr. Marylynn Pearson, [place]  . FRACTURE SURGERY Right 1995   hip  . HERNIA REPAIR  [id number]   umbilical   . PELVIC FRACTURE SURGERY  1995  . TOTAL HIP ARTHROPLASTY Left 10/10/2014   dr Mardelle Matte  . TOTAL HIP ARTHROPLASTY Left 10/10/2014   Procedure: TOTAL HIP ARTHROPLASTY;  Surgeon: Johnny Bridge, MD;  Location: [facility];  Service: Orthopedics;  Laterality: Left;   Allergies  Allergen Reactions  . Shellfish Allergy Anaphylaxis and Swelling    Throat swells closed," like there is a block of cement in my throat"   . Amoxicillin     Hives ? From amoxicillin vs. Chemicals he was in contact with that shift  . Trazodone And Nefazodone Anxiety   Prior to Admission medications   Medication Sig Start Date End Date Taking? Authorizing Provider  albuterol (PROVENTIL HFA;VENTOLIN HFA) 108 (90 Base) MCG/ACT inhaler Inhale 2 puffs into the  lungs every 6 (six) hours as needed for wheezing or shortness of breath. Ok to fill with preferred pro air 01/07/19  Yes Wendie Agreste, MD  bimatoprost (LUMIGAN) 0.01 % SOLN 1 drop at bedtime.   Yes [provider]  Brinzolamide-Brimonidine [facility] OP) Apply to eye.   Yes [provider]  cetirizine (ZYRTEC) 10 MG tablet Take 1 tablet (10 mg total) by mouth daily. 04/24/18  Yes English, Colletta Maryland D, PA  CINNAMON PO Take 1 capsule by mouth daily.   Yes [provider]  fish oil-omega-3 fatty acids 1000 MG capsule Take 1 g by mouth daily.   Yes [provider]  fluticasone (FLOVENT HFA) 44 MCG/ACT inhaler Inhale 2 puffs into the lungs 2 (two) times daily. 01/07/19  Yes Wendie Agreste, MD  Garlic 5102 MG CAPS Take by mouth.   Yes [provider]  L-ARGININE PO Take 2 tablets by mouth 2 (two) times daily. Reported on 01/30/2016   Yes  [provider]  Melatonin 10 MG TABS Take 10 mg by mouth at bedtime.   Yes [provider]  Multiple Vitamin (MULITIVITAMIN WITH MINERALS) TABS Take 1 tablet by mouth daily.   Yes [provider]  tamsulosin (FLOMAX) 0.4 MG CAPS capsule Take 1 capsule (0.4 mg total) by mouth daily. 03/28/17  Yes Wendie Agreste, MD  Turmeric Curcumin 500 MG CAPS Take 500 mg by mouth daily.   Yes [provider]   Social History   Socioeconomic History  . Marital status: Divorced    Spouse name: Not on file  . Number of children: 1  . Years of education: Not on file  . Highest education level: Not on file  Occupational History  . Not on file  Social Needs  . Financial resource strain: Not on file  . Food insecurity:    Worry: Not on file    Inability: Not on file  . Transportation needs:    Medical: Not on file    Non-medical: Not on file  Tobacco Use  . Smoking status: Never Smoker  . Smokeless tobacco: Never Used  Substance and Sexual Activity  . Alcohol use: Yes    Alcohol/week: 0.0 standard drinks    Comment: occasional glass of wine  . Drug use: No  . Sexual activity: Yes  Lifestyle  . Physical activity:    Days per week: Not on file    Minutes per session: Not on file  . Stress: Not on file  Relationships  . Social connections:    Talks on phone: Not on file    Gets together: Not on file    Attends religious service: Not on file    Active member of club or organization: Not on file    Attends meetings of clubs or organizations: Not on file    Relationship status: Not on file  . Intimate partner violence:    Fear of current or ex partner: Not on file    Emotionally abused: Not on file    Physically abused: Not on file    Forced sexual activity: Not on file  Other Topics Concern  . Not on file  Social History Narrative  . Not on file    Review of Systems     Objective:   Physical Exam Constitutional:      General: He is not in  acute distress.    Appearance: He is well-developed.  HENT:     Head: Normocephalic and atraumatic.  Cardiovascular:     Rate and Rhythm: Normal rate.  Pulmonary:     Effort: Pulmonary effort is normal.  Abdominal:     Hernia: A hernia is present. Hernia is present in the right inguinal area.  Genitourinary:   Musculoskeletal:     Right hip: He exhibits normal range of motion (pain free ROM of R hip. ), no tenderness, no bony tenderness and no swelling.       Back:  Neurological:     Mental Status: He is alert and oriented to person, place, and time.    Vitals:   03/02/19 1601  BP: 140/78  Pulse: 79  Resp: 16  Temp: 97.8 F (36.6 C)  TempSrc: Oral  SpO2: 99%  Weight: 185 lb 6.4 oz (84.1 kg)  Height: 6' (1.829 m)   Results for orders placed or performed in visit on 03/02/19  POCT urinalysis dipstick  Result Value Ref Range   Color, UA yellow yellow   Clarity, UA clear clear   Glucose, UA negative negative mg/dL   Bilirubin, UA negative negative   Ketones, POC UA negative negative mg/dL   Spec Grav, UA >=1.030 (A) 1.010 - 1.025   Blood, UA negative negative   pH, UA 5.5 5.0 - 8.0   Protein Ur, POC negative negative mg/dL   Urobilinogen, UA 0.2 0.2 or 1.0 E.U./dL   Nitrite, UA Negative Negative   Leukocytes, UA Negative Negative   Dg Pelvis 1-2 Views  Result Date: 03/02/2019 CLINICAL DATA:  History of prior pelvic fracture with surgery and left hip replacement. Anterior right pubic and groin pain. EXAM: PELVIS - 1-2 VIEW COMPARISON:  10/10/2014 FINDINGS: Stable evidence of prior left-sided pubic fractures, fused appearance of pubic symphysis and prior screw fixation across the right sacroiliac joint. No acute fracture or evidence of bony destruction. Moderate degenerative disease present at the level of the right hip joint and visualized lower lumbar spine. Visualized portion of a left hip arthroplasty shows normal alignment. IMPRESSION: Stable evidence of prior  left-sided pubic fractures, fused appearance of pubic symphysis and operative screw fixation across the right sacroiliac joint. Moderate osteoarthritis of the right hip joint. Degenerative disc disease of the visualized lower lumbar spine. Electronically Signed   By: Aletta Edouard M.D.   On: 03/02/2019 17:21   6:12 PM Attempted repeat exam/reduction of area, still uncomfortable, guarded, unable to reduce at all.       Assessment & Plan:   Douglas Russell is a 68 y.o. male Right groin pain - Plan: POCT urinalysis dipstick  Pain in joint involving right pelvic region and thigh - Plan: DG Pelvis 1-2 Views  Non-recurrent unilateral inguinal hernia without obstruction or gangrene  Suspected right inguinal hernia, did report symptoms initially started about a week ago, but intermittent worsening of pain.   Although he denies any acute intestinal symptoms/nausea/vomiting, I am unable to reduce the hernia and is having significant discomfort of area.  Concern for incarcerated hernia.  Will have evaluated further through emergency room tonight.   No orders of the defined types were placed in this encounter.  Patient Instructions   Unfortunately I do think you have a right sided hernia.  Although on initial exam I thought I was able to partially reduce it, on repeat exam I am not able to reduce that hernia and I am concerned about a possible incarcerated hernia.  For that reason I do think you should be evaluated in the emergency room tonight. Please go there  after leaving our office: [facility]  [place]  [phone]   Hernia, Adult     A hernia is the bulging of an organ or tissue through a weak spot in the muscles of the abdomen (abdominal wall). Hernias develop most often near the belly button (navel) or the area where the leg meets the lower abdomen (groin). Common types of hernias include:  Incisional hernia. This type bulges through a scar from  an abdominal surgery.  Umbilical hernia. This type develops near the navel.  Inguinal hernia. This type develops in the groin or scrotum.  Femoral hernia. This type develops under the groin, in the upper thigh area.  Hiatal hernia. This type occurs when part of the stomach slides above the muscle that separates the abdomen from the chest (diaphragm). What are the causes? This condition may be caused by:  Heavy lifting.  Coughing over a long period of time.  Straining to have a bowel movement. Constipation can lead to straining.  An incision made during an abdominal surgery.  A physical problem that is present at birth (congenital defect).  Being overweight or obese.  Smoking.  Excess fluid in the abdomen.  Undescended testicles in males. What are the signs or symptoms? The main symptom is a skin-colored, rounded bulge in the area of the hernia. However, a bulge may not always be present. It may grow bigger or be more visible when you cough or strain (such as when lifting something heavy). A hernia that can be pushed back into the area (is reducible) rarely causes pain. A hernia that cannot be pushed back into the area (is incarcerated) may lose its blood supply (become strangulated). A hernia that is incarcerated may cause:  Pain.  Fever.  Nausea and vomiting.  Swelling.  Constipation. How is this diagnosed? A hernia may be diagnosed based on:  Your symptoms and medical history.  A physical exam. Your health care provider may ask you to cough or move in certain ways to see if the hernia becomes visible.  Imaging tests, such as: ? X-rays. ? Ultrasound. ? CT scan. How is this treated? A hernia that is small and painless may not need to be treated. A hernia that is large or painful may be treated with surgery. Inguinal hernias may be treated with surgery to prevent incarceration or strangulation. Strangulated hernias are always treated with surgery because a lack of  blood supply to the trapped organ or tissue can cause it to die. Surgery to treat a hernia involves pushing the bulge back into place and repairing the weak area of the muscle or abdominal wall. Follow these instructions at home: Activity  Avoid straining.  Do not lift anything that is heavier than 10 lb (4.5 kg), or the limit that you are told, until your health care provider says that it is safe.  When lifting heavy objects, lift with your leg muscles, not your back muscles. Preventing constipation  Take actions to prevent constipation. Constipation leads to straining with bowel movements, which can make a hernia worse or cause a hernia repair to break down. Your health care provider may recommend that you: ? Drink enough fluid to keep your urine pale yellow. ? Eat foods that are high in fiber, such as fresh fruits and vegetables, whole grains, and beans. ? Limit foods that are high in fat and processed sugars, such as fried or sweet foods. ? Take an over-the-counter or prescription medicine for constipation.  General instructions  When coughing, try to cough gently.  You may try to push the hernia back in place by very gently pressing on it while lying down. Do not try to force the bulge back in if it will not push in easily.  If you are overweight, work with your health care provider to lose weight safely.  Do not use any products that contain nicotine or tobacco, such as cigarettes and e-cigarettes. If you need help quitting, ask your health care provider.  If you are scheduled for hernia repair, watch your hernia for any changes in shape, size, or color. Tell your health care provider about any changes or new symptoms.  Take over-the-counter and prescription medicines only as told by your health care provider.  Keep all follow-up visits as told by your health care provider. This is important. Contact a health care provider if:  You develop new pain, swelling, or redness around  your hernia.  You have signs of constipation, such as: ? Fewer bowel movements in a week than normal. ? Difficulty having a bowel movement. ? Stools that are dry, hard, or larger than normal. Get help right away if:  You have a fever.  You have abdomen pain that gets worse.  You feel nauseous or you vomit.  You cannot push the hernia back in place by very gently pressing on it while lying down. Do not try to force the bulge back in if it will not push in easily.  The hernia: ? Changes in shape, size, or color. ? Feels hard or tender. These symptoms may represent a serious problem that is an emergency. Do not wait to see if the symptoms will go away. Get medical help right away. Call your local emergency services (911 in the [place] Summary  A hernia is the bulging of an organ or tissue through a weak spot in the muscles of the abdomen (abdominal wall).  The main symptom is a skin-colored, rounded lump (bulge) in the hernia area. However, a bulge may not always be present. It may grow bigger or more visible when you cough or strain (such as when having a bowel movement).  A hernia that is small and painless may not need to be treated. A hernia that is large or painful may be treated with surgery.  Surgery to treat a hernia involves pushing the bulge back into place and repairing the weak part of the abdomen. This information is not intended to replace advice given to you by your health care provider. Make sure you discuss any questions you have with your health care provider. Document Released: 12/15/2005 Document Revised: 09/16/2017 Document Reviewed: 09/16/2017 Elsevier Interactive Patient Education  [employer].   If you have lab work done today you will be contacted with your lab results within the next 2 weeks.  If you have not heard from [place] then please contact us. The fastest way to get your results is to register for My Chart.   IF you received an x-ray today, you  will receive an invoice from [facility]. Please contact [facility] at [phone] with questions or concerns regarding your invoice.   IF you received labwork today, you will receive an invoice from [facility]. Please contact [facility] at [phone] with questions or concerns regarding your invoice.   Our billing staff will not be able to assist you with questions regarding bills from these companies.  You will be contacted with the lab results as soon as they are  available. The fastest way to get your results is to activate your My Chart account. Instructions are located on the last page of this paperwork. If you have not heard from [place] regarding the results in 2 weeks, please contact this office.

## 2019-03-02 NOTE — ED Provider Notes (Addendum)
[facility] DEPT Provider Note   CSN: [id number] Arrival date & time: 03/02/19  1858    History   Chief Complaint Chief Complaint  Patient presents with  . Inguinal Hernia    HPI Douglas Russell is a 68 y.o. male.     HPI  68 year old male without significant medical history comes into the ER with chief complaint of incarcerated hernia.  He reports that last week he coughed while brushing his teeth and had a sudden onset pain in the right inguinal region.  Since then he has had nagging pain off and on while he has been at work.  Today the pain got severe and so he went to see his PCP who advised him to come to the ER for concerns for incarcerated hernia.  Patient does have a bulge in his groin.  He denies any burning with urination, blood in the urine.  He reports that the pain is located in the right inguinal region only and there is no radiation of the pain to the back.  There is no history of similar symptoms in the past.  Past Medical History:  Diagnosis Date  . Arthritis    L&R hip & pelvis   . Asthma    has had since he was a child, states he only uses the inhaler PRN  . Cataract   . Glaucoma   . Glaucoma    both eyes  . History of blood transfusion    /w pelvic fx.   Marland Kitchen Hx of adenomatous polyp of colon 01/25/2015  . Osteoarthritis of left hip 10/10/2014  . Pneumonia    in hosp.,[facility] ( /w pelvic fx)-     Patient Active Problem List   Diagnosis Date Noted  . Hx of adenomatous polyp of colon 01/25/2015  . Status post left hip replacement 10/13/2014  . Asthma, chronic 10/13/2014  . Osteoarthritis of left hip 10/10/2014  . Hip arthritis 10/10/2014  . History of asthma 08/08/2014  . History of umbilical hernia 20/25/4270  . History of pelvic fracture 08/08/2014    Past Surgical History:  Procedure Laterality Date  . back cyst  2006   surgery x4 for cyst on the back   . EYE SURGERY Bilateral March 2016   Dr. Marylynn Pearson, [place]    . FRACTURE SURGERY Right 1995   hip  . HERNIA REPAIR  [id number]   umbilical   . PELVIC FRACTURE SURGERY  1995  . TOTAL HIP ARTHROPLASTY Left 10/10/2014   dr Mardelle Matte  . TOTAL HIP ARTHROPLASTY Left 10/10/2014   Procedure: TOTAL HIP ARTHROPLASTY;  Surgeon: Johnny Bridge, MD;  Location: [facility];  Service: Orthopedics;  Laterality: Left;        Home Medications    Prior to Admission medications   Medication Sig Start Date End Date Taking? Authorizing Provider  bimatoprost (LUMIGAN) 0.01 % SOLN Place 1 drop into both eyes at bedtime.    Yes [provider]  cetirizine (ZYRTEC) 10 MG tablet Take 1 tablet (10 mg total) by mouth daily. 04/24/18  Yes English, Colletta Maryland D, PA  CINNAMON PO Take 1 capsule by mouth daily.   Yes [provider]  fish oil-omega-3 fatty acids 1000 MG capsule Take 1 g by mouth daily.   Yes [provider]  fluticasone (FLOVENT HFA) 44 MCG/ACT inhaler Inhale 2 puffs into the lungs 2 (two) times daily. 01/07/19  Yes Wendie Agreste, MD  Garlic 6283 MG CAPS Take 1 capsule by  mouth daily.    Yes [provider]  Melatonin 10 MG TABS Take 10 mg by mouth at bedtime.   Yes [provider]  meloxicam (MOBIC) 15 MG tablet Take 15 mg by mouth daily as needed for pain.   Yes [provider]  Multiple Vitamin (MULITIVITAMIN WITH MINERALS) TABS Take 1 tablet by mouth daily.   Yes [provider]  SIMBRINZA 1-0.2 % SUSP Place 1 drop into both eyes 3 (three) times daily.  02/05/19  Yes [provider]  tamsulosin (FLOMAX) 0.4 MG CAPS capsule Take 1 capsule (0.4 mg total) by mouth daily. 03/28/17  Yes Wendie Agreste, MD  Turmeric Curcumin 500 MG CAPS Take 500 mg by mouth daily.   Yes [provider]  acetaminophen (TYLENOL 8 HOUR) 650 MG CR tablet Take 1 tablet (650 mg total) by mouth every 8 (eight) hours as needed. 03/02/19   Varney Biles, MD  albuterol (PROVENTIL HFA;VENTOLIN HFA) 108 (90 Base) MCG/ACT  inhaler Inhale 2 puffs into the lungs every 6 (six) hours as needed for wheezing or shortness of breath. Ok to fill with preferred pro air 01/07/19   Wendie Agreste, MD    Family History Family History  Problem Relation Age of Onset  . Hypertension Mother   . Diabetes Father   . Hypertension Sister   . Cancer Maternal Grandmother   . Hypertension Sister   . Colon cancer Neg Hx     Social History Social History   Tobacco Use  . Smoking status: Never Smoker  . Smokeless tobacco: Never Used  Substance Use Topics  . Alcohol use: Yes    Alcohol/week: 0.0 standard drinks    Comment: occasional glass of wine  . Drug use: No     Allergies   Shellfish allergy; Amoxicillin; and Trazodone and nefazodone   Review of Systems Review of Systems  Constitutional: Positive for activity change.  Gastrointestinal: Positive for abdominal pain. Negative for nausea and vomiting.  Genitourinary: Negative for decreased urine volume, difficulty urinating, dysuria, flank pain, scrotal swelling and testicular pain.  Skin: Negative for rash.  Allergic/Immunologic: Negative for immunocompromised state.  Hematological: Does not bruise/bleed easily.  All other systems reviewed and are negative.    Physical Exam Updated Vital Signs BP (!) 152/91 (BP Location: Right Arm)   Pulse 73   Temp 98.1 F (36.7 C) (Oral)   Resp 18   SpO2 99%   Physical Exam Vitals signs and nursing note reviewed.  Constitutional:      Appearance: He is well-developed.  HENT:     Head: Atraumatic.  Neck:     Musculoskeletal: Neck supple.  Cardiovascular:     Rate and Rhythm: Normal rate.  Pulmonary:     Effort: Pulmonary effort is normal.  Abdominal:     Tenderness: There is abdominal tenderness.     Hernia: A hernia is present.  Genitourinary:    Comments: There is no evidence of hernia within the scrotum. There is a bulge in the suprapubic region above the inguinal crease.  There is significant  tenderness to palpation in that area.  No overlying skin discoloration.  Negative McBurney's. Skin:    General: Skin is warm.  Neurological:     Mental Status: He is alert and oriented to person, place, and time.      ED Treatments / Results  Labs (all labs ordered are listed, but only abnormal results are displayed) Labs Reviewed  BASIC METABOLIC PANEL  CBC WITH  DIFFERENTIAL/PLATELET    EKG None  Radiology Dg Pelvis 1-2 Views  Result Date: 03/02/2019 CLINICAL DATA:  History of prior pelvic fracture with surgery and left hip replacement. Anterior right pubic and groin pain. EXAM: PELVIS - 1-2 VIEW COMPARISON:  10/10/2014 FINDINGS: Stable evidence of prior left-sided pubic fractures, fused appearance of pubic symphysis and prior screw fixation across the right sacroiliac joint. No acute fracture or evidence of bony destruction. Moderate degenerative disease present at the level of the right hip joint and visualized lower lumbar spine. Visualized portion of a left hip arthroplasty shows normal alignment. IMPRESSION: Stable evidence of prior left-sided pubic fractures, fused appearance of pubic symphysis and operative screw fixation across the right sacroiliac joint. Moderate osteoarthritis of the right hip joint. Degenerative disc disease of the visualized lower lumbar spine. Electronically Signed   By: Aletta Edouard M.D.   On: 03/02/2019 17:21    Procedures Hernia reduction Date/Time: 03/02/2019 9:28 PM Performed by: Varney Biles, MD Authorized by: Varney Biles, MD  Unsuccessful attempt Consent: Verbal consent obtained. Risks and benefits: risks, benefits and alternatives were discussed Consent given by: patient Patient understanding: patient states understanding of the procedure being performed Patient identity confirmed: arm band Time out: Immediately prior to procedure a "time out" was called to verify the correct patient, procedure, equipment, support staff and site/side  marked as required. Local anesthesia used: no  Anesthesia: Local anesthesia used: no  Sedation: Patient sedated: no  Patient tolerance: Patient tolerated the procedure well with no immediate complications Comments: We attempted to reduce the hernia.  There was partial reduction of the hernia but not complete. Patient felt better after our attempt, but given the incomplete reduction we put him in Trendelenburg position which seem to have over time reduce the hernia.    (including critical care time)  Medications Ordered in ED Medications  HYDROmorphone (DILAUDID) injection 1 mg (1 mg Intravenous Given 03/02/19 2041)     Initial Impression / Assessment and Plan / ED Course  I have reviewed the triage vital signs and the nursing notes.  Pertinent labs & imaging results that were available during my care of the patient were reviewed by me and considered in my medical decision making (see chart for details).  Clinical Course as of Mar 01 2229  Wed Mar 02, 2019  2230 Results from the ER workup discussed with the patient face to face and all questions answered to the best of my ability.    [AN]    Clinical Course User Index [AN] Varney Biles, MD      68 year old male comes in a chief complaint of right inguinal pain. He has no significant medical history.  There is no testicular pain, swelling of the right hemiscrotum, no hernia within the scrotum and no UTI-like symptoms.  There is also no flank pain or McBurney's.  Exam is consistent with right inguinal hernia.  Doubt that is incarcerated because patient appears comfortable when he is walking and there is no skin discoloration.  Additionally he reports that he has had pain now for several days.  Attempt at bedside hernia reduction was not entirely successful, therefore we called general surgery. Dr. Ninfa Linden has seen the patient and when he went to assess the patient the hernia had resolved.  Patient therefore is appropriate  for outpatient management.  Labs are pending so we will discharge him once to have resulted.   Final Clinical Impressions(s) / ED Diagnoses   Final diagnoses:  Right inguinal hernia  ED Discharge Orders         Ordered    acetaminophen (TYLENOL 8 HOUR) 650 MG CR tablet  Every 8 hours PRN     03/02/19 2226           Varney Biles, MD 03/02/19 2131    Varney Biles, MD 03/02/19 2230

## 2019-03-04 ENCOUNTER — Emergency Department (HOSPITAL_COMMUNITY)
Admission: EM | Admit: 2019-03-04 | Discharge: 2019-03-04 | Disposition: A | Discharge status: Home / Self Care | Payer: Medicare HMO | Attending: Emergency Medicine | Admitting: Emergency Medicine

## 2019-03-04 ENCOUNTER — Other Ambulatory Visit: Payer: Self-pay

## 2019-03-04 ENCOUNTER — Encounter (HOSPITAL_COMMUNITY): Payer: Self-pay | Admitting: Emergency Medicine

## 2019-03-04 DIAGNOSIS — Z79899 Other long term (current) drug therapy: Secondary | ICD-10-CM | POA: Diagnosis not present

## 2019-03-04 DIAGNOSIS — J45909 Unspecified asthma, uncomplicated: Secondary | ICD-10-CM | POA: Diagnosis not present

## 2019-03-04 DIAGNOSIS — R1030 Lower abdominal pain, unspecified: Secondary | ICD-10-CM | POA: Diagnosis present

## 2019-03-04 DIAGNOSIS — K4090 Unilateral inguinal hernia, without obstruction or gangrene, not specified as recurrent: Secondary | ICD-10-CM | POA: Diagnosis not present

## 2019-03-04 DIAGNOSIS — Z96642 Presence of left artificial hip joint: Secondary | ICD-10-CM | POA: Diagnosis not present

## 2019-03-04 NOTE — ED Triage Notes (Signed)
Pt c/o hernia and R groin pain. Pt recently seen and treated for R inguinal hernia and states it has popped back out and started hurting again.

## 2019-03-04 NOTE — ED Notes (Signed)
Pt is alert and orinted x 4 and is verbally responsive. Pt reports that he has an inguinal hernia in which he awoke this morning and was having pain. Pt states that since he has arrived to ED he has not had pain and hernia seem to have " pop back in"/ 0/10. Pt denies n/v at this time.

## 2019-03-04 NOTE — Discharge Instructions (Addendum)
Your exam today did not show incarcerated hernia.  As your symptoms have resolved, we did not feel it was necessary to do imaging or other lab testing.  You were observed for several hours and did not have further hernia problems.  Please call your surgeon to follow-up for further management.  If any symptoms change or worsen that you cannot reduce at home, please return to the nearest emergency department.

## 2019-03-04 NOTE — ED Provider Notes (Signed)
[facility] DEPT Provider Note   CSN: [id number] Arrival date & time: 03/04/19  1331    History   Chief Complaint Chief Complaint  Patient presents with  . Inguinal Hernia    HPI Douglas Russell is a 68 y.o. male.     The history is provided by the patient and medical records. No language interpreter was used.  Groin Pain  This is a recurrent problem. The current episode started 3 to 5 hours ago. The problem occurs constantly. The problem has been gradually improving. Pertinent negatives include no chest pain, no abdominal pain, no headaches and no shortness of breath. Nothing aggravates the symptoms. Nothing relieves the symptoms. He has tried nothing for the symptoms. The treatment provided no relief.    Past Medical History:  Diagnosis Date  . Arthritis    L&R hip & pelvis   . Asthma    has had since he was a child, states he only uses the inhaler PRN  . Cataract   . Glaucoma   . Glaucoma    both eyes  . History of blood transfusion    /w pelvic fx.   Marland Kitchen Hx of adenomatous polyp of colon 01/25/2015  . Osteoarthritis of left hip 10/10/2014  . Pneumonia    in hosp.,[facility] ( /w pelvic fx)-     Patient Active Problem List   Diagnosis Date Noted  . Hx of adenomatous polyp of colon 01/25/2015  . Status post left hip replacement 10/13/2014  . Asthma, chronic 10/13/2014  . Osteoarthritis of left hip 10/10/2014  . Hip arthritis 10/10/2014  . History of asthma 08/08/2014  . History of umbilical hernia 72/53/6644  . History of pelvic fracture 08/08/2014    Past Surgical History:  Procedure Laterality Date  . back cyst  2006   surgery x4 for cyst on the back   . EYE SURGERY Bilateral March 2016   Dr. Marylynn Pearson, [place]  . FRACTURE SURGERY Right 1995   hip  . HERNIA REPAIR  0347   umbilical   . PELVIC FRACTURE SURGERY  1995  . TOTAL HIP ARTHROPLASTY Left 10/10/2014   dr Mardelle Matte  . TOTAL HIP ARTHROPLASTY Left 10/10/2014   Procedure:  TOTAL HIP ARTHROPLASTY;  Surgeon: Johnny Bridge, MD;  Location: [facility];  Service: Orthopedics;  Laterality: Left;        Home Medications    Prior to Admission medications   Medication Sig Start Date End Date Taking? Authorizing Provider  acetaminophen (TYLENOL 8 HOUR) 650 MG CR tablet Take 1 tablet (650 mg total) by mouth every 8 (eight) hours as needed. 03/02/19   Varney Biles, MD  albuterol (PROVENTIL HFA;VENTOLIN HFA) 108 (90 Base) MCG/ACT inhaler Inhale 2 puffs into the lungs every 6 (six) hours as needed for wheezing or shortness of breath. Ok to fill with preferred pro air 01/07/19   Wendie Agreste, MD  bimatoprost (LUMIGAN) 0.01 % SOLN Place 1 drop into both eyes at bedtime.     [provider]  cetirizine (ZYRTEC) 10 MG tablet Take 1 tablet (10 mg total) by mouth daily. 04/24/18   Ivar Drape D, PA  CINNAMON PO Take 1 capsule by mouth daily.    [provider]  fish oil-omega-3 fatty acids 1000 MG capsule Take 1 g by mouth daily.    [provider]  fluticasone (FLOVENT HFA) 44 MCG/ACT inhaler Inhale 2 puffs into the lungs 2 (two) times daily. 01/07/19   Wendie Agreste,  MD  Garlic 1443 MG CAPS Take 1 capsule by mouth daily.     [provider]  Melatonin 10 MG TABS Take 10 mg by mouth at bedtime.    [provider]  meloxicam (MOBIC) 15 MG tablet Take 15 mg by mouth daily as needed for pain.    [provider]  Multiple Vitamin (MULITIVITAMIN WITH MINERALS) TABS Take 1 tablet by mouth daily.    [provider]  SIMBRINZA 1-0.2 % SUSP Place 1 drop into both eyes 3 (three) times daily.  02/05/19   [provider]  tamsulosin (FLOMAX) 0.4 MG CAPS capsule Take 1 capsule (0.4 mg total) by mouth daily. 03/28/17   Wendie Agreste, MD  Turmeric Curcumin 500 MG CAPS Take 500 mg by mouth daily.    [provider]    Family History Family History  Problem Relation Age of Onset  . Hypertension Mother    . Diabetes Father   . Hypertension Sister   . Cancer Maternal Grandmother   . Hypertension Sister   . Colon cancer Neg Hx     Social History Social History   Tobacco Use  . Smoking status: Never Smoker  . Smokeless tobacco: Never Used  Substance Use Topics  . Alcohol use: Yes    Alcohol/week: 0.0 standard drinks    Comment: occasional glass of wine  . Drug use: No     Allergies   Shellfish allergy; Amoxicillin; and Trazodone and nefazodone   Review of Systems Review of Systems  Constitutional: Negative for chills, diaphoresis, fatigue and fever.  HENT: Negative for congestion.   Eyes: Negative for visual disturbance.  Respiratory: Negative for choking, chest tightness, shortness of breath and wheezing.   Cardiovascular: Negative for chest pain, palpitations and leg swelling.  Gastrointestinal: Negative for abdominal distention, abdominal pain, constipation, diarrhea, nausea and vomiting.  Genitourinary: Negative for decreased urine volume, discharge, dysuria, flank pain, frequency, penile pain, penile swelling, scrotal swelling and testicular pain.  Musculoskeletal: Negative for back pain, neck pain and neck stiffness.  Skin: Negative for rash and wound.  Neurological: Negative for light-headedness and headaches.  Psychiatric/Behavioral: Negative for agitation.  All other systems reviewed and are negative.    Physical Exam Updated Vital Signs BP (!) 155/92 (BP Location: Left Arm)   Pulse 96   Temp 98.3 F (36.8 C) (Oral)   Resp 17   Ht 6' (1.829 m)   Wt 83.9 kg   SpO2 100%   BMI 25.09 kg/m   Physical Exam Vitals signs and nursing note reviewed.  Constitutional:      General: He is not in acute distress.    Appearance: He is well-developed. He is not ill-appearing, toxic-appearing or diaphoretic.  HENT:     Head: Normocephalic and atraumatic.     Mouth/Throat:     Pharynx: No oropharyngeal exudate or posterior oropharyngeal erythema.  Eyes:      Conjunctiva/sclera: Conjunctivae normal.     Pupils: Pupils are equal, round, and reactive to light.  Neck:     Musculoskeletal: Neck supple.  Cardiovascular:     Rate and Rhythm: Normal rate and regular rhythm.     Heart sounds: No murmur.  Pulmonary:     Effort: Pulmonary effort is normal. No respiratory distress.     Breath sounds: Normal breath sounds.  Abdominal:     General: Abdomen is flat. Bowel sounds are normal. There is no distension.     Palpations: Abdomen is soft.  Tenderness: There is no abdominal tenderness. There is no right CVA tenderness, left CVA tenderness, guarding or rebound.     Hernia: No hernia (resolved) is present. There is no hernia in the umbilical area, ventral area, right inguinal area, left inguinal area, right femoral area or left femoral area.  Genitourinary:    Pubic Area: No rash.      Penis: No tenderness.      Scrotum/Testes:        Right: Tenderness not present.        Left: Tenderness not present.     Epididymis:     Right: No tenderness.     Left: No tenderness.    Musculoskeletal:        General: No tenderness.  Lymphadenopathy:     Lower Body: No right inguinal adenopathy. No left inguinal adenopathy.  Skin:    General: Skin is warm and dry.     Capillary Refill: Capillary refill takes less than 2 seconds.  Neurological:     Mental Status: He is alert.  Psychiatric:        Mood and Affect: Mood normal.      ED Treatments / Results  Labs (all labs ordered are listed, but only abnormal results are displayed) Labs Reviewed - No data to display  EKG None  Radiology Dg Pelvis 1-2 Views  Result Date: 03/02/2019 CLINICAL DATA:  History of prior pelvic fracture with surgery and left hip replacement. Anterior right pubic and groin pain. EXAM: PELVIS - 1-2 VIEW COMPARISON:  10/10/2014 FINDINGS: Stable evidence of prior left-sided pubic fractures, fused appearance of pubic symphysis and prior screw fixation across the right  sacroiliac joint. No acute fracture or evidence of bony destruction. Moderate degenerative disease present at the level of the right hip joint and visualized lower lumbar spine. Visualized portion of a left hip arthroplasty shows normal alignment. IMPRESSION: Stable evidence of prior left-sided pubic fractures, fused appearance of pubic symphysis and operative screw fixation across the right sacroiliac joint. Moderate osteoarthritis of the right hip joint. Degenerative disc disease of the visualized lower lumbar spine. Electronically Signed   By: Aletta Edouard M.D.   On: 03/02/2019 17:21    Procedures Procedures (including critical care time)  Medications Ordered in ED Medications - No data to display   Initial Impression / Assessment and Plan / ED Course  I have reviewed the triage vital signs and the nursing notes.  Pertinent labs & imaging results that were available during my care of the patient were reviewed by me and considered in my medical decision making (see chart for details).        AHMAD VANWEY is a 68 y.o. male with a past medical history significant for asthma and prior right inguinal hernia who presents with right inguinal pain.  Patient reports that recently he was seen for incarcerated right inguinal hernia.  It was able to be reduced in the emergency department and surgery saw him.  Patient is scheduled to see surgery in 2 weeks to discuss further management however patient reports while brushing his teeth this morning, his hernia popped out.  He reports he was unable to reduce it at home and presents for evaluation.  He denies any nausea vomiting and reports he had a bowel movement after the episode.  He denies any urinary symptoms.  Reports the pain was 10 out of 10 at onset but is now a 2 out of 10.  He denies any testicle pain, shortness of  breath, chest pain, palpitations, or other complaints.  He has not taken any medicine for symptoms yet.  On exam, patient's  abdomen was nontender.  Patient was examined and his right inguinal hernia has already reduced.  There was no hernia palpated on exam.  No testicle pain or other [place] on exam.  Lungs clear chest nontender.  Given his resolved hernia, do not feel he needs further imaging labs or surgical consultation at this time.  Patient will be observed for period of time to make sure it does not pop out again and get incarcerated.  Patient was able to eat and drink and ambulate without difficulty.  As this is now a recurrent problem, patient occurs to call surgery to try and see them sooner.  Patient was given instructions on different techniques for hernia reduction and further management.  Patient no other questions or concerns and was discharged in good condition with resolution of his right inguinal hernia pain.        Final Clinical Impressions(s) / ED Diagnoses   Final diagnoses:  Right inguinal hernia    ED Discharge Orders    None     Clinical Impression: 1. Right inguinal hernia     Disposition: Discharge  Condition: Good  I have discussed the results, Dx and Tx plan with the pt(& family if present). He/she/they expressed understanding and agree(s) with the plan. Discharge instructions discussed at great length. Strict return precautions discussed and pt &/or family have verbalized understanding of the instructions. No further questions at time of discharge.    Discharge Medication List as of 03/04/2019  3:39 PM      Follow Up: Wendie Agreste, MD [address] [place] [address] [phone]     Your general surgery team         Mosiah Bastin, Gwenyth Allegra, MD 03/04/19 [phone]

## 2019-03-14 ENCOUNTER — Other Ambulatory Visit: Payer: Self-pay | Admitting: Surgery

## 2019-03-14 DIAGNOSIS — K4090 Unilateral inguinal hernia, without obstruction or gangrene, not specified as recurrent: Secondary | ICD-10-CM | POA: Diagnosis not present

## 2019-03-15 DIAGNOSIS — H2513 Age-related nuclear cataract, bilateral: Secondary | ICD-10-CM | POA: Diagnosis not present

## 2019-03-15 DIAGNOSIS — H401132 Primary open-angle glaucoma, bilateral, moderate stage: Secondary | ICD-10-CM | POA: Diagnosis not present

## 2019-04-13 ENCOUNTER — Encounter (HOSPITAL_COMMUNITY): Admission: RE | Payer: Self-pay | Source: Ambulatory Visit

## 2019-04-13 ENCOUNTER — Ambulatory Visit (HOSPITAL_COMMUNITY): Admission: RE | Admit: 2019-04-13 | Payer: Medicare HMO | Source: Ambulatory Visit | Admitting: Surgery

## 2019-04-13 SURGERY — REPAIR, HERNIA, INGUINAL, ADULT
Anesthesia: General | Laterality: Right

## 2019-05-10 ENCOUNTER — Other Ambulatory Visit: Payer: Self-pay | Admitting: Emergency Medicine

## 2019-05-10 ENCOUNTER — Telehealth: Payer: Self-pay | Admitting: Family Medicine

## 2019-05-10 DIAGNOSIS — Z8709 Personal history of other diseases of the respiratory system: Secondary | ICD-10-CM

## 2019-05-10 MED ORDER — CETIRIZINE HCL 10 MG PO TABS: 10.0000 mg | ORAL_TABLET | Freq: Every day | ORAL | 2 refills | Status: DC

## 2019-05-10 NOTE — Pre-Procedure Instructions (Signed)
KENSINGTON DUERST  05/10/2019     [facility] - [place], [place] [address] Phone: [phone] Fax: [phone]  [facility] - [place], [place] [address] Phone: [phone] Fax: [phone]   Your procedure is scheduled on Friday, May 15th  Report to [facility] Entrance A at 12:30 P.M.  Call this number if you have problems the morning of surgery:  [phone]   Remember:  Do not eat after midnight.  You may drink clear liquids until 11:30 A.M. Clear liquids allowed are:    Water, Juice (non-citric and without pulp), Carbonated beverages, Clear Tea, Black Coffee only, Plain Jell-O only, Gatorade and Plain Popsicles only    Take these medicines the morning of surgery with A SIP OF WATER  cetirizine (ZYRTEC) fluticasone (FLOVENT HFA)  SIMBRINZA 1-0.2 % SUSP  If needed - albuterol (PROVENTIL HFA;VENTOLIN HFA) - bring inhaler with you day of surgery  Follow your surgeon's instructions on when to stop Aspirin.  If no instructions were given by your surgeon then you will need to call the office to get those instructions.    7 days prior to surgery STOP taking any Aspirin (unless otherwise instructed by your surgeon), Aleve, Naproxen, Ibuprofen, Motrin, Advil, Goody's, BC's, all herbal medications, fish oil, and all vitamins.    Do not wear jewelry, make-up or nail polish.  Do not wear lotions, powders, or perfumes, or deodorant.  Do not shave 48 hours prior to surgery.  Men may shave face and neck.  Do not bring valuables to the hospital.  [facility] is not responsible for any belongings or valuables.  Contacts, dentures or bridgework may not be worn into surgery.  Leave your suitcase in the car.  After surgery it may be brought to your room.  For patients admitted to the hospital, discharge time will be determined by your  treatment team.  Patients discharged the day of surgery will not be allowed to drive home.   Special instructions:   [facility]- Preparing For Surgery  Before surgery, you can play an important role. Because skin is not sterile, your skin needs to be as free of germs as possible. You can reduce the number of germs on your skin by washing with CHG (chlorahexidine gluconate) Soap before surgery.  CHG is an antiseptic cleaner which kills germs and bonds with the skin to continue killing germs even after washing.    Oral Hygiene is also important to reduce your risk of infection.  Remember - BRUSH YOUR TEETH THE MORNING OF SURGERY WITH YOUR REGULAR TOOTHPASTE  Please do not use if you have an allergy to CHG or antibacterial soaps. If your skin becomes reddened/irritated stop using the CHG.  Do not shave (including legs and underarms) for at least 48 hours prior to first CHG shower. It is OK to shave your face.  Please follow these instructions carefully.   1. Shower the NIGHT BEFORE SURGERY and the MORNING OF SURGERY with CHG.   2. If you chose to wash your hair, wash your hair first as usual with your normal shampoo.  3. After you shampoo, rinse your hair and body thoroughly to remove the shampoo.  4. Use CHG as you would any other liquid soap. You can apply CHG directly to the skin and wash gently with a scrungie or a clean washcloth.   5. Apply  the CHG Soap to your body ONLY FROM THE NECK DOWN.  Do not use on open wounds or open sores. Avoid contact with your eyes, ears, mouth and genitals (private parts). Wash Face and genitals (private parts)  with your normal soap.  6. Wash thoroughly, paying special attention to the area where your surgery will be performed.  7. Thoroughly rinse your body with warm water from the neck down.  8. DO NOT shower/wash with your normal soap after using and rinsing off the CHG Soap.  9. Pat yourself dry with a CLEAN TOWEL.  10. Wear CLEAN PAJAMAS to  bed the night before surgery, wear comfortable clothes the morning of surgery  11. Place CLEAN SHEETS on your bed the night of your first shower and DO NOT SLEEP WITH PETS.  Day of Surgery:  Do not apply any deodorants/lotions.  Please wear clean clothes to the hospital/surgery center.   Remember to brush your teeth WITH YOUR REGULAR TOOTHPASTE.  Please read over the following fact sheets that you were given. Pain Booklet, Coughing and Deep Breathing, MRSA Information and Surgical Site Infection Prevention

## 2019-05-10 NOTE — Telephone Encounter (Unsigned)
Copied from [place]. Topic: Quick Communication - Rx Refill/Question >> May 10, 2019 12:24 PM Mathis Bud wrote: Medication: cetirizine (ZYRTEC) 10 MG   Has the patient contacted their pharmacy? Yes, Pharmacy has no refills.   Preferred Pharmacy (with phone number or street name): [facility], [place] [phone] (Phone) [phone] (Fax)    Agent: Please be advised that RX refills may take up to 3 business days. We ask that you follow-up with your pharmacy.

## 2019-05-11 ENCOUNTER — Encounter (HOSPITAL_COMMUNITY)
Admission: RE | Admit: 2019-05-11 | Discharge: 2019-05-11 | Disposition: A | Discharge status: Home / Self Care | Payer: Medicare HMO | Source: Ambulatory Visit | Attending: Surgery | Admitting: Surgery

## 2019-05-11 ENCOUNTER — Other Ambulatory Visit: Payer: Self-pay

## 2019-05-11 ENCOUNTER — Other Ambulatory Visit: Payer: Self-pay | Admitting: Surgery

## 2019-05-11 ENCOUNTER — Encounter (HOSPITAL_COMMUNITY): Payer: Self-pay

## 2019-05-11 ENCOUNTER — Other Ambulatory Visit (HOSPITAL_COMMUNITY)
Admission: RE | Admit: 2019-05-11 | Discharge: 2019-05-11 | Disposition: A | Discharge status: Home / Self Care | Payer: Medicare HMO | Source: Ambulatory Visit | Attending: Surgery | Admitting: Surgery

## 2019-05-11 DIAGNOSIS — Z1159 Encounter for screening for other viral diseases: Secondary | ICD-10-CM | POA: Insufficient documentation

## 2019-05-11 DIAGNOSIS — K4090 Unilateral inguinal hernia, without obstruction or gangrene, not specified as recurrent: Secondary | ICD-10-CM | POA: Diagnosis not present

## 2019-05-11 DIAGNOSIS — Z01812 Encounter for preprocedural laboratory examination: Secondary | ICD-10-CM | POA: Insufficient documentation

## 2019-05-11 DIAGNOSIS — M1990 Unspecified osteoarthritis, unspecified site: Secondary | ICD-10-CM | POA: Diagnosis not present

## 2019-05-11 DIAGNOSIS — Z79899 Other long term (current) drug therapy: Secondary | ICD-10-CM | POA: Diagnosis not present

## 2019-05-11 HISTORY — DX: Family history of other specified conditions: Z84.89

## 2019-05-11 LAB — CBC
HCT: 43.1 % (ref 39.0–52.0)
Hemoglobin: 14.1 g/dL (ref 13.0–17.0)
MCH: 29.3 pg (ref 26.0–34.0)
MCHC: 32.7 g/dL (ref 30.0–36.0)
MCV: 89.4 fL (ref 80.0–100.0)
Platelets: 134 10*3/uL — ABNORMAL LOW (ref 150–400)
RBC: 4.82 MIL/uL (ref 4.22–5.81)
RDW: 14.6 % (ref 11.5–15.5)
WBC: 4.3 10*3/uL (ref 4.0–10.5)
nRBC: 0 % (ref 0.0–0.2)

## 2019-05-11 LAB — SARS CORONAVIRUS 2 BY RT PCR (HOSPITAL ORDER, PERFORMED IN ~~LOC~~ HOSPITAL LAB): SARS Coronavirus 2: NEGATIVE

## 2019-05-11 NOTE — Progress Notes (Signed)
PCP - Alonza Smoker @ [facility] Cardiologist - na  Chest x-ray - na EKG - na Stress Test - 10-15 yrs. Ago: normal ECHO - na Cardiac Cath - na  Sleep Study - yrs. Ago negative CPAP -   Fasting Blood Sugar - na Checks Blood Sugar _____ times a day  Blood Thinner Instructions:na Aspirin Instructions:  Anesthesia review:   Patient denies shortness of breath, fever, cough and chest pain at PAT appointment   Patient verbalized understanding of instructions that were given to them at the PAT appointment. Patient was also instructed that they will need to review over the PAT instructions again at home before surgery.

## 2019-05-11 NOTE — Progress Notes (Signed)
  Coronavirus Screening  Have you experienced the following symptoms:  Cough no Fever (>100.4F)  no Runny nose no Sore throat no Difficulty breathing/shortness of breath  no  Have you or a family member traveled in the last 14 days and where? no   If the patient indicates "YES" to the above questions, their PAT will be rescheduled to limit the exposure to others and, the surgeon will be notified. THE PATIENT WILL NEED TO BE ASYMPTOMATIC FOR 14 DAYS.   If the patient is not experiencing any of these symptoms, the PAT nurse will instruct them to NOT bring anyone with them to their appointment since they may have these symptoms or traveled as well.   Please remind your patients and families that hospital visitation restrictions are in effect and the importance of the restrictions.  

## 2019-05-12 NOTE — Anesthesia Preprocedure Evaluation (Addendum)
Anesthesia Evaluation  Patient identified by MRN, date of birth, ID band Patient awake    Reviewed: Allergy & Precautions, NPO status , Patient's Chart, lab work & pertinent test results  History of Anesthesia Complications (+) Family history of anesthesia reactionNegative for: history of anesthetic complications  Airway Mallampati: II  TM Distance: >3 FB Neck ROM: Full    Dental no notable dental hx. (+) Dental Advisory Given   Pulmonary asthma ,    Pulmonary exam normal breath sounds clear to auscultation       Cardiovascular negative cardio ROS Normal cardiovascular exam Rhythm:Regular Rate:Normal     Neuro/Psych negative neurological ROS     GI/Hepatic negative GI ROS, Neg liver ROS,   Endo/Other  negative endocrine ROS  Renal/GU negative Renal ROS     Musculoskeletal  (+) Arthritis ,   Abdominal   Peds  Hematology negative hematology ROS (+)   Anesthesia Other Findings Day of surgery medications reviewed with the patient.  Reproductive/Obstetrics                            Anesthesia Physical Anesthesia Plan  ASA: II  Anesthesia Plan: General   Post-op Pain Management: GA combined w/ Regional for post-op pain   Induction: Intravenous  PONV Risk Score and Plan: 3 and Midazolam, Ondansetron, Dexamethasone and Treatment may vary due to age or medical condition  Airway Management Planned: LMA  Additional Equipment:   Intra-op Plan:   Post-operative Plan: Extubation in OR  Informed Consent: I have reviewed the patients History and Physical, chart, labs and discussed the procedure including the risks, benefits and alternatives for the proposed anesthesia with the patient or authorized representative who has indicated his/her understanding and acceptance.     Dental advisory given  Plan Discussed with: CRNA  Anesthesia Plan Comments:        Anesthesia Quick  Evaluation

## 2019-05-13 ENCOUNTER — Ambulatory Visit (HOSPITAL_COMMUNITY): Payer: Medicare HMO | Admitting: Certified Registered Nurse Anesthetist

## 2019-05-13 ENCOUNTER — Encounter (HOSPITAL_COMMUNITY)
Admission: RE | Disposition: A | Discharge status: Home / Self Care | Payer: Self-pay | Source: Home / Self Care | Attending: Surgery

## 2019-05-13 ENCOUNTER — Other Ambulatory Visit: Payer: Self-pay

## 2019-05-13 ENCOUNTER — Ambulatory Visit (HOSPITAL_COMMUNITY)
Admission: RE | Admit: 2019-05-13 | Discharge: 2019-05-13 | Disposition: A | Discharge status: Home / Self Care | Payer: Medicare HMO | Attending: Surgery | Admitting: Surgery

## 2019-05-13 ENCOUNTER — Encounter (HOSPITAL_COMMUNITY): Payer: Self-pay | Admitting: *Deleted

## 2019-05-13 DIAGNOSIS — M1990 Unspecified osteoarthritis, unspecified site: Secondary | ICD-10-CM | POA: Diagnosis not present

## 2019-05-13 DIAGNOSIS — K4090 Unilateral inguinal hernia, without obstruction or gangrene, not specified as recurrent: Secondary | ICD-10-CM | POA: Diagnosis not present

## 2019-05-13 DIAGNOSIS — Z1159 Encounter for screening for other viral diseases: Secondary | ICD-10-CM | POA: Insufficient documentation

## 2019-05-13 DIAGNOSIS — M1612 Unilateral primary osteoarthritis, left hip: Secondary | ICD-10-CM | POA: Diagnosis not present

## 2019-05-13 DIAGNOSIS — Z79899 Other long term (current) drug therapy: Secondary | ICD-10-CM | POA: Insufficient documentation

## 2019-05-13 DIAGNOSIS — G8918 Other acute postprocedural pain: Secondary | ICD-10-CM | POA: Diagnosis not present

## 2019-05-13 DIAGNOSIS — J45909 Unspecified asthma, uncomplicated: Secondary | ICD-10-CM | POA: Diagnosis not present

## 2019-05-13 HISTORY — PX: INGUINAL HERNIA REPAIR: SHX194

## 2019-05-13 SURGERY — REPAIR, HERNIA, INGUINAL, ADULT
Anesthesia: General | Site: Inguinal | Laterality: Right

## 2019-05-13 MED ORDER — GABAPENTIN 300 MG PO CAPS
ORAL_CAPSULE | ORAL | Status: AC
  Administered 2019-05-13: 300 mg via ORAL
  Filled 2019-05-13: qty 1

## 2019-05-13 MED ORDER — EPHEDRINE 5 MG/ML INJ
INTRAVENOUS | Status: AC
  Filled 2019-05-13: qty 10

## 2019-05-13 MED ORDER — MIDAZOLAM HCL 2 MG/2ML IJ SOLN
INTRAMUSCULAR | Status: AC
  Filled 2019-05-13: qty 2

## 2019-05-13 MED ORDER — BUPIVACAINE-EPINEPHRINE 0.5% -1:200000 IJ SOLN
INTRAMUSCULAR | Status: DC | PRN
  Administered 2019-05-13: 18 mL

## 2019-05-13 MED ORDER — ACETAMINOPHEN 500 MG PO TABS
1000.0000 mg | ORAL_TABLET | ORAL | Status: AC
  Administered 2019-05-13: 13:00:00 1000 mg via ORAL

## 2019-05-13 MED ORDER — PROMETHAZINE HCL 25 MG/ML IJ SOLN: 6.2500 mg | INTRAMUSCULAR | Status: DC | PRN

## 2019-05-13 MED ORDER — BUPIVACAINE HCL (PF) 0.5 % IJ SOLN
INTRAMUSCULAR | Status: AC
  Filled 2019-05-13: qty 30

## 2019-05-13 MED ORDER — OXYCODONE HCL 5 MG PO TABS: 5.0000 mg | ORAL_TABLET | Freq: Once | ORAL | Status: DC | PRN

## 2019-05-13 MED ORDER — OXYCODONE HCL 5 MG/5ML PO SOLN: 5.0000 mg | Freq: Once | ORAL | Status: DC | PRN

## 2019-05-13 MED ORDER — FENTANYL CITRATE (PF) 100 MCG/2ML IJ SOLN
INTRAMUSCULAR | Status: AC
  Administered 2019-05-13: 50 ug via INTRAVENOUS
  Filled 2019-05-13: qty 2

## 2019-05-13 MED ORDER — FENTANYL CITRATE (PF) 250 MCG/5ML IJ SOLN
INTRAMUSCULAR | Status: AC
  Filled 2019-05-13: qty 5

## 2019-05-13 MED ORDER — OXYCODONE HCL 5 MG PO TABS: 5.0000 mg | ORAL_TABLET | Freq: Four times a day (QID) | ORAL | 0 refills | Status: DC | PRN

## 2019-05-13 MED ORDER — DEXAMETHASONE SODIUM PHOSPHATE 10 MG/ML IJ SOLN
INTRAMUSCULAR | Status: DC | PRN
  Administered 2019-05-13: 4 mg via INTRAVENOUS

## 2019-05-13 MED ORDER — LACTATED RINGERS IV SOLN
INTRAVENOUS | Status: DC
  Administered 2019-05-13: 1000 mL via INTRAVENOUS

## 2019-05-13 MED ORDER — FENTANYL CITRATE (PF) 100 MCG/2ML IJ SOLN
100.0000 ug | Freq: Once | INTRAMUSCULAR | Status: AC
  Administered 2019-05-13: 50 ug via INTRAVENOUS

## 2019-05-13 MED ORDER — CEFAZOLIN SODIUM-DEXTROSE 2-4 GM/100ML-% IV SOLN
2.0000 g | INTRAVENOUS | Status: AC
  Administered 2019-05-13: 15:00:00 2 g via INTRAVENOUS

## 2019-05-13 MED ORDER — MIDAZOLAM HCL 2 MG/2ML IJ SOLN
INTRAMUSCULAR | Status: AC
  Administered 2019-05-13: 1 mg via INTRAVENOUS
  Filled 2019-05-13: qty 2

## 2019-05-13 MED ORDER — EPHEDRINE SULFATE-NACL 50-0.9 MG/10ML-% IV SOSY
PREFILLED_SYRINGE | INTRAVENOUS | Status: DC | PRN
  Administered 2019-05-13: 5 mg via INTRAVENOUS

## 2019-05-13 MED ORDER — ONDANSETRON HCL 4 MG/2ML IJ SOLN
INTRAMUSCULAR | Status: DC | PRN
  Administered 2019-05-13: 4 mg via INTRAVENOUS

## 2019-05-13 MED ORDER — PROPOFOL 10 MG/ML IV BOLUS
INTRAVENOUS | Status: DC | PRN
  Administered 2019-05-13: 200 mg via INTRAVENOUS

## 2019-05-13 MED ORDER — FENTANYL CITRATE (PF) 100 MCG/2ML IJ SOLN: 25.0000 ug | INTRAMUSCULAR | Status: DC | PRN

## 2019-05-13 MED ORDER — 0.9 % SODIUM CHLORIDE (POUR BTL) OPTIME
TOPICAL | Status: DC | PRN
  Administered 2019-05-13: 1000 mL

## 2019-05-13 MED ORDER — CEFAZOLIN SODIUM-DEXTROSE 2-4 GM/100ML-% IV SOLN
INTRAVENOUS | Status: AC
  Filled 2019-05-13: qty 100

## 2019-05-13 MED ORDER — MIDAZOLAM HCL 2 MG/2ML IJ SOLN
2.0000 mg | Freq: Once | INTRAMUSCULAR | Status: AC
  Administered 2019-05-13: 1 mg via INTRAVENOUS

## 2019-05-13 MED ORDER — ONDANSETRON HCL 4 MG/2ML IJ SOLN
INTRAMUSCULAR | Status: AC
  Filled 2019-05-13: qty 2

## 2019-05-13 MED ORDER — ACETAMINOPHEN 500 MG PO TABS
ORAL_TABLET | ORAL | Status: AC
  Administered 2019-05-13: 1000 mg via ORAL
  Filled 2019-05-13: qty 2

## 2019-05-13 MED ORDER — LIDOCAINE 2% (20 MG/ML) 5 ML SYRINGE
INTRAMUSCULAR | Status: DC | PRN
  Administered 2019-05-13: 100 mg via INTRAVENOUS

## 2019-05-13 MED ORDER — LIDOCAINE 2% (20 MG/ML) 5 ML SYRINGE
INTRAMUSCULAR | Status: AC
  Filled 2019-05-13: qty 5

## 2019-05-13 MED ORDER — BUPIVACAINE-EPINEPHRINE (PF) 0.5% -1:200000 IJ SOLN
INTRAMUSCULAR | Status: DC | PRN
  Administered 2019-05-13: 30 mL via PERINEURAL

## 2019-05-13 MED ORDER — PHENYLEPHRINE 40 MCG/ML (10ML) SYRINGE FOR IV PUSH (FOR BLOOD PRESSURE SUPPORT)
PREFILLED_SYRINGE | INTRAVENOUS | Status: AC
  Filled 2019-05-13: qty 10

## 2019-05-13 MED ORDER — ROCURONIUM BROMIDE 10 MG/ML (PF) SYRINGE
PREFILLED_SYRINGE | INTRAVENOUS | Status: AC
  Filled 2019-05-13: qty 10

## 2019-05-13 MED ORDER — DEXAMETHASONE SODIUM PHOSPHATE 10 MG/ML IJ SOLN
INTRAMUSCULAR | Status: AC
  Filled 2019-05-13: qty 1

## 2019-05-13 MED ORDER — CHLORHEXIDINE GLUCONATE CLOTH 2 % EX PADS: 6.0000 | MEDICATED_PAD | Freq: Once | CUTANEOUS | Status: DC

## 2019-05-13 MED ORDER — ACETAMINOPHEN 10 MG/ML IV SOLN: 1000.0000 mg | Freq: Once | INTRAVENOUS | Status: DC | PRN

## 2019-05-13 MED ORDER — GABAPENTIN 300 MG PO CAPS
300.0000 mg | ORAL_CAPSULE | ORAL | Status: AC
  Administered 2019-05-13: 300 mg via ORAL

## 2019-05-13 SURGICAL SUPPLY — 34 items
ADH SKN CLS APL DERMABOND .7 (GAUZE/BANDAGES/DRESSINGS) ×1
BLADE CLIPPER SURG (BLADE) IMPLANT
CHLORAPREP W/TINT 26ML (MISCELLANEOUS) ×2 IMPLANT
COVER SURGICAL LIGHT HANDLE (MISCELLANEOUS) ×2 IMPLANT
COVER WAND RF STERILE (DRAPES) IMPLANT
DERMABOND ADVANCED (GAUZE/BANDAGES/DRESSINGS) ×1
DERMABOND ADVANCED .7 DNX12 (GAUZE/BANDAGES/DRESSINGS) ×1 IMPLANT
DRAIN PENROSE 1/2X12 LTX STRL (WOUND CARE) IMPLANT
DRAPE LAPAROTOMY TRNSV 102X78 (DRAPE) ×2 IMPLANT
ELECT REM PT RETURN 9FT ADLT (ELECTROSURGICAL) ×2
ELECTRODE REM PT RTRN 9FT ADLT (ELECTROSURGICAL) ×1 IMPLANT
GLOVE SURG SIGNA 7.5 PF LTX (GLOVE) ×2 IMPLANT
GOWN STRL REUS W/ TWL LRG LVL3 (GOWN DISPOSABLE) ×1 IMPLANT
GOWN STRL REUS W/ TWL XL LVL3 (GOWN DISPOSABLE) ×1 IMPLANT
GOWN STRL REUS W/TWL LRG LVL3 (GOWN DISPOSABLE) ×6
GOWN STRL REUS W/TWL XL LVL3 (GOWN DISPOSABLE) ×2
KIT BASIN OR (CUSTOM PROCEDURE TRAY) ×2 IMPLANT
KIT TURNOVER KIT B (KITS) ×2 IMPLANT
MESH PARIETEX PROGRIP RIGHT (Mesh General) ×1 IMPLANT
NDL HYPO 25GX1X1/2 BEV (NEEDLE) ×1 IMPLANT
NEEDLE HYPO 25GX1X1/2 BEV (NEEDLE) ×2 IMPLANT
NS IRRIG 1000ML POUR BTL (IV SOLUTION) ×2 IMPLANT
PACK GENERAL/GYN (CUSTOM PROCEDURE TRAY) ×2 IMPLANT
PAD ARMBOARD 7.5X6 YLW CONV (MISCELLANEOUS) ×2 IMPLANT
PENCIL SMOKE EVACUATOR (MISCELLANEOUS) ×2 IMPLANT
SUT MON AB 4-0 PC3 18 (SUTURE) ×2 IMPLANT
SUT SILK 2 0 SH (SUTURE) ×1 IMPLANT
SUT VIC AB 2-0 CT1 27 (SUTURE) ×4
SUT VIC AB 2-0 CT1 TAPERPNT 27 (SUTURE) ×1 IMPLANT
SUT VIC AB 3-0 CT1 27 (SUTURE) ×2
SUT VIC AB 3-0 CT1 TAPERPNT 27 (SUTURE) ×1 IMPLANT
SYR CONTROL 10ML LL (SYRINGE) ×2 IMPLANT
TOWEL OR 17X24 6PK STRL BLUE (TOWEL DISPOSABLE) ×2 IMPLANT
TOWEL OR 17X26 10 PK STRL BLUE (TOWEL DISPOSABLE) ×1 IMPLANT

## 2019-05-13 NOTE — Anesthesia Procedure Notes (Signed)
Procedure Name: LMA Insertion Date/Time: 05/13/2019 3:13 PM Performed by: Leonor Liv, CRNA Pre-anesthesia Checklist: Patient identified, Emergency Drugs available, Suction available and Patient being monitored Patient Re-evaluated:Patient Re-evaluated prior to induction Oxygen Delivery Method: Circle System Utilized Preoxygenation: Pre-oxygenation with 100% oxygen Induction Type: IV induction LMA: LMA inserted LMA Size: 5.0 Number of attempts: 1 Placement Confirmation: positive ETCO2 Tube secured with: Tape Dental Injury: Teeth and Oropharynx as per pre-operative assessment

## 2019-05-13 NOTE — H&P (Signed)
Douglas Russell  Location: [facility] Patient #: [id number] DOB: 1951-10-10 Divorced / Language: English / Race: Black or African American Male   History of Present Illness The patient is a 68 year old male who presents with an inguinal hernia. I saw this gentleman in the emergency department several weeks ago with an incarcerated right inguinal hernia. It was actually able to be reduced prior to me seeing him at that point he was nontender and had no objective symptoms so decision was made to follow this up as an outpatient. Since then, he reports minimal discomfort area he is still able to easily reduce the hernia when it does protrude from time to time. He has almost no discomfort.  Pre op COVID test is negative   Past Surgical History  Colon Polyp Removal - Colonoscopy  Hip Surgery  Bilateral. Oral Surgery  Spinal Surgery - Lower Back  Ventral / Umbilical Hernia Surgery  Left.  Diagnostic Studies History  Colonoscopy  1-5 years ago  Allergies  traZODone HCl *ANTIDEPRESSANTS*   Medication History Malachi Bonds, CMA; 03/14/2019 10:19 AM) Albuterol Sulfate HFA (108 (90 Base)MCG/ACT Aerosol Soln, Inhalation) Active. Tamsulosin HCl (0.4MG  Capsule, Oral) Active. Cetirizine HCl (10MG  Tablet, Oral) Active. Garlic (1000MG  Capsule, Oral) Active. Omega 3-6-9 (Oral) Active. Cinnamon (500MG  Capsule, Oral) Active. Centrum Silver (Oral) Active. Tumersaid (Oral) Active. Meloxicam (15MG  Tablet, Oral) Active. Simbrinza (1-0.2% Suspension, Ophthalmic) Active. Lumigan (0.01% Solution, Ophthalmic) Active. Medications Reconciled  Social History   Alcohol use  Occasional alcohol use. Caffeine use  Coffee, Tea. No drug use  Tobacco use  Never smoker.  Family History  Arthritis  Father, Mother. Diabetes Mellitus  Father. Hypertension  Mother, Sister. Respiratory Condition  Mother.  Other Problems Malachi Bonds, CMA; 03/14/2019 10:17  AM) Arthritis  Asthma  Back Pain  Bladder Problems  Chronic Obstructive Lung Disease  Inguinal Hernia  Umbilical Hernia Repair     Review of Systems  General Not Present- Appetite Loss, Chills, Fatigue, Fever, Night Sweats, Weight Gain and Weight Loss. Skin Not Present- Change in Wart/Mole, Dryness, Hives, Jaundice, New Lesions, Non-Healing Wounds, Rash and Ulcer. HEENT Present- Seasonal Allergies and Wears glasses/contact lenses. Not Present- Earache, Hearing Loss, Hoarseness, Nose Bleed, Oral Ulcers, Ringing in the Ears, Sinus Pain, Sore Throat, Visual Disturbances and Yellow Eyes. Respiratory Present- Wheezing. Not Present- Bloody sputum, Chronic Cough, Difficulty Breathing and Snoring. Breast Not Present- Breast Mass, Breast Pain, Nipple Discharge and Skin Changes. Cardiovascular Present- Leg Cramps and Shortness of Breath. Not Present- Chest Pain, Difficulty Breathing Lying Down, Palpitations, Rapid Heart Rate and Swelling of Extremities. Gastrointestinal Not Present- Abdominal Pain, Bloating, Bloody Stool, Change in Bowel Habits, Chronic diarrhea, Constipation, Difficulty Swallowing, Excessive gas, Gets full quickly at meals, Hemorrhoids, Indigestion, Nausea, Rectal Pain and Vomiting. Male Genitourinary Present- Frequency and Nocturia. Not Present- Blood in Urine, Change in Urinary Stream, Impotence, Painful Urination, Urgency and Urine Leakage. Musculoskeletal Present- Back Pain, Joint Pain and Joint Stiffness. Not Present- Muscle Pain, Muscle Weakness and Swelling of Extremities. Neurological Present- Numbness and Tingling. Not Present- Decreased Memory, Fainting, Headaches, Seizures, Tremor, Trouble walking and Weakness. Psychiatric Present- Change in Sleep Pattern. Not Present- Anxiety, Bipolar, Depression, Fearful and Frequent crying. Endocrine Not Present- Cold Intolerance, Excessive Hunger, Hair Changes, Heat Intolerance, Hot flashes and New Diabetes. Hematology Not  Present- Blood Thinners, Easy Bruising, Excessive bleeding, Gland problems, HIV and Persistent Infections.  Vitals   Weight: 186.6 lb Height: 72in Body Surface Area: 2.07 m Body Mass Index: 25.31 kg/m  Pulse: 75 (Regular)  BP: 140/80 (Sitting, Left Arm, Standard)       Physical Exam   The physical exam findings are as follows: Note:Again, on examination he has an easily reducible small right inguinal hernia which is nontender today. There is no evidence of left inguinal hernia Lungs clear CV RRR Skin without rash Awake and alert Abdomen otherwise soft    Assessment & Plan  RIGHT INGUINAL HERNIA (K40.90)  Impression: I again discussed the diagnosis with him. Repair of the hernia with mesh is recommended. We discussed the reasons for the surgery in detail. I discussed use of mesh. I discussed the surgical procedure in detail. I discussed the risks that include but are not limited to bleeding, infection, injury to surrounding structures, nerve entrapment, chronic pain, hernia recurrence, use of mesh, postoperative recovery, etc. He understands and wished to proceed with surgery.

## 2019-05-13 NOTE — Discharge Instructions (Addendum)
[facility] _______[facility] ° °UMBILICAL OR INGUINAL HERNIA REPAIR: POST OP INSTRUCTIONS ° °Always review your discharge instruction sheet given to you by the facility where your surgery was performed. °IF YOU HAVE DISABILITY OR FAMILY LEAVE FORMS, YOU MUST BRING THEM TO THE OFFICE FOR PROCESSING.   °DO NOT GIVE THEM TO YOUR DOCTOR. ° °1. A  prescription for pain medication may be given to you upon discharge.  Take your pain medication as prescribed, if needed.  If narcotic pain medicine is not needed, then you may take acetaminophen (Tylenol) or ibuprofen (Advil) as needed. °2. Take your usually prescribed medications unless otherwise directed. °If you need a refill on your pain medication, please contact your pharmacy.  They will contact our office to request authorization. Prescriptions will not be filled after 5 pm or on week-ends. °3. You should follow a light diet the first 24 hours after arrival home, such as soup and crackers, etc.  Be sure to include lots of fluids daily.  Resume your normal diet the day after surgery. °4.Most patients will experience some swelling and bruising around the umbilicus or in the groin and scrotum.  Ice packs and reclining will help.  Swelling and bruising can take several days to resolve.  °6. It is common to experience some constipation if taking pain medication after surgery.  Increasing fluid intake and taking a stool softener (such as Colace) will usually help or prevent this problem from occurring.  A mild laxative (Milk of Magnesia or Miralax) should be taken according to package directions if there are no bowel movements after 48 hours. °7. Unless discharge instructions indicate otherwise, you may remove your bandages 24-48 hours after surgery, and you may shower at that time.  You may have steri-strips (small skin tapes) in place directly over the incision.  These strips should be left on the skin for 7-10 days.  If your surgeon used skin glue on the  incision, you may shower in 24 hours.  The glue will flake off over the next 2-3 weeks.  Any sutures or staples will be removed at the office during your follow-up visit. °8. ACTIVITIES:  You may resume regular (light) daily activities beginning the next day--such as daily self-care, walking, climbing stairs--gradually increasing activities as tolerated.  You may have sexual intercourse when it is comfortable.  Refrain from any heavy lifting or straining until approved by your doctor. ° °a.You may drive when you are no longer taking prescription pain medication, you can comfortably wear a seatbelt, and you can safely maneuver your car and apply brakes. °b.RETURN TO WORK:   °_____________________________________________ ° °9.You should see your doctor in the office for a follow-up appointment approximately 2-3 weeks after your surgery.  Make sure that you call for this appointment within a day or two after you arrive home to insure a convenient appointment time. °10.OTHER INSTRUCTIONS: __OK TO SHOWER STARTING TOMORROW °ICE PACK, TYLENOL, IBUPROFEN ALSO FOR PAIN °NO LIFTING MORE THAN 15 TO 20 POUNDS FOR 4 WEEKS_______________________ °   _____________________________________ ° °WHEN TO CALL YOUR DOCTOR: °1. Fever over 101.0 °2. Inability to urinate °3. Nausea and/or vomiting °4. Extreme swelling or bruising °5. Continued bleeding from incision. °6. Increased pain, redness, or drainage from the incision ° °The clinic staff is available to answer your questions during regular business hours.  Please don’t hesitate to call and ask to speak to one of the nurses for clinical concerns.  If you have a medical emergency, go to the   the nearest emergency room or call 911.  A surgeon from [facility] is always on call at the hospital   [address], [address], [place], [place]  [address] ?  [address] [address], , [place]   [address] [phone] ? [phone] ? FAX [phone] Web site:  [email or web address] 

## 2019-05-13 NOTE — Transfer of Care (Signed)
Immediate Anesthesia Transfer of Care Note  Patient: Douglas Russell  Procedure(s) Performed: RIGHT INGUINAL HERNIA REPAIR WITH MESH (Right Inguinal)  Patient Location: PACU  Anesthesia Type:General  Level of Consciousness: drowsy  Airway & Oxygen Therapy: Patient Spontanous Breathing and Patient connected to nasal cannula oxygen  Post-op Assessment: Report given to RN  Post vital signs: Reviewed and stable  Last Vitals:  Vitals Value Taken Time  BP 151/90 05/13/2019  3:59 PM  Temp    Pulse 63 05/13/2019  4:01 PM  Resp 13 05/13/2019  4:01 PM  SpO2 100 % 05/13/2019  4:01 PM  Vitals shown include unvalidated device data.  Last Pain:  Vitals:   05/13/19 1500  TempSrc:   PainSc: 0-No pain      Patients Stated Pain Goal: 3 (09/38/18 2993)  Complications: No apparent anesthesia complications

## 2019-05-13 NOTE — Anesthesia Procedure Notes (Signed)
Anesthesia Regional Block: TAP block   Pre-Anesthetic Checklist: ,, timeout performed, Correct Patient, Correct Site, Correct Laterality, Correct Procedure, Correct Position, site marked, Risks and benefits discussed, pre-op evaluation,  At surgeon's request and post-op pain management  Laterality: Right  Prep: Maximum Sterile Barrier Precautions used, chloraprep       Needles:  Injection technique: Single-shot  Needle Type: Echogenic Stimulator Needle     Needle Length: 9cm  Needle Gauge: 21     Additional Needles:   Narrative:  Start time: 05/13/2019 2:45 PM End time: 05/13/2019 2:48 PM Injection made incrementally with aspirations every 5 mL. Anesthesiologist: [name], MD  Additional Notes: Risks, benefits, and alternative discussed. Patient gave consent for procedure. Patient prepped and draped in sterile fashion. Sedation administered, patient remains easily responsive to voice. Relevant anatomy identified with ultrasound guidance. Local anesthetic given in 5cc increments with no signs or symptoms of intravascular injection. No pain or paraesthesias with injection. Patient monitored throughout procedure with signs of LAST or immediate complications. Tolerated well. Ultrasound image placed in chart. Tawny Asal, MD

## 2019-05-13 NOTE — Op Note (Signed)
RIGHT INGUINAL HERNIA REPAIR WITH MESH  Procedure Note  ZEUS MARQUIS 05/13/2019   Pre-op Diagnosis: right inguinal hernia     Post-op Diagnosis: same  Procedure(s): RIGHT INGUINAL HERNIA REPAIR WITH MESH  Surgeon(s): Coralie Keens, MD  Anesthesia: General  Staff:  Circulator: Hal Morales, RN; Jeanie Cooks, RN Relief Scrub: Jasso, Adriana Scrub Person: Rometta Emery, RN; Rolan Bucco  Estimated Blood Loss: Minimal               Procedure: Patient brought to the operating room and identifies correct patient.  He was placed upon the operating table general anesthesia was induced.  His abdomen was then prepped and draped in the usual sterile fashion.  I anesthetized the skin in the right inguinal area with Marcaine.  I then made a longitudinal incision with a scalpel.  I dissected down through Scarpa's fascia with the cautery.  I then opened the external oblique fascia toward the internal and external rings.  The testicular cord and structures were controlled with a Penrose drain.  The patient had an indirect hernia sac.  I separated from the cord structures.  I then imbricated the sac and reduced it and closed the internal ring with a 2-0 silk suture.  2 bleeding areas on the testicular cord were controlled with silk sutures as well.  Next a piece of pro-grip Prolene mesh from eating was brought onto the field.  I placed that is not only against the pubic tubercle and then brought around the cord structures and all through the inguinal floor.  I secured in place with several 2-0 Vicryl sutures.  Wide coverage of the internal ring and angle for appeared to be achieved.  I then closed the external lead fascia over the top of the the mesh with a running 2-0 Vicryl suture.  I anesthetized the fascia further with Marcaine.  I closed office fascia with interrupted 3-0 Vicryl sutures and closed the skin with a running 4-0 Monocryl.  Dermabond was then applied.  Patient  tolerated procedure well.  All counts were correct at the end of the procedure.  The patient was then extubated in the operating room and taken in stable addition to the recovery room.          Coralie Keens   Date: 05/13/2019  Time: 3:55 PM

## 2019-05-16 ENCOUNTER — Encounter (HOSPITAL_COMMUNITY): Payer: Self-pay | Admitting: Surgery

## 2019-05-16 NOTE — Anesthesia Postprocedure Evaluation (Signed)
Anesthesia Post Note  Patient: Douglas Russell  Procedure(s) Performed: RIGHT INGUINAL HERNIA REPAIR WITH MESH (Right Inguinal)     Patient location during evaluation: PACU Anesthesia Type: General Level of consciousness: awake and alert Pain management: pain level controlled Vital Signs Assessment: post-procedure vital signs reviewed and stable Respiratory status: spontaneous breathing, nonlabored ventilation and respiratory function stable Cardiovascular status: blood pressure returned to baseline and stable Postop Assessment: no apparent nausea or vomiting Anesthetic complications: no    Last Vitals:  Vitals:   05/13/19 1650 05/13/19 1705  BP: (!) 149/91 (!) 146/94  Pulse: (!) 57 60  Resp: 16 13  Temp:    SpO2: 100% 100%    Last Pain:  Vitals:   05/13/19 1645  TempSrc:   PainSc: 0-No pain   Pain Goal: Patients Stated Pain Goal: 3 (05/13/19 1306)                 [name]

## 2019-05-17 ENCOUNTER — Telehealth: Payer: Self-pay | Admitting: Family Medicine

## 2019-05-17 NOTE — Telephone Encounter (Signed)
Pt

## 2019-07-11 ENCOUNTER — Ambulatory Visit: Payer: Medicare HMO | Admitting: Family Medicine

## 2019-07-12 ENCOUNTER — Ambulatory Visit: Payer: Medicare HMO | Admitting: Family Medicine

## 2019-07-12 ENCOUNTER — Ambulatory Visit: Payer: Medicare HMO

## 2019-07-13 ENCOUNTER — Encounter: Payer: Self-pay | Admitting: Family Medicine

## 2019-07-13 ENCOUNTER — Other Ambulatory Visit: Payer: Self-pay

## 2019-07-13 ENCOUNTER — Ambulatory Visit (INDEPENDENT_AMBULATORY_CARE_PROVIDER_SITE_OTHER): Payer: Medicare HMO | Admitting: Family Medicine

## 2019-07-13 VITALS — BP 128/76 | HR 71 | Temp 98.7°F | Resp 14 | Wt 183.4 lb

## 2019-07-13 VITALS — BP 128/76 | HR 71 | Temp 98.7°F | Resp 18 | Ht 71.65 in | Wt 183.0 lb

## 2019-07-13 DIAGNOSIS — Z125 Encounter for screening for malignant neoplasm of prostate: Secondary | ICD-10-CM | POA: Diagnosis not present

## 2019-07-13 DIAGNOSIS — J309 Allergic rhinitis, unspecified: Secondary | ICD-10-CM | POA: Diagnosis not present

## 2019-07-13 DIAGNOSIS — E785 Hyperlipidemia, unspecified: Secondary | ICD-10-CM

## 2019-07-13 DIAGNOSIS — Z0000 Encounter for general adult medical examination without abnormal findings: Secondary | ICD-10-CM

## 2019-07-13 DIAGNOSIS — R351 Nocturia: Secondary | ICD-10-CM

## 2019-07-13 DIAGNOSIS — J4520 Mild intermittent asthma, uncomplicated: Secondary | ICD-10-CM

## 2019-07-13 DIAGNOSIS — N400 Enlarged prostate without lower urinary tract symptoms: Secondary | ICD-10-CM

## 2019-07-13 MED ORDER — TAMSULOSIN HCL 0.4 MG PO CAPS: 0.4000 mg | ORAL_CAPSULE | Freq: Every day | ORAL | 3 refills | Status: DC

## 2019-07-13 MED ORDER — FLOVENT HFA 44 MCG/ACT IN AERO: 2.0000 | INHALATION_SPRAY | Freq: Two times a day (BID) | RESPIRATORY_TRACT | 2 refills | Status: DC

## 2019-07-13 MED ORDER — CETIRIZINE HCL 10 MG PO TABS: 10.0000 mg | ORAL_TABLET | Freq: Every day | ORAL | 2 refills | Status: DC

## 2019-07-13 MED ORDER — ALBUTEROL SULFATE HFA 108 (90 BASE) MCG/ACT IN AERS: 1.0000 | INHALATION_SPRAY | Freq: Four times a day (QID) | RESPIRATORY_TRACT | 1 refills | Status: DC | PRN

## 2019-07-13 NOTE — Progress Notes (Signed)
Presents today for [employer] Visit   Date of last exam: 07/13/2019  Interpreter used for this visit? No  Patient was seen in clinic  Patient Care Team: Wendie Agreste, MD as PCP - General (Family Medicine)   Other items to address today:  Discussed Eye Discussed immunizations   : ADVANCE DIRECTIVES: Discussed:  yes On File: no Materials Provided:yes  Immunization status:  Immunization History  Administered Date(s) Administered  . Influenza Split 09/28/2012  . Influenza, Seasonal, Injecte, Preservative Fre 09/23/2013  . Influenza,inj,Quad PF,6+ Mos 09/08/2014, 09/20/2015  . Influenza-Unspecified 08/31/2017  . Pneumococcal Conjugate-13 09/20/2015  . Pneumococcal Polysaccharide-23 09/23/2013, 11/01/2018  . Tdap 09/23/2013  . Zoster 05/29/2010  . Zoster Recombinat (Shingrix) 08/31/2017     There are no preventive care reminders to display for this patient.   Functional Status Survey: Does the patient have difficulty seeing, even when wearing glasses/contacts?: No Does the patient have difficulty concentrating, remembering, or making decisions?: No Does the patient have difficulty walking or climbing stairs?: No Does the patient have difficulty dressing or bathing?: No Does the patient have difficulty doing errands alone such as visiting a doctor's office or shopping?: No   6CIT Screen 07/13/2019  What Year? 0 points  What month? 0 points  What time? 0 points  Count back from 20 0 points  Months in reverse 0 points  Repeat phrase 0 points  Total Score 0        Clinical Support from 07/13/2019 in Primary Care at [facility]  AUDIT-C Score  1       Home Environment:   Pt seen face to face in clinic.  Lives in single story, no rugs.  No grab bars in shower.  No stairs.  Discussed immunizations, UTD.  Health Maint UTD. Able to rise out of chair within 30 seconds.      Patient Active Problem List   Diagnosis Date Noted  . Hx of  adenomatous polyp of colon 01/25/2015  . Status post left hip replacement 10/13/2014  . Asthma, chronic 10/13/2014  . Osteoarthritis of left hip 10/10/2014  . Hip arthritis 10/10/2014  . History of asthma 08/08/2014  . History of umbilical hernia 50/53/9767  . History of pelvic fracture 08/08/2014     Past Medical History:  Diagnosis Date  . Arthritis    L&R hip & pelvis   . Asthma    has had since he was a child, states he only uses the inhaler PRN  . Cataract   . Family history of adverse reaction to anesthesia    Pt. not exactly sure-sister"they almost lost her"during  a surgery  . Glaucoma   . Glaucoma    both eyes  . History of blood transfusion    /w pelvic fx.   Marland Kitchen Hx of adenomatous polyp of colon 01/25/2015  . Osteoarthritis of left hip 10/10/2014  . Pelvic fracture ([place]) 09/28/1994  . Pneumonia    in hosp.,[facility] ( /w pelvic fx)-      Past Surgical History:  Procedure Laterality Date  . back cyst  2006   surgery x4 for cyst on the back   . EYE SURGERY Bilateral March 2016   Dr. Marylynn Pearson, [place]  . FRACTURE SURGERY Right 1995   hip  . HERNIA REPAIR  3419   umbilical   . INGUINAL HERNIA REPAIR Right 05/13/2019   Procedure: RIGHT INGUINAL HERNIA REPAIR WITH MESH;  Surgeon: Coralie Keens, MD;  Location: [place];  Service: General;  Laterality: Right;  AND TAP BLOCK  . PELVIC FRACTURE SURGERY  1995  . TOTAL HIP ARTHROPLASTY Left 10/10/2014   dr Mardelle Matte  . TOTAL HIP ARTHROPLASTY Left 10/10/2014   Procedure: TOTAL HIP ARTHROPLASTY;  Surgeon: Johnny Bridge, MD;  Location: [place];  Service: Orthopedics;  Laterality: Left;     Family History  Problem Relation Age of Onset  . Hypertension Mother   . Diabetes Father   . Hypertension Sister   . Cancer Maternal Grandmother   . Hypertension Sister   . Colon cancer Neg Hx      Social History   Socioeconomic History  . Marital status: Single    Spouse name: Not on file  . Number of children: 1  . Years of  education: Not on file  . Highest education level: Not on file  Occupational History  . Occupation: distribution    Fish farm manager: [employer]  . Financial resource strain: Not hard at all  . Food insecurity    Worry: Never true    Inability: Never true  . Transportation needs    Medical: No    Non-medical: No  Tobacco Use  . Smoking status: Never Smoker  . Smokeless tobacco: Never Used  Substance and Sexual Activity  . Alcohol use: Yes    Alcohol/week: 0.0 standard drinks    Comment: occasional glass of wine  . Drug use: No  . Sexual activity: Yes  Lifestyle  . Physical activity    Days per week: 0 days    Minutes per session: Not on file  . Stress: Not at all  Relationships  . Social Herbalist on phone: Once a week    Gets together: Once a week    Attends religious service: 1 to 4 times per year    Active member of club or organization: Yes    Attends meetings of clubs or organizations: 1 to 4 times per year    Relationship status: Divorced  . Intimate partner violence    Fear of current or ex partner: No    Emotionally abused: No    Physically abused: No    Forced sexual activity: No  Other Topics Concern  . Not on file  Social History Narrative  . Not on file     Allergies  Allergen Reactions  . Shellfish Allergy Anaphylaxis and Swelling    Throat swells closed," like there is a block of cement in my throat"   . Trazodone And Nefazodone     Jittery / shaking      Prior to Admission medications   Medication Sig Start Date End Date Taking? Authorizing Provider  albuterol (PROVENTIL HFA;VENTOLIN HFA) 108 (90 Base) MCG/ACT inhaler Inhale 2 puffs into the lungs every 6 (six) hours as needed for wheezing or shortness of breath. Ok to fill with preferred pro air Patient not taking: Reported on 07/13/2019 01/07/19   Wendie Agreste, MD  bimatoprost (LUMIGAN) 0.01 % SOLN Place 1 drop into both eyes at bedtime.     [provider]  cetirizine (ZYRTEC) 10 MG tablet Take 1 tablet (10 mg total) by mouth daily. 05/10/19   Wendie Agreste, MD  CINNAMON PO Take 1,000 mg by mouth daily.     [provider]  Flaxseed, Linseed, (FLAXSEED OIL) OIL Take 2,400 mg by mouth.    [provider]  fluticasone (FLOVENT HFA) 44 MCG/ACT inhaler Inhale 2 puffs into  the lungs 2 (two) times daily. 01/07/19   Wendie Agreste, MD  Garlic 1155 MG CAPS Take 1,000 mg by mouth daily.     [provider]  Melatonin 1 MG TABS Take 1 mg by mouth at bedtime as needed (for sleep).     [provider]  Multiple Vitamin (MULITIVITAMIN WITH MINERALS) TABS Take 1 tablet by mouth daily.    [provider]  Multiple Vitamins-Minerals (CENTRUM SILVER 50+MEN PO) Take by mouth.    [provider]  Omega 3-6-9 Fatty Acids (OMEGA-3-6-9 PO) Take 2 capsules by mouth daily.    [provider]  OVER THE COUNTER MEDICATION Place 1 Dose under the tongue 2 (two) times a day. CBD oil    [provider]  oxyCODONE (OXY IR/ROXICODONE) 5 MG immediate release tablet Take 1 tablet (5 mg total) by mouth every 6 (six) hours as needed for moderate pain or severe pain. 05/13/19   Coralie Keens, MD  SIMBRINZA 1-0.2 % SUSP Place 1 drop into both eyes 3 (three) times daily.  02/05/19   [provider]  tamsulosin (FLOMAX) 0.4 MG CAPS capsule Take 1 capsule (0.4 mg total) by mouth daily. Patient taking differently: Take 0.4 mg by mouth at bedtime.  03/28/17   Wendie Agreste, MD  Turmeric Curcumin 500 MG CAPS Take 2 capsules by mouth daily.     [provider]     Depression screen [facility] 2/9 07/13/2019 07/13/2019 03/02/2019 03/02/2019 01/07/2019  Decreased Interest 0 0 0 0 0  Down, Depressed, Hopeless 0 0 0 0 0  PHQ - 2 Score 0 0 0 0 0     Fall Risk  07/13/2019 07/13/2019 03/02/2019 03/02/2019 01/07/2019  Falls in the past year? 0 0 0 0 0  Comment - - - - -  Number falls in past yr: 0 0  0 0 -  Injury with Fall? 0 - 0 0 -  Follow up Falls evaluation completed Falls evaluation completed Falls evaluation completed Falls evaluation completed -      PHYSICAL EXAM: BP 128/76 (BP Location: Left Arm, Patient Position: Sitting, Cuff Size: Normal)   Pulse 71   Temp 98.7 F (37.1 C) (Oral)   Resp 18   Ht 5' 11.65" (1.82 m)   Wt 183 lb (83 kg)   SpO2 97%   BMI 25.06 kg/m    Wt Readings from Last 3 Encounters:  07/13/19 183 lb (83 kg)  05/13/19 188 lb 1.6 oz (85.3 kg)  05/11/19 188 lb 1.6 oz (85.3 kg)     No exam data present    Physical Exam   Education/Counseling provided regarding diet and exercise, prevention of chronic diseases, smoking/tobacco cessation, if applicable, and reviewed "Covered Medicare Preventive Services."

## 2019-07-13 NOTE — Patient Instructions (Addendum)
If albuterol is needed more frequently than twice per week, then follow up to discuss other treatment options. Can increase flovent to daily if needed for improved asthma control.  Air purifier may also help.   Keep follow up with urologist. I will renew meds and check PSA today.   Thanks for coming in today, follow-up in 6 months unless any concerns sooner.  Stay safe.      If you have lab work done today you will be contacted with your lab results within the next 2 weeks.  If you have not heard from [place] then please contact us. The fastest way to get your results is to register for My Chart.   IF you received an x-ray today, you will receive an invoice from [facility]. Please contact [facility] at [phone] with questions or concerns regarding your invoice.   IF you received labwork today, you will receive an invoice from [place]. Please contact [facility] at [phone] with questions or concerns regarding your invoice.   Our billing staff will not be able to assist you with questions regarding bills from these companies.  You will be contacted with the lab results as soon as they are available. The fastest way to get your results is to activate your My Chart account. Instructions are located on the last page of this paperwork. If you have not heard from [place] regarding the results in 2 weeks, please contact this office.

## 2019-07-13 NOTE — Progress Notes (Signed)
Subjective:    Patient ID: Douglas Russell, male    DOB: [birth year], 68 y.o.   MRN: [id number]  HPI Douglas Russell is a 68 y.o. male Presents today for: Chief Complaint  Patient presents with  . Asthma    Patient is here for a 6 month f/u on his asthma.  . Medicare Wellness     Look at last office notes it states patient need to be seen today for asthma and wellness.    Wellness exam completed earlier today.  Here for follow-up of asthma.   Asthma: On albuterol as needed. Flovent rx 2 puffs BID, but taking 1-2 times per week.  Last used few days ago. Occasional nighttime symptoms. Uses on average 1-3 times per week. No recent worsening symptoms. Stable.  On zyrtec QD for allergies - working ok for PND/allergies.    BPH: Started on Flomax in 2017 by Dr. Linna Russell for suspected enlarged prostate. Taking flomax nightly. Less nocturia. 2 per night now - prior 4 times per night. Normal flow, no hesitancy. No hematuria.  Urologist - Dr. Alyson Russell, appt next month.   Lab Results  Component Value Date   LDLDIRECT 113 (H) 05/11/2015  No recent lipid screening. Mild hyperglycemia in the past, normal on recent testing.  Glaucoma: optho - [facility], appt next month. on Lumigan QHS, Simbrinza TID.       Patient Active Problem List   Diagnosis Date Noted  . Hx of adenomatous polyp of colon 01/25/2015  . Status post left hip replacement 10/13/2014  . Asthma, chronic 10/13/2014  . Osteoarthritis of left hip 10/10/2014  . Hip arthritis 10/10/2014  . History of asthma 08/08/2014  . History of umbilical hernia 19/62/2297  . History of pelvic fracture 08/08/2014   Past Medical History:  Diagnosis Date  . Arthritis    L&R hip & pelvis   . Asthma    has had since he was a child, states he only uses the inhaler PRN  . Cataract   . Family history of adverse reaction to anesthesia    Pt. not exactly sure-sister"they almost lost her"during  a surgery  . Glaucoma   . Glaucoma     both eyes  . History of blood transfusion    /w pelvic fx.   Marland Kitchen Hx of adenomatous polyp of colon 01/25/2015  . Osteoarthritis of left hip 10/10/2014  . Pelvic fracture ([place]) 09/28/1994  . Pneumonia    in hosp.,[facility] ( /w pelvic fx)-    Past Surgical History:  Procedure Laterality Date  . back cyst  2006   surgery x4 for cyst on the back   . EYE SURGERY Bilateral March 2016   Dr. Marylynn [facility], [place] [facility]  . FRACTURE SURGERY Right 1995   hip  . HERNIA REPAIR  9892   umbilical   . INGUINAL HERNIA REPAIR Right 05/13/2019   Procedure: RIGHT INGUINAL HERNIA REPAIR WITH MESH;  Surgeon: Douglas Keens, MD;  Location: [facility];  Service: General;  Laterality: Right;  AND TAP BLOCK  . PELVIC FRACTURE SURGERY  1995  . TOTAL HIP ARTHROPLASTY Left 10/10/2014   dr [facility]  . TOTAL HIP ARTHROPLASTY Left 10/10/2014   Procedure: TOTAL HIP ARTHROPLASTY;  Surgeon: Douglas Bridge, MD;  Location: [place];  Service: Orthopedics;  Laterality: Left;   Allergies  Allergen Reactions  . Shellfish Allergy Anaphylaxis and Swelling    Throat swells closed," like there is a block of cement in my throat"   . Trazodone  And Nefazodone     Jittery / shaking    Prior to Admission medications   Medication Sig Start Date End Date Taking? Authorizing Provider  bimatoprost (LUMIGAN) 0.01 % SOLN Place 1 drop into both eyes at bedtime.    Yes [provider]  cetirizine (ZYRTEC) 10 MG tablet Take 1 tablet (10 mg total) by mouth daily. 05/10/19  Yes Douglas Agreste, MD  CINNAMON Russell Take 1,000 mg by mouth daily.    Yes [provider]  Flaxseed, Linseed, (FLAXSEED OIL) OIL Take 2,400 mg by mouth.   Yes [provider]  fluticasone (FLOVENT HFA) 44 MCG/ACT inhaler Inhale 2 puffs into the lungs 2 (two) times daily. 01/07/19  Yes Douglas Agreste, MD  Garlic 9741 MG CAPS Take 1,000 mg by mouth daily.    Yes [provider]  Melatonin 1 MG TABS Take 1 mg by mouth at bedtime as needed (for sleep).     Yes [provider]  Multiple Vitamin ([facility] Russell) TABS Take 1 tablet by mouth daily.   Yes [provider]  Multiple Vitamins-Russell ([facility]las Russell) Take by mouth.   Yes [provider]  Omega 3-6-9 Fatty Acids ([facility] Russell) Take 2 capsules by mouth daily.   Yes [provider]  OVER THE COUNTER MEDICATION Place 1 Dose under the tongue 2 (two) times a day. CBD oil   Yes [provider]  oxyCODONE (OXY IR/ROXICODONE) 5 MG immediate release tablet Take 1 tablet (5 mg total) by mouth every 6 (six) hours as needed for moderate pain or severe pain. 05/13/19  Yes Douglas Keens, MD  SIMBRINZA 1-0.2 % SUSP Place 1 drop into both eyes 3 (three) times daily.  02/05/19  Yes [provider]  tamsulosin (FLOMAX) 0.4 MG CAPS capsule Take 1 capsule (0.4 mg total) by mouth daily. Patient taking differently: Take 0.4 mg by mouth at bedtime.  03/28/17  Yes Douglas Agreste, MD  Turmeric Curcumin 500 MG CAPS Take 2 capsules by mouth daily.    Yes [provider]  albuterol (PROVENTIL HFA;VENTOLIN HFA) 108 (90 Base) MCG/ACT inhaler Inhale 2 puffs into the lungs every 6 (six) hours as needed for wheezing or shortness of breath. Ok to fill with preferred pro air Patient not taking: Reported on 07/13/2019 01/07/19   Douglas Agreste, MD   Social History   Socioeconomic History  . Marital status: Single    Spouse name: Not on file  . Number of children: 1  . Years of education: Not on file  . Highest education level: Not on file  Occupational History  . 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 Financial resource strain: Not hard at all  . Food insecurity    Worry: Never true    Inability: Never true  . Transportation needs    Medical: No    Non-medical: No  Tobacco Use  . Smoking status: Never Smoker  . Smokeless tobacco: Never Used  Substance and Sexual Activity  . Alcohol  use: Yes    Alcohol/week: 0.0 standard drinks    Comment: occasional glass of wine  . Drug use: No  . Sexual activity: Yes  Lifestyle  . Physical activity    Days per week: 0 days    Minutes per session: Not on file  . Stress: Not at all  Relationships  . Social connections    Talks on phone: Once a week  Gets together: Once a week    Attends religious service: 1 to 4 times per year    Active member of club or organization: Yes    Attends meetings of clubs or organizations: 1 to 4 times per year    Relationship status: Divorced  . Intimate partner violence    Fear of current or ex partner: No    Emotionally abused: No    Physically abused: No    Forced sexual activity: No  Other Topics Concern  . Not on file  Social History Narrative  . Not on file    Review of Systems Per HPI.     Objective:   Physical Exam Vitals signs reviewed.  Constitutional:      Appearance: He is well-developed.  HENT:     Head: Normocephalic and atraumatic.  Eyes:     Pupils: Pupils are equal, round, and reactive to light.  Neck:     Vascular: No carotid bruit or JVD.  Cardiovascular:     Rate and Rhythm: Normal rate and regular rhythm.     Heart sounds: Normal heart sounds. No murmur.  Pulmonary:     Effort: Pulmonary effort is normal.     Breath sounds: Normal breath sounds. No rales.  Skin:    General: Skin is warm and dry.  Neurological:     Mental Status: He is alert and oriented to person, place, and time.    Vitals:   07/13/19 1034  BP: 128/76  Pulse: 71  Resp: 14  Temp: 98.7 F (37.1 C)  TempSrc: Oral  SpO2: 97%  Weight: 183 lb 6.4 oz (83.2 kg)         Assessment & Plan:   Douglas Russell is a 68 y.o. male Mild intermittent asthma without complication - Plan: albuterol (VENTOLIN HFA) 108 (90 Base) MCG/ACT inhaler, fluticasone (FLOVENT HFA) 44 MCG/ACT inhaler  - stable with intermittent ICS dosing. Option of daily flovent if increased albuterol need.   Hyperlipidemia, unspecified hyperlipidemia type - Plan: Comprehensive metabolic panel, Lipid panel  -Due for updated screening.  Screening for prostate cancer - Plan: PSA Benign prostatic hyperplasia, unspecified whether lower urinary tract symptoms present Nocturia - Plan: tamsulosin (FLOMAX) 0.4 MG CAPS capsule  -Improved symptoms on Flomax, check PSA, follow-up with urologist as planned.  Flomax refilled for now.  Allergic rhinitis, unspecified seasonality, unspecified trigger - Plan: cetirizine (ZYRTEC) 10 MG tablet  -Stable, continue Zyrtec.  Meds ordered this encounter  Medications  . tamsulosin (FLOMAX) 0.4 MG CAPS capsule    Sig: Take 1 capsule (0.4 mg total) by mouth at bedtime.    Dispense:  90 capsule    Refill:  3  . albuterol (VENTOLIN HFA) 108 (90 Base) MCG/ACT inhaler    Sig: Inhale 1-2 puffs into the lungs every 6 (six) hours as needed for wheezing or shortness of breath. Ok to fill with preferred pro air    Dispense:  18 g    Refill:  1  . cetirizine (ZYRTEC) 10 MG tablet    Sig: Take 1 tablet (10 mg total) by mouth daily.    Dispense:  90 tablet    Refill:  2  . fluticasone (FLOVENT HFA) 44 MCG/ACT inhaler    Sig: Inhale 2 puffs into the lungs 2 (two) times daily.    Dispense:  3 Inhaler    Refill:  2   Patient Instructions   If albuterol is needed more frequently than twice per week, then follow up to  discuss other treatment options. Can increase flovent to daily if needed for improved asthma control.  Air purifier may also help.   Keep follow up with urologist. I will renew meds and check PSA today.   Thanks for coming in today, follow-up in 6 months unless any concerns sooner.  Stay safe.      If you have lab work done today you will be contacted with your lab results within the next 2 weeks.  If you have not heard from [place] then please contact us. The fastest way to get your results is to register for My Chart.   IF you received an x-ray today, you  will receive an invoice from [facility]. Please contact [facility] at [phone] with questions or concerns regarding your invoice.   IF you received labwork today, you will receive an invoice from [place]. Please contact [facility] at [phone] with questions or concerns regarding your invoice.   Our billing staff will not be able to assist you with questions regarding bills from these companies.  You will be contacted with the lab results as soon as they are available. The fastest way to get your results is to activate your My Chart account. Instructions are located on the last page of this paperwork. If you have not heard from [place] regarding the results in 2 weeks, please contact this office.       Signed,   Merri Ray, MD [facility] at [place].  07/15/19 9:04 PM

## 2019-07-14 LAB — COMPREHENSIVE METABOLIC PANEL
ALT: 22 IU/L (ref 0–44)
AST: 23 IU/L (ref 0–40)
Albumin/Globulin Ratio: 1.5 (ref 1.2–2.2)
Albumin: 4.1 g/dL (ref 3.8–4.8)
Alkaline Phosphatase: 80 IU/L (ref 39–117)
BUN/Creatinine Ratio: 22 (ref 10–24)
BUN: 19 mg/dL (ref 8–27)
Bilirubin Total: 0.7 mg/dL (ref 0.0–1.2)
CO2: 20 mmol/L (ref 20–29)
Calcium: 9.4 mg/dL (ref 8.6–10.2)
Chloride: 107 mmol/L — ABNORMAL HIGH (ref 96–106)
Creatinine, Ser: 0.87 mg/dL (ref 0.76–1.27)
GFR calc Af Amer: 101 mL/min/{1.73_m2} (ref >59)
GFR calc non Af Amer: 87 mL/min/{1.73_m2} (ref >59)
Globulin, Total: 2.7 g/dL (ref 1.5–4.5)
Glucose: 95 mg/dL (ref 65–99)
Potassium: 4.3 mmol/L (ref 3.5–5.2)
Sodium: 142 mmol/L (ref 134–144)
Total Protein: 6.8 g/dL (ref 6.0–8.5)

## 2019-07-14 LAB — LIPID PANEL
Chol/HDL Ratio: 2.6 ratio (ref 0.0–5.0)
Cholesterol, Total: 213 mg/dL — ABNORMAL HIGH (ref 100–199)
HDL: 82 mg/dL (ref >39)
LDL Calculated: 119 mg/dL — ABNORMAL HIGH (ref 0–99)
Triglycerides: 62 mg/dL (ref 0–149)
VLDL Cholesterol Cal: 12 mg/dL (ref 5–40)

## 2019-07-14 LAB — PSA: Prostate Specific Ag, Serum: 0.6 ng/mL (ref 0.0–4.0)

## 2019-08-05 DIAGNOSIS — N401 Enlarged prostate with lower urinary tract symptoms: Secondary | ICD-10-CM | POA: Diagnosis not present

## 2019-08-05 DIAGNOSIS — R351 Nocturia: Secondary | ICD-10-CM | POA: Diagnosis not present

## 2019-08-16 DIAGNOSIS — H401132 Primary open-angle glaucoma, bilateral, moderate stage: Secondary | ICD-10-CM | POA: Diagnosis not present

## 2019-08-16 DIAGNOSIS — H2513 Age-related nuclear cataract, bilateral: Secondary | ICD-10-CM | POA: Diagnosis not present

## 2019-10-18 DIAGNOSIS — H401132 Primary open-angle glaucoma, bilateral, moderate stage: Secondary | ICD-10-CM | POA: Diagnosis not present

## 2019-10-18 DIAGNOSIS — H2513 Age-related nuclear cataract, bilateral: Secondary | ICD-10-CM | POA: Diagnosis not present

## 2020-01-13 ENCOUNTER — Other Ambulatory Visit: Payer: Self-pay

## 2020-01-13 ENCOUNTER — Ambulatory Visit (INDEPENDENT_AMBULATORY_CARE_PROVIDER_SITE_OTHER): Payer: Medicare HMO | Admitting: Family Medicine

## 2020-01-13 ENCOUNTER — Ambulatory Visit (INDEPENDENT_AMBULATORY_CARE_PROVIDER_SITE_OTHER): Payer: Medicare HMO

## 2020-01-13 ENCOUNTER — Encounter: Payer: Self-pay | Admitting: Family Medicine

## 2020-01-13 VITALS — BP 132/75 | HR 76 | Temp 98.1°F | Ht 71.0 in | Wt 183.8 lb

## 2020-01-13 DIAGNOSIS — E785 Hyperlipidemia, unspecified: Secondary | ICD-10-CM | POA: Diagnosis not present

## 2020-01-13 DIAGNOSIS — R351 Nocturia: Secondary | ICD-10-CM

## 2020-01-13 DIAGNOSIS — N400 Enlarged prostate without lower urinary tract symptoms: Secondary | ICD-10-CM | POA: Diagnosis not present

## 2020-01-13 DIAGNOSIS — M1712 Unilateral primary osteoarthritis, left knee: Secondary | ICD-10-CM | POA: Diagnosis not present

## 2020-01-13 DIAGNOSIS — M25462 Effusion, left knee: Secondary | ICD-10-CM | POA: Diagnosis not present

## 2020-01-13 DIAGNOSIS — J4520 Mild intermittent asthma, uncomplicated: Secondary | ICD-10-CM | POA: Diagnosis not present

## 2020-01-13 DIAGNOSIS — M25562 Pain in left knee: Secondary | ICD-10-CM

## 2020-01-13 MED ORDER — FLOVENT HFA 44 MCG/ACT IN AERO: 2.0000 | INHALATION_SPRAY | Freq: Two times a day (BID) | RESPIRATORY_TRACT | 2 refills | Status: DC

## 2020-01-13 NOTE — Patient Instructions (Addendum)
      If you are having issues with urination/prostate - please call urologist for follow up. Continue flomax for now.   Knee pain should continue to improve, if any fever, redness, increased swelling, be seen right away.  Let me know if that pain returns.  No change in other medications at this time.  I will check some lab work.   Return to the clinic or go to the nearest emergency room if any of your symptoms worsen or new symptoms occur.   If you have lab work done today you will be contacted with your lab results within the next 2 weeks.  If you have not heard from [place] then please contact us. The fastest way to get your results is to register for My Chart.   IF you received an x-ray today, you will receive an invoice from [facility]. Please contact [facility] at [phone] with questions or concerns regarding your invoice.   IF you received labwork today, you will receive an invoice from [place]. Please contact [facility] at [phone] with questions or concerns regarding your invoice.   Our billing staff will not be able to assist you with questions regarding bills from these companies.  You will be contacted with the lab results as soon as they are available. The fastest way to get your results is to activate your My Chart account. Instructions are located on the last page of this paperwork. If you have not heard from [place] regarding the results in 2 weeks, please contact this office.

## 2020-01-13 NOTE — Progress Notes (Signed)
Subjective:  Patient ID: Douglas Russell, male    DOB: 07/11/1951  Age: 69 y.o. MRN: [id number]  CC:  Chief Complaint  Patient presents with   Edema    In pt's L Knee. pt has some discomfort in the L knee as well.   Follow-up    on asthma. no issues with pt's current asthma medication. pt hasn't had any emergencies due to his asthma.     HPI [name] presents for   Left knee pain/swelling: No known injury.  Pain past 1-1.5 months, swelling at times.  No locking/giving way/mechnanical symptoms.  No Rxmeds, no brace just usual herbals, including turmeric without relief.  No prior knee issue. No recent glucosamine/chondroitin.   Asthma Stable last July on intermittent Flovent HFA dosing.  Option of daily use discussed if increased albuterol need. flovent - 2-3 times per week.  Albuterol - rare use - none recently.   BPH: Discussed in July, Flomax refill at that time, follow-up with urologist August 7.  Dr. Alyson Ingles,  note reviewed, continued Flomax 0.4 mg daily.  Still taking flomax, 4 times per night at times, 2 last night.  Lab Results  Component Value Date   PSA1 0.6 07/13/2019   PSA1 0.6 03/28/2017   PSA 0.40 01/30/2016   PSA 0.45 05/11/2015   PSA 0.58 01/11/2013    Hyperlipidemia: Plan for diet/exercise changes last July when his ASCVD risk was 10.4% Wt Readings from Last 3 Encounters:  01/13/20 183 lb 12.8 oz (83.4 kg)  07/13/19 183 lb 6.4 oz (83.2 kg)  07/13/19 183 lb (83 kg)   Lab Results  Component Value Date   CHOL 213 (H) 07/13/2019   HDL 82 07/13/2019   LDLCALC 119 (H) 07/13/2019   LDLDIRECT 113 (H) 05/11/2015   TRIG 62 07/13/2019   CHOLHDL 2.6 07/13/2019   Lab Results  Component Value Date   ALT 22 07/13/2019   AST 23 07/13/2019   ALKPHOS 80 07/13/2019   BILITOT 0.7 07/13/2019      History Patient Active Problem List   Diagnosis Date Noted   Hx of adenomatous polyp of colon 01/25/2015   Status post left hip replacement  10/13/2014   Asthma, chronic 10/13/2014   Osteoarthritis of left hip 10/10/2014   Hip arthritis 10/10/2014   History of asthma 08/08/2014   History of umbilical hernia [id number]   History of pelvic fracture 08/08/2014   Past Medical History:  Diagnosis Date   Arthritis    L&R hip & pelvis    Asthma    has had since he was a child, states he only uses the inhaler PRN   Cataract    Family history of adverse reaction to anesthesia    Pt. not exactly sure-sister"they almost lost her"during  a surgery   Glaucoma    Glaucoma    both eyes   History of blood transfusion    /w pelvic fx.    Hx of adenomatous polyp of colon 01/25/2015   Osteoarthritis of left hip 10/10/2014   Pelvic fracture ([place]) 09/28/1994   Pneumonia    in hosp.,[facility] ( /w pelvic fx)-    Past Surgical History:  Procedure Laterality Date   back cyst  2006   surgery x4 for cyst on the back    EYE SURGERY Bilateral March 2016   Dr. Marylynn Pearson, [place]   hip   HERNIA REPAIR  [id number]   umbilical    INGUINAL HERNIA  REPAIR Right 05/13/2019   Procedure: RIGHT INGUINAL HERNIA REPAIR WITH MESH;  Surgeon: Coralie Keens, MD;  Location: [facility];  Service: General;  Laterality: Right;  AND TAP [place] HIP ARTHROPLASTY Left 10/10/2014   dr Mardelle Matte   TOTAL HIP ARTHROPLASTY Left 10/10/2014   Procedure: TOTAL HIP ARTHROPLASTY;  Surgeon: Johnny Bridge, MD;  Location: [facility];  Service: Orthopedics;  Laterality: Left;   Allergies  Allergen Reactions   Shellfish Allergy Anaphylaxis and Swelling    Throat swells closed," like there is a block of cement in my throat"    Trazodone And Nefazodone     Jittery / shaking    Prior to Admission medications   Medication Sig Start Date End Date Taking? Authorizing Provider  albuterol (VENTOLIN HFA) 108 (90 Base) MCG/ACT inhaler Inhale 1-2 puffs into the lungs every 6 (six) hours as needed for  wheezing or shortness of breath. Ok to fill with preferred pro air 07/13/19  Yes Wendie Agreste, MD  bimatoprost (LUMIGAN) 0.01 % SOLN Place 1 drop into both eyes at bedtime.    Yes [provider]  cetirizine (ZYRTEC) 10 MG tablet Take 1 tablet (10 mg total) by mouth daily. 07/13/19  Yes Wendie Agreste, MD  CINNAMON PO Take 1,000 mg by mouth daily.    Yes [provider]  Flaxseed, Linseed, (FLAXSEED OIL) OIL Take 2,400 mg by mouth.   Yes [provider]  fluticasone (FLOVENT HFA) 44 MCG/ACT inhaler Inhale 2 puffs into the lungs 2 (two) times daily. 07/13/19  Yes Wendie Agreste, MD  Garlic [id number] MG CAPS Take 1,000 mg by mouth daily.    Yes [provider]  Melatonin 1 MG TABS Take 1 mg by mouth at bedtime as needed (for sleep).    Yes [provider]  Multiple Vitamins-Minerals (CENTRUM SILVER 50+MEN PO) Take by mouth.   Yes [provider]  Omega 3-6-9 Fatty Acids (OMEGA-3-6-9 PO) Take 2 capsules by mouth daily.   Yes [provider]  OVER THE COUNTER MEDICATION Place 1 Dose under the tongue 2 (two) times a day. CBD oil   Yes [provider]  RHOPRESSA 0.02 % SOLN  11/22/19  Yes [provider]  tamsulosin (FLOMAX) 0.4 MG CAPS capsule Take 1 capsule (0.4 mg total) by mouth at bedtime. 07/13/19  Yes Wendie Agreste, MD  Turmeric Curcumin 500 MG CAPS Take 2 capsules by mouth daily.    Yes [provider]  Multiple Vitamin (MULITIVITAMIN WITH MINERALS) TABS Take 1 tablet by mouth daily.    [provider]  oxyCODONE (OXY IR/ROXICODONE) 5 MG immediate release tablet Take 1 tablet (5 mg total) by mouth every 6 (six) hours as needed for moderate pain or severe pain. 05/13/19   Coralie Keens, MD  SIMBRINZA 1-0.2 % SUSP Place 1 drop into both eyes 3 (three) times daily.  02/05/19   [provider]   Social History   Socioeconomic History   Marital status: Single    Spouse name: Not on  file   Number of children: 1   Years of education: Not on file   Highest education level: Not on file  Occupational History   Occupation: distribution    Employer: [employer]  Tobacco Use   Smoking status: Never Smoker   Smokeless tobacco: Never Used  Substance and Sexual Activity   Alcohol use: Yes    Alcohol/week: 0.0 standard  drinks    Comment: occasional glass of wine   Drug use: No   Sexual activity: Yes  Other Topics Concern   Not on file  Social History Narrative   Not on file   Social Determinants of Health   Financial Resource Strain: Low Risk    Difficulty of Paying Living Expenses: Not hard at all  Food Insecurity: No Food Insecurity   Worried About Charity fundraiser in the Last Year: Never true   [place] in the Last Year: Never true  Transportation Needs: No Transportation Needs   Lack of Transportation (Medical): No   Lack of Transportation (Non-Medical): No  Physical Activity: Unknown   Days of Exercise per Week: 0 days   Minutes of Exercise per Session: Not on file  Stress: No Stress Concern Present   Feeling of Stress : Not at all  Social Connections: Somewhat Isolated   Frequency of Communication with Friends and Family: Once a week   Frequency of Social Gatherings with Friends and Family: Once a week   Attends Religious Services: 1 to 4 times per year   Active Member of [employer] or Organizations: Yes   Attends Archivist Meetings: 1 to 4 times per year   Marital Status: Divorced  Human resources officer Violence: Not At Risk   Fear of Current or Ex-Partner: No   Emotionally Abused: No   Physically Abused: No   Sexually Abused: No    Review of Systems  Per HPI.  No hx of gout known.  Objective:   Vitals:   01/13/20 0929  BP: 132/75  Pulse: 76  Temp: 98.1 F (36.7 C)  TempSrc: Temporal  SpO2: 99%  Weight: 183 lb 12.8 oz (83.4 kg)  Height: 5\' 11"  (1.803 m)     Physical Exam Vitals  reviewed.  Constitutional:      Appearance: He is well-developed.  HENT:     Head: Normocephalic and atraumatic.  Eyes:     Pupils: Pupils are equal, round, and reactive to light.  Neck:     Vascular: No carotid bruit or JVD.  Cardiovascular:     Rate and Rhythm: Normal rate and regular rhythm.     Heart sounds: Normal heart sounds. No murmur.  Pulmonary:     Effort: Pulmonary effort is normal.     Breath sounds: Normal breath sounds. No rales.  Musculoskeletal:     Left knee: Effusion (1-2+slight warmth, no erythema) and crepitus present. No erythema, ecchymosis or bony tenderness. Normal range of motion. Tenderness present over the medial joint line. No LCL laxity, MCL laxity or ACL laxity.Abnormal meniscus (min discomfort with mcmurray. ). Normal patellar mobility. Normal pulse.     Instability Tests: Positive medial McMurray test.     Left lower leg: No edema.  Skin:    General: Skin is warm and dry.  Neurological:     Mental Status: He is alert and oriented to person, place, and time.    Risks (including but not limited to bleeding and infection, damage to underlying tissue), benefits, and alternatives discussed for left knee injection.  Verbal and signed consent obtained after any questions were answered. Landmarks noted, and marked as needed. Area cleansed with Betadine x3, ethyl chloride spray for topical anesthesia, followed by alcohol swab.  Injected with 3cc lidocaine 1%, 1 cc kenalog 40mg /ml, superolateral approach after 10cc clear yellow fluid withdrawn, hemostat used to exchange syringe with same needle, verified location after exchange with joint fluid withdrawn.  min blood at initial aspirate only.  No complications. Bandage applied. Felt much better.  RTC precautions discussed in regards to injection.  DG Knee Complete 4 Views Left  Result Date: 01/13/2020 CLINICAL DATA:  Left knee pain for 6 weeks.  No known injury. EXAM: LEFT KNEE - COMPLETE 4+ VIEW FINDINGS: Diffuse  tricompartment degenerative change. No acute bony abnormality identified. No evidence of fracture or dislocation. Small knee joint effusion cannot be excluded. IMPRESSION: Diffuse tricompartment degenerative change. No acute bony abnormality. Small knee joint effusion cannot be excluded. Electronically Signed   By: Marcello Moores  Register   On: 01/13/2020 10:40     Assessment & Plan:  CHRISTOPHER MCARDLE is a 69 y.o. male . Benign prostatic hyperplasia, unspecified whether lower urinary tract symptoms present Nocturia  -Tolerating Flomax.  Option of meeting with urologist/discussion with urologist if worsening nocturia.  Hyperlipidemia, unspecified hyperlipidemia type - Plan: Lipid panel, Comprehensive metabolic panel  -Mild elevated ASCVD risk score previously, repeat labs.  Mild intermittent asthma without complication - Plan: fluticasone (FLOVENT HFA) 44 MCG/ACT inhaler  -Stable with current use of Flovent, continue same.  Acute pain of left knee - Plan: DG Knee Complete 4 Views Left Swelling of knee joint, left - Plan: DG Knee Complete 4 Views Left  -Tricompartmental degenerative changes seen.  Suspected flare of DJD versus degenerative meniscal tear.  Treatment options were discussed, has not responded to some of his herbal/over-the-counter treatments over the past month and a half.  Risks, benefits, alternatives of injection as above.   -Superolateral approach with aspiration and injection with significant improvement in symptoms, no complications.  RTC precautions given.  If persistent symptoms consider orthopedic eval.  Meds ordered this encounter  Medications   fluticasone (FLOVENT HFA) 44 MCG/ACT inhaler    Sig: Inhale 2 puffs into the lungs 2 (two) times daily.    Dispense:  3 Inhaler    Refill:  2   Patient Instructions        If you are having issues with urination/prostate - please call urologist for follow up. Continue flomax for now.   Knee pain should continue to improve,  if any fever, redness, increased swelling, be seen right away.  Let me know if that pain returns.  No change in other medications at this time.  I will check some lab work.   Return to the clinic or go to the nearest emergency room if any of your symptoms worsen or new symptoms occur.   If you have lab work done today you will be contacted with your lab results within the next 2 weeks.  If you have not heard from [place] then please contact us. The fastest way to get your results is to register for My Chart.   IF you received an x-ray today, you will receive an invoice from [facility]. Please contact [facility] at [phone] with questions or concerns regarding your invoice.   IF you received labwork today, you will receive an invoice from [facility]. Please contact [facility] at [phone] with questions or concerns regarding your invoice.   Our billing staff will not be able to assist you with questions regarding bills from these companies.  You will be contacted with the lab results as soon as they are available. The fastest way to get your results is to activate your My Chart account. Instructions are located on the last page of this paperwork. If you have not heard from [place] regarding the results in 2 weeks, please contact  this office.          Signed, Merri Ray, MD [facility]

## 2020-01-14 LAB — COMPREHENSIVE METABOLIC PANEL
ALT: 18 IU/L (ref 0–44)
AST: 17 IU/L (ref 0–40)
Albumin/Globulin Ratio: 1.8 (ref 1.2–2.2)
Albumin: 4.2 g/dL (ref 3.8–4.8)
Alkaline Phosphatase: 85 IU/L (ref 39–117)
BUN/Creatinine Ratio: 15 (ref 10–24)
BUN: 14 mg/dL (ref 8–27)
Bilirubin Total: 0.7 mg/dL (ref 0.0–1.2)
CO2: 24 mmol/L (ref 20–29)
Calcium: 9.2 mg/dL (ref 8.6–10.2)
Chloride: 105 mmol/L (ref 96–106)
Creatinine, Ser: 0.96 mg/dL (ref 0.76–1.27)
GFR calc Af Amer: 92 mL/min/{1.73_m2} (ref >59)
GFR calc non Af Amer: 80 mL/min/{1.73_m2} (ref >59)
Globulin, Total: 2.4 g/dL (ref 1.5–4.5)
Glucose: 88 mg/dL (ref 65–99)
Potassium: 4.5 mmol/L (ref 3.5–5.2)
Sodium: 140 mmol/L (ref 134–144)
Total Protein: 6.6 g/dL (ref 6.0–8.5)

## 2020-01-14 LAB — LIPID PANEL
Chol/HDL Ratio: 2.6 ratio (ref 0.0–5.0)
Cholesterol, Total: 201 mg/dL — ABNORMAL HIGH (ref 100–199)
HDL: 78 mg/dL (ref >39)
LDL Chol Calc (NIH): 111 mg/dL — ABNORMAL HIGH (ref 0–99)
Triglycerides: 68 mg/dL (ref 0–149)
VLDL Cholesterol Cal: 12 mg/dL (ref 5–40)

## 2020-01-19 DIAGNOSIS — H401132 Primary open-angle glaucoma, bilateral, moderate stage: Secondary | ICD-10-CM | POA: Diagnosis not present

## 2020-01-19 DIAGNOSIS — H2513 Age-related nuclear cataract, bilateral: Secondary | ICD-10-CM | POA: Diagnosis not present

## 2020-02-25 IMAGING — DX DG PELVIS 1-2V
2 series · 2 of 2 positions shown · non-contrast
Comparison: 10/10/2014

CLINICAL DATA: History of prior pelvic fracture with surgery and
left hip replacement. Anterior right pubic and groin pain.

EXAM:
PELVIS - 1-2 VIEW

[pelvis ap (1 of 2)]
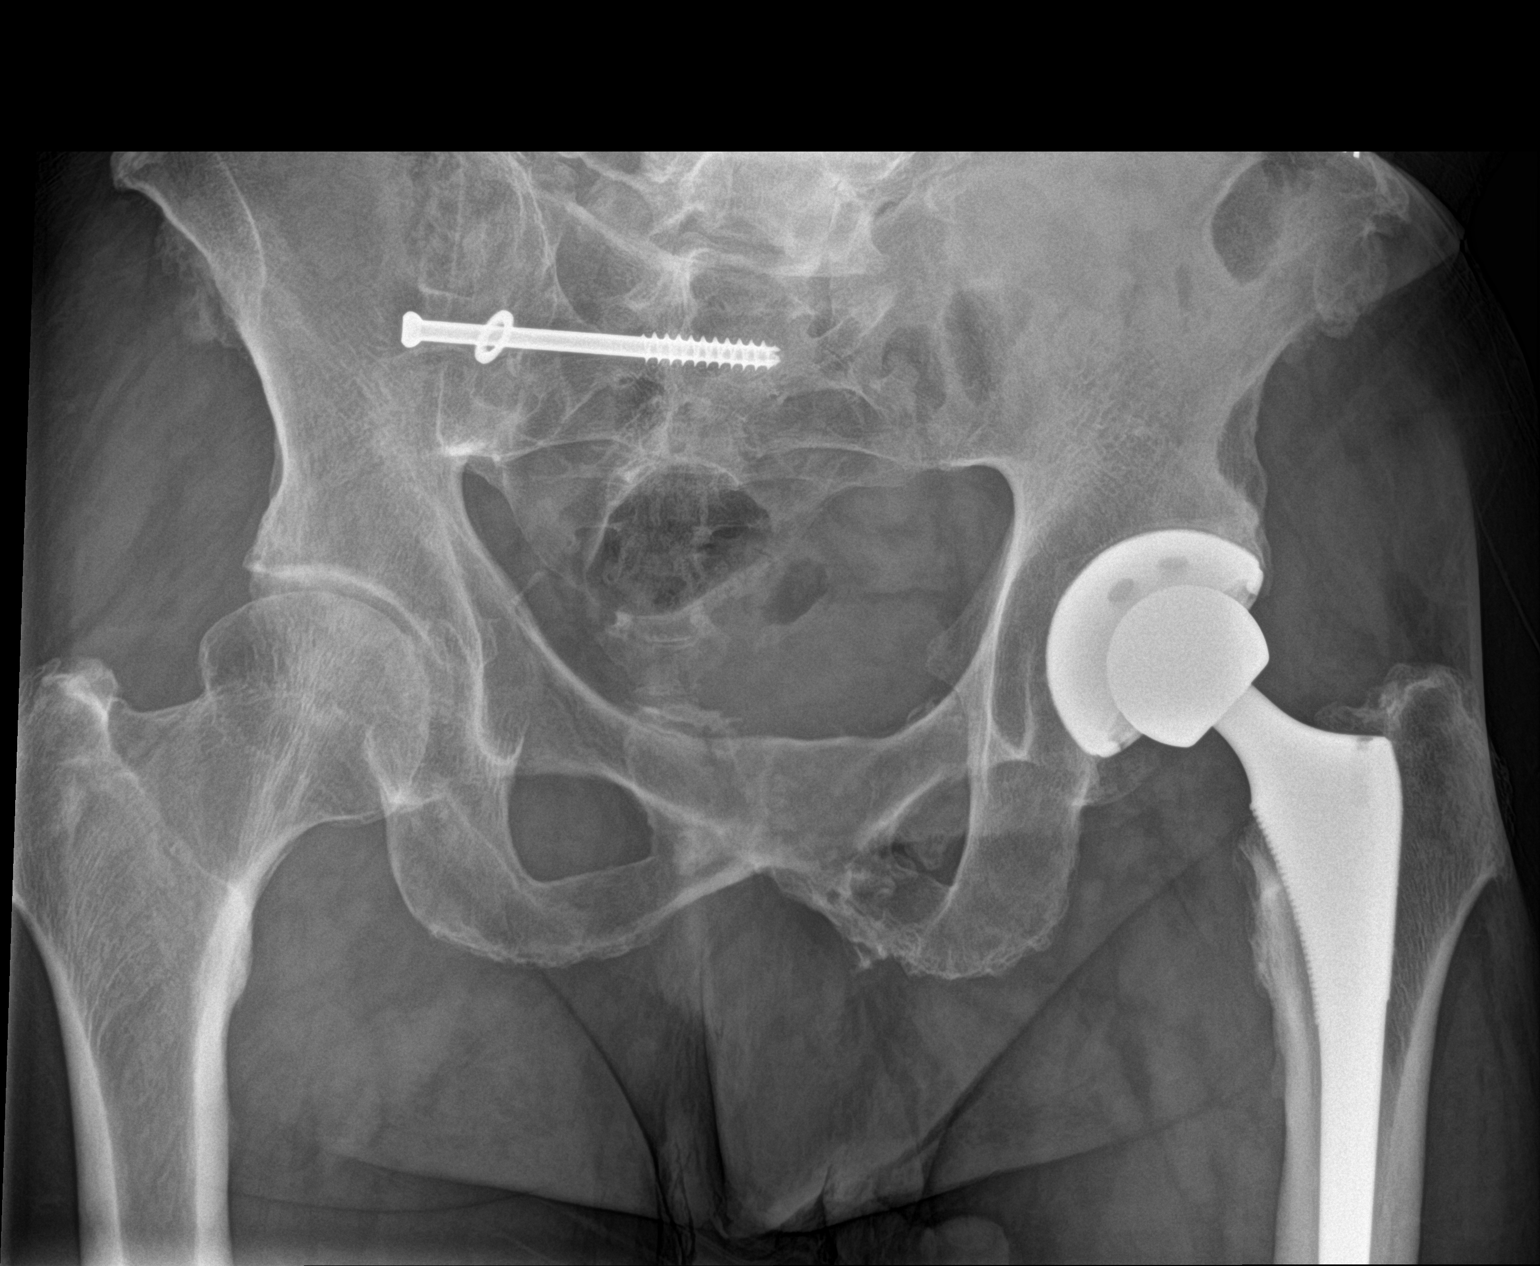

[pelvis ap (2 of 2)]
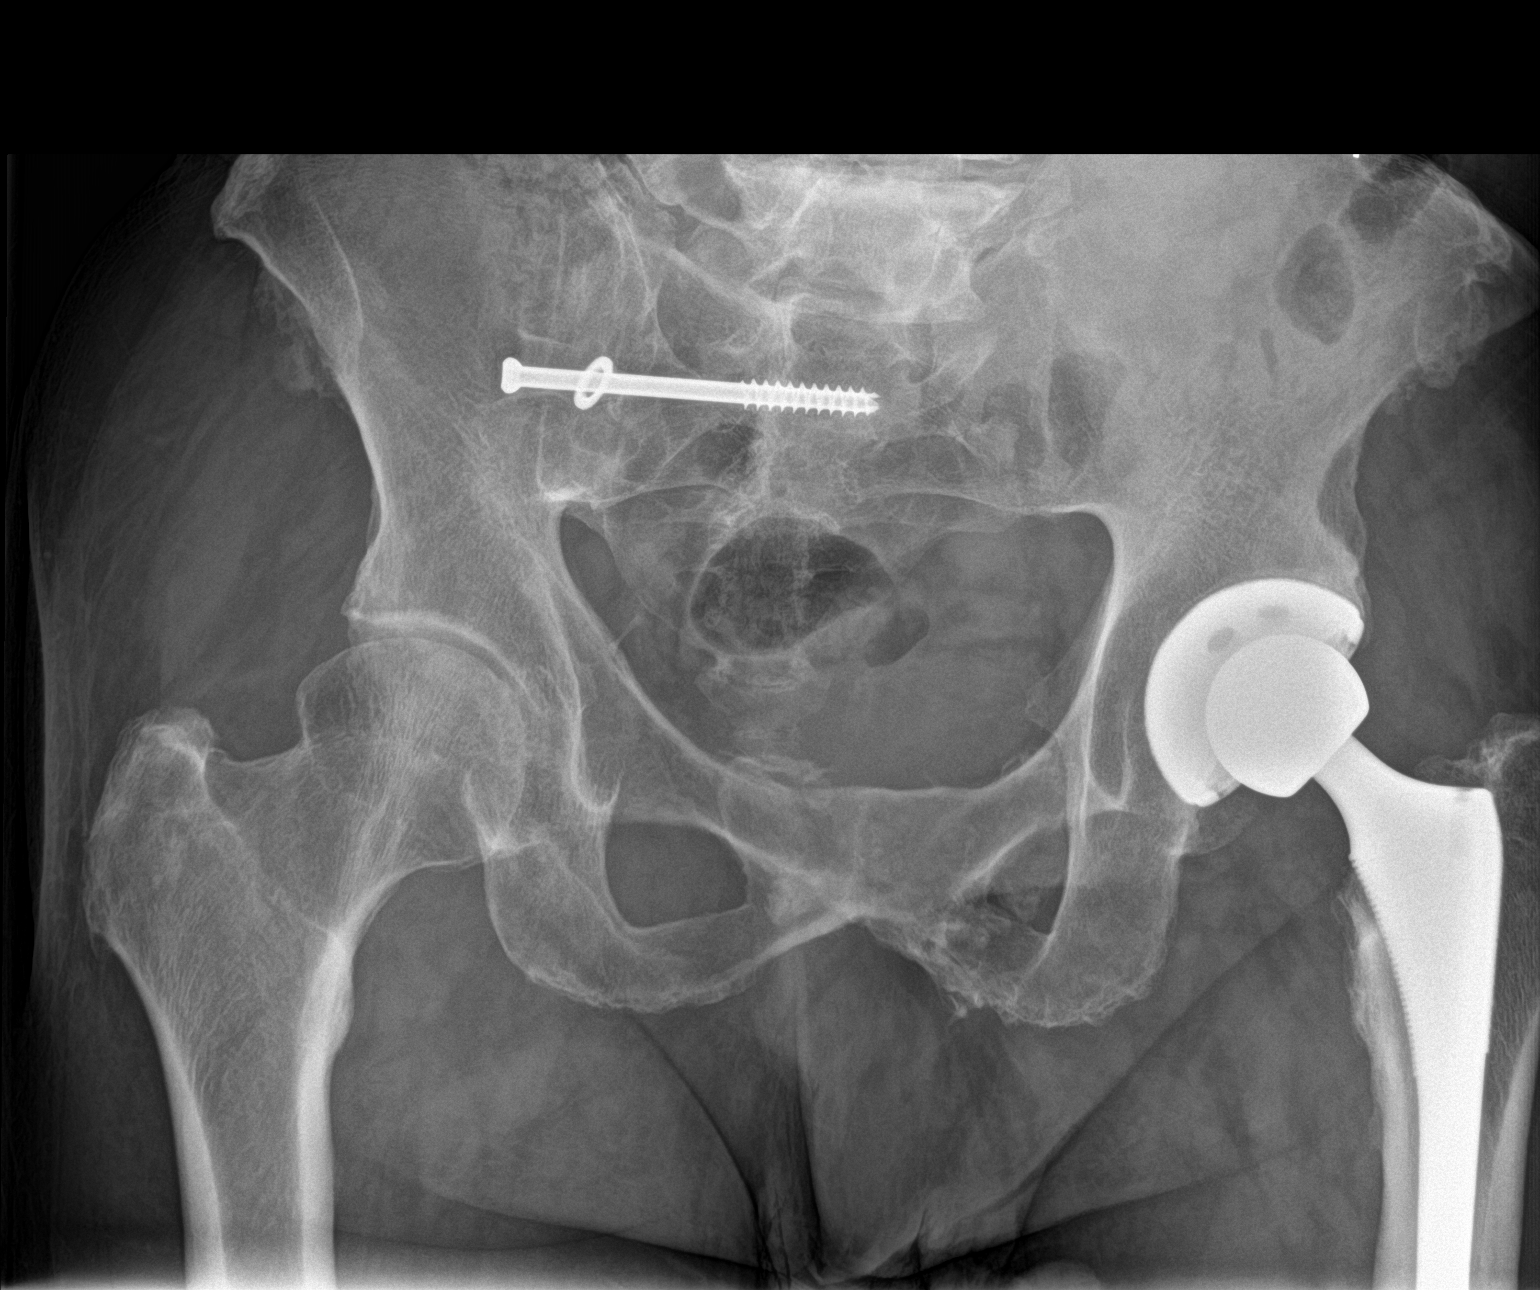

[2 of 2 positions shown; findings below may reference images not displayed]

FINDINGS: Stable evidence of prior left-sided pubic fractures, fused
appearance of pubic symphysis and prior screw fixation across the
right sacroiliac joint. No acute fracture or evidence of bony
destruction. Moderate degenerative disease present at the level of
the right hip joint and visualized lower lumbar spine. Visualized
portion of a left hip arthroplasty shows normal alignment.
IMPRESSION: Stable evidence of prior left-sided pubic fractures, fused
appearance of pubic symphysis and operative screw fixation across
the right sacroiliac joint. Moderate osteoarthritis of the right hip
joint. Degenerative disc disease of the visualized lower lumbar
spine.

## 2020-04-18 DIAGNOSIS — H2513 Age-related nuclear cataract, bilateral: Secondary | ICD-10-CM | POA: Diagnosis not present

## 2020-04-18 DIAGNOSIS — H401132 Primary open-angle glaucoma, bilateral, moderate stage: Secondary | ICD-10-CM | POA: Diagnosis not present

## 2020-07-13 ENCOUNTER — Encounter: Payer: Self-pay | Admitting: Family Medicine

## 2020-07-13 ENCOUNTER — Ambulatory Visit (INDEPENDENT_AMBULATORY_CARE_PROVIDER_SITE_OTHER): Payer: Medicare HMO | Admitting: Family Medicine

## 2020-07-13 ENCOUNTER — Other Ambulatory Visit: Payer: Self-pay

## 2020-07-13 VITALS — BP 155/89 | HR 80 | Temp 98.0°F | Ht 71.0 in | Wt 174.0 lb

## 2020-07-13 DIAGNOSIS — J309 Allergic rhinitis, unspecified: Secondary | ICD-10-CM | POA: Diagnosis not present

## 2020-07-13 DIAGNOSIS — M1712 Unilateral primary osteoarthritis, left knee: Secondary | ICD-10-CM

## 2020-07-13 DIAGNOSIS — M25561 Pain in right knee: Secondary | ICD-10-CM | POA: Diagnosis not present

## 2020-07-13 DIAGNOSIS — G4700 Insomnia, unspecified: Secondary | ICD-10-CM

## 2020-07-13 DIAGNOSIS — R351 Nocturia: Secondary | ICD-10-CM | POA: Diagnosis not present

## 2020-07-13 DIAGNOSIS — J4520 Mild intermittent asthma, uncomplicated: Secondary | ICD-10-CM | POA: Diagnosis not present

## 2020-07-13 DIAGNOSIS — E785 Hyperlipidemia, unspecified: Secondary | ICD-10-CM | POA: Diagnosis not present

## 2020-07-13 DIAGNOSIS — M25562 Pain in left knee: Secondary | ICD-10-CM | POA: Diagnosis not present

## 2020-07-13 MED ORDER — ALBUTEROL SULFATE HFA 108 (90 BASE) MCG/ACT IN AERS: 1.0000 | INHALATION_SPRAY | Freq: Four times a day (QID) | RESPIRATORY_TRACT | 1 refills | Status: DC | PRN

## 2020-07-13 MED ORDER — CETIRIZINE HCL 10 MG PO TABS: 10.0000 mg | ORAL_TABLET | Freq: Every day | ORAL | 2 refills | Status: DC

## 2020-07-13 MED ORDER — FLOVENT HFA 110 MCG/ACT IN AERO: 2.0000 | INHALATION_SPRAY | Freq: Two times a day (BID) | RESPIRATORY_TRACT | 12 refills | Status: DC

## 2020-07-13 MED ORDER — TAMSULOSIN HCL 0.4 MG PO CAPS: 0.4000 mg | ORAL_CAPSULE | Freq: Every day | ORAL | 3 refills | Status: DC

## 2020-07-13 NOTE — Progress Notes (Signed)
Subjective:  Patient ID: Douglas Russell, male    DOB: 1951/05/30  Age: 69 y.o. MRN: [id number]  CC:  Chief Complaint  Patient presents with  . Follow-up    on Acute pain of left knee and Hyperlipidemia. Pt reports he is still having the pain in his L knee and pt reports some edima on the backside of the L knee. Pt states no physical symptoms of hyperlipidemia. pt repots some fatigue, but states thats due to not sleeping well pt sees a sleep specalist.Pt is not fasting pt reports he had a big brakefast.    HPI Douglas Russell presents for   L knee pain: Discussed in January.  Pain for 1 to 1/2 months at that time, no mechanical symptoms. Tricompartmental degenerative changes seen on x-ray.  Kenalog and lidocaine injection given at that time.  Some relief for about 2 months, then pain returned.  No ortho eval recently, prior hip surgery by Dr. Mardelle Matte.  Occasional R knee pain as well. No recent injury, same pain as in past.  Pain not affecting sleep.  Tx: glucosamine, fish oil.   BPH: flomax working well.  Nocturia x 2. Stable.   Asthma: Taking flovent 2 puffs BID.  Has wheezing at night 2-3 times per week. Not using albuterol.  Dusty work - wearing dust mask.  Zyrtec working for allergies.   Sleep difficulty: Wakes up frequently, ttried multiple meds incl ambien, seroquel, no relief.  Uses herbal med for sleep. Some chronic insomnia. Would like to meet with sleep specialist, last sleep study in 1996 - nonrestorative light stage sleep.    Hyperlipidemia: Mild elevation previously.  Not fasting today. Lab Results  Component Value Date   CHOL 195 07/13/2020   HDL 78 07/13/2020   LDLCALC 105 (H) 07/13/2020   LDLDIRECT 113 (H) 05/11/2015   TRIG 64 07/13/2020   CHOLHDL 2.5 07/13/2020   Lab Results  Component Value Date   ALT 16 07/13/2020   AST 16 07/13/2020   ALKPHOS 83 07/13/2020   BILITOT 0.7 07/13/2020     History Patient Active Problem List   Diagnosis Date  Noted  . Hx of adenomatous polyp of colon 01/25/2015  . Status post left hip replacement 10/13/2014  . Asthma, chronic 10/13/2014  . Osteoarthritis of left hip 10/10/2014  . Hip arthritis 10/10/2014  . History of asthma 08/08/2014  . History of umbilical hernia 40/98/1191  . History of pelvic fracture 08/08/2014   Past Medical History:  Diagnosis Date  . Arthritis    L&R hip & pelvis   . Asthma    has had since he was a child, states he only uses the inhaler PRN  . Cataract   . Family history of adverse reaction to anesthesia    Pt. not exactly sure-sister"they almost lost her"during  a surgery  . Glaucoma   . Glaucoma    both eyes  . History of blood transfusion    /w pelvic fx.   Marland Kitchen Hx of adenomatous polyp of colon 01/25/2015  . Osteoarthritis of left hip 10/10/2014  . Pelvic fracture ([place]) 09/28/1994  . Pneumonia    in hosp.,[facility] ( /w pelvic fx)-    Past Surgical History:  Procedure Laterality Date  . back cyst  2006   surgery x4 for cyst on the back   . EYE SURGERY Bilateral March 2016   Dr. Marylynn Pearson, [place]  . FRACTURE SURGERY Right 1995   hip  . HERNIA REPAIR  2006  umbilical   . INGUINAL HERNIA REPAIR Right 05/13/2019   Procedure: RIGHT INGUINAL HERNIA REPAIR WITH MESH;  Surgeon: Coralie Keens, MD;  Location: [place];  Service: General;  Laterality: Right;  AND TAP BLOCK  . PELVIC FRACTURE SURGERY  1995  . TOTAL HIP ARTHROPLASTY Left 10/10/2014   dr Mardelle Matte  . TOTAL HIP ARTHROPLASTY Left 10/10/2014   Procedure: TOTAL HIP ARTHROPLASTY;  Surgeon: Johnny Bridge, MD;  Location: [place];  Service: Orthopedics;  Laterality: Left;   Allergies  Allergen Reactions  . Shellfish Allergy Anaphylaxis and Swelling    Throat swells closed," like there is a block of cement in my throat"   . Trazodone And Nefazodone     Jittery / shaking    Prior to Admission medications   Medication Sig Start Date End Date Taking? Authorizing Provider  albuterol (VENTOLIN HFA) 108 (90  Base) MCG/ACT inhaler Inhale 1-2 puffs into the lungs every 6 (six) hours as needed for wheezing or shortness of breath. Ok to fill with preferred pro air 07/13/19  Yes Wendie Agreste, MD  bimatoprost (LUMIGAN) 0.01 % SOLN Place 1 drop into both eyes at bedtime.    Yes [provider]  cetirizine (ZYRTEC) 10 MG tablet Take 1 tablet (10 mg total) by mouth daily. 07/13/19  Yes Wendie Agreste, MD  CINNAMON PO Take 1,000 mg by mouth daily.    Yes [provider]  Flaxseed, Linseed, (FLAXSEED OIL) OIL Take 2,400 mg by mouth.   Yes [provider]  fluticasone (FLOVENT HFA) 44 MCG/ACT inhaler Inhale 2 puffs into the lungs 2 (two) times daily. 01/13/20  Yes Wendie Agreste, MD  Garlic 2951 MG CAPS Take 1,000 mg by mouth daily.    Yes [provider]  Melatonin 1 MG TABS Take 1 mg by mouth at bedtime as needed (for sleep).    Yes [provider]  Multiple Vitamins-Minerals (CENTRUM SILVER 50+MEN PO) Take by mouth.   Yes [provider]  Omega 3-6-9 Fatty Acids (OMEGA-3-6-9 PO) Take 2 capsules by mouth daily.   Yes [provider]  OVER THE COUNTER MEDICATION Place 1 Dose under the tongue 2 (two) times a day. CBD oil   Yes [provider]  RHOPRESSA 0.02 % SOLN  11/22/19  Yes [provider]  SIMBRINZA 1-0.2 % SUSP Place 1 drop into both eyes 3 (three) times daily.  02/05/19  Yes [provider]  tamsulosin (FLOMAX) 0.4 MG CAPS capsule Take 1 capsule (0.4 mg total) by mouth at bedtime. 07/13/19  Yes Wendie Agreste, MD  Turmeric Curcumin 500 MG CAPS Take 2 capsules by mouth daily.    Yes [provider]   Social History   Socioeconomic History  . Marital status: Single    Spouse name: Not on file  . Number of children: 1  . Years of education: Not on file  . Highest education level: Not on file  Occupational History  . Occupation: distribution    Employer: [employer]  Tobacco Use  .  Smoking status: Never Smoker  . Smokeless tobacco: Never Used  Vaping Use  . Vaping Use: Never used  Substance and Sexual Activity  . Alcohol use: Yes    Alcohol/week: 0.0 standard drinks    Comment: occasional glass of wine  . Drug use: No  . Sexual activity: Yes  Other Topics Concern  . Not on file  Social History Narrative  . Not on file  Social Determinants of Health   Financial Resource Strain:   . Difficulty of Paying Living Expenses:   Food Insecurity:   . Worried About Charity fundraiser in the Last Year:   . Arboriculturist in the Last Year:   Transportation Needs:   . Film/video editor (Medical):   Marland Kitchen Lack of Transportation (Non-Medical):   Physical Activity:   . Days of Exercise per Week:   . Minutes of Exercise per Session:   Stress:   . Feeling of Stress :   Social Connections:   . Frequency of Communication with Friends and Family:   . Frequency of Social Gatherings with Friends and Family:   . Attends Religious Services:   . Active Member of Clubs or Organizations:   . Attends Archivist Meetings:   Marland Kitchen Marital Status:   Intimate Partner Violence:   . Fear of Current or Ex-Partner:   . Emotionally Abused:   Marland Kitchen Physically Abused:   . Sexually Abused:     Review of Systems   Objective:   Vitals:   07/13/20 0921  BP: (!) 155/89  Pulse: 80  Temp: 98 F (36.7 C)  TempSrc: Temporal  SpO2: 98%  Weight: 174 lb (78.9 kg)  Height: 5\' 11"  (1.803 m)     Physical Exam Vitals reviewed.  Constitutional:      Appearance: He is well-developed.  HENT:     Head: Normocephalic and atraumatic.  Eyes:     Pupils: Pupils are equal, round, and reactive to light.  Neck:     Vascular: No carotid bruit or JVD.  Cardiovascular:     Rate and Rhythm: Normal rate and regular rhythm.     Heart sounds: Normal heart sounds. No murmur heard.   Pulmonary:     Effort: Pulmonary effort is normal.     Breath sounds: Normal breath sounds. No wheezing  or rales.  Musculoskeletal:     Comments: Bilateral knees: Right knee full range of motion, crepitus, medial greater than lateral joint line tenderness with bony prominence.  No apparent effusion, skin intact without erythema. Left knee slight decreased terminal extension, flexion, skin intact, no apparent effusion, medial greater than lateral joint line tenderness with bony prominence, and crepitus.  Skin:    General: Skin is warm and dry.  Neurological:     Mental Status: He is alert and oriented to person, place, and time.        Assessment & Plan:  Douglas Russell is a 69 y.o. male . Pain in both knees, unspecified chronicity - Plan: Ambulatory referral to Orthopedic Surgery Osteoarthritis of left knee, unspecified osteoarthritis type - Plan: Ambulatory referral to Orthopedic Surgery  -Tricompartmental degenerative changes noted previously.  Unsure if repeat injection may be beneficial versus viscosupplementation versus need for knee replacement.  Will refer to orthopedics to determine next step in treatment.  Mild intermittent asthma without complication - Plan: albuterol (VENTOLIN HFA) 108 (90 Base) MCG/ACT inhaler, fluticasone (FLOVENT HFA) 110 MCG/ACT inhaler  -Decreased control based on home symptoms, higher dose of Flovent HFA prescribed, continue albuterol if needed for breakthrough symptoms with RTC precautions.  Nocturia - Plan: tamsulosin (FLOMAX) 0.4 MG CAPS capsule  -Stable, continue Flomax  Allergic rhinitis, unspecified seasonality, unspecified trigger - Plan: cetirizine (ZYRTEC) 10 MG tablet  -Stable, continue Zyrtec  Insomnia, unspecified type - Plan: Ambulatory referral to Sleep Studies  -Refer to sleep specialist for evaluation, denies recent testing.  Hyperlipidemia, unspecified hyperlipidemia type -  Plan: Comprehensive metabolic panel, Lipid panel  -Check labs -plan for return for fasting lab visit.  Meds ordered this encounter  Medications  . albuterol  (VENTOLIN HFA) 108 (90 Base) MCG/ACT inhaler    Sig: Inhale 1-2 puffs into the lungs every 6 (six) hours as needed for wheezing or shortness of breath. Ok to fill with preferred pro air    Dispense:  18 g    Refill:  1  . tamsulosin (FLOMAX) 0.4 MG CAPS capsule    Sig: Take 1 capsule (0.4 mg total) by mouth at bedtime.    Dispense:  90 capsule    Refill:  3  . cetirizine (ZYRTEC) 10 MG tablet    Sig: Take 1 tablet (10 mg total) by mouth daily.    Dispense:  90 tablet    Refill:  2  . fluticasone (FLOVENT HFA) 110 MCG/ACT inhaler    Sig: Inhale 2 puffs into the lungs in the morning and at bedtime.    Dispense:  1 Inhaler    Refill:  12   Patient Instructions   Tylenol over the counter ok for knee pain. Let me know if stronger medication needed.  I will refer you to ortho for options.  Try higher dose flovent. If still having wheezing at night - return for other changes.  Albuterol if breakthrough wheeze if needed.   I will refer you to sleep specialist.   Return for lab visit.      If you have lab work done today you will be contacted with your lab results within the next 2 weeks.  If you have not heard from [place] then please contact us. The fastest way to get your results is to register for My Chart.   IF you received an x-ray today, you will receive an invoice from [facility]. Please contact [facility] at [phone] with questions or concerns regarding your invoice.   IF you received labwork today, you will receive an invoice from [place]. Please contact [facility] at [phone] with questions or concerns regarding your invoice.   Our billing staff will not be able to assist you with questions regarding bills from these companies.  You will be contacted with the lab results as soon as they are available. The fastest way to get your results is to activate your My Chart account. Instructions are located on the last page of this paperwork. If you have not  heard from [place] regarding the results in 2 weeks, please contact this office.         Signed, Merri Ray, MD [facility]

## 2020-07-13 NOTE — Patient Instructions (Addendum)
Tylenol over the counter ok for knee pain. Let me know if stronger medication needed.  I will refer you to ortho for options.  Try higher dose flovent. If still having wheezing at night - return for other changes.  Albuterol if breakthrough wheeze if needed.   I will refer you to sleep specialist.   Return for lab visit.      If you have lab work done today you will be contacted with your lab results within the next 2 weeks.  If you have not heard from [place] then please contact us. The fastest way to get your results is to register for My Chart.   IF you received an x-ray today, you will receive an invoice from [facility]. Please contact [facility] at [phone] with questions or concerns regarding your invoice.   IF you received labwork today, you will receive an invoice from [place]. Please contact [facility] at [phone] with questions or concerns regarding your invoice.   Our billing staff will not be able to assist you with questions regarding bills from these companies.  You will be contacted with the lab results as soon as they are available. The fastest way to get your results is to activate your My Chart account. Instructions are located on the last page of this paperwork. If you have not heard from [place] regarding the results in 2 weeks, please contact this office.

## 2020-07-14 ENCOUNTER — Encounter: Payer: Self-pay | Admitting: Family Medicine

## 2020-07-14 LAB — LIPID PANEL
Chol/HDL Ratio: 2.5 ratio (ref 0.0–5.0)
Cholesterol, Total: 195 mg/dL (ref 100–199)
HDL: 78 mg/dL (ref >39)
LDL Chol Calc (NIH): 105 mg/dL — ABNORMAL HIGH (ref 0–99)
Triglycerides: 64 mg/dL (ref 0–149)
VLDL Cholesterol Cal: 12 mg/dL (ref 5–40)

## 2020-07-14 LAB — COMPREHENSIVE METABOLIC PANEL
ALT: 16 IU/L (ref 0–44)
AST: 16 IU/L (ref 0–40)
Albumin/Globulin Ratio: 1.7 (ref 1.2–2.2)
Albumin: 4.1 g/dL (ref 3.7–4.7)
Alkaline Phosphatase: 83 IU/L (ref 48–121)
BUN/Creatinine Ratio: 24 (ref 10–24)
BUN: 19 mg/dL (ref 8–27)
Bilirubin Total: 0.7 mg/dL (ref 0.0–1.2)
CO2: 24 mmol/L (ref 20–29)
Calcium: 9.1 mg/dL (ref 8.6–10.2)
Chloride: 104 mmol/L (ref 96–106)
Creatinine, Ser: 0.8 mg/dL (ref 0.76–1.27)
GFR calc Af Amer: 104 mL/min/{1.73_m2} (ref >59)
GFR calc non Af Amer: 90 mL/min/{1.73_m2} (ref >59)
Globulin, Total: 2.4 g/dL (ref 1.5–4.5)
Glucose: 98 mg/dL (ref 65–99)
Potassium: 4.3 mmol/L (ref 3.5–5.2)
Sodium: 140 mmol/L (ref 134–144)
Total Protein: 6.5 g/dL (ref 6.0–8.5)

## 2020-08-15 ENCOUNTER — Other Ambulatory Visit: Payer: Self-pay

## 2020-08-15 ENCOUNTER — Encounter: Payer: Self-pay | Admitting: Family Medicine

## 2020-08-15 ENCOUNTER — Ambulatory Visit (INDEPENDENT_AMBULATORY_CARE_PROVIDER_SITE_OTHER): Payer: Medicare HMO | Admitting: Family Medicine

## 2020-08-15 VITALS — BP 135/83 | HR 71 | Temp 98.3°F | Ht 71.0 in | Wt 177.0 lb

## 2020-08-15 DIAGNOSIS — G4700 Insomnia, unspecified: Secondary | ICD-10-CM

## 2020-08-15 DIAGNOSIS — N400 Enlarged prostate without lower urinary tract symptoms: Secondary | ICD-10-CM | POA: Diagnosis not present

## 2020-08-15 DIAGNOSIS — R351 Nocturia: Secondary | ICD-10-CM

## 2020-08-15 DIAGNOSIS — J4520 Mild intermittent asthma, uncomplicated: Secondary | ICD-10-CM | POA: Diagnosis not present

## 2020-08-15 NOTE — Patient Instructions (Addendum)
  Try higher dose of tamsulosin - 2 pills at bedtime to see if that helps sleep and frequency of nighttime urination. If new side effects, go back to 1. If not helping sleep - then follow up with sleep specialist. If 2 per day is better, let me know and I will send in a new prescription.   I'm glad to hear that asthma is better - no change in meds for now.    If you have lab work done today you will be contacted with your lab results within the next 2 weeks.  If you have not heard from [place] then please contact us. The fastest way to get your results is to register for My Chart.   IF you received an x-ray today, you will receive an invoice from [facility]. Please contact [facility] Radiology at [phone] with questions or concerns regarding your invoice.   IF you received labwork today, you will receive an invoice from [place]. Please contact [facility] at [phone] with questions or concerns regarding your invoice.   Our billing staff will not be able to assist you with questions regarding bills from these companies.  You will be contacted with the lab results as soon as they are available. The fastest way to get your results is to activate your My Chart account. Instructions are located on the last page of this paperwork. If you have not heard from [place] regarding the results in 2 weeks, please contact this office.

## 2020-08-15 NOTE — Progress Notes (Signed)
Subjective:  Patient ID: Douglas Russell, male    DOB: [birth year]  Age: 69 y.o. MRN: [id number]  CC:  Chief Complaint  Patient presents with  . Follow-up    on asthma and isnomnis.  Pt reports he hasn't had any issues with his asthma since last OV. Pt states he is getting about 4 hours of sleep a night even with taking melitonin.      HPI [name] presents for  Asthma.   Last discussed July 16. Wheezing few nights per week at that time with Flovent 2 puffs twice daily.  Zyrtec for allergies.  Flovent increased to 110 mcg 2 puffs twice daily.  Albuterol as needed Doing well on new dose. No breakthrough wheeze. No albuterol needed since last visit.   Insomnia: Frequent wakening discussed July 16.  Minimal relief with Ambien, Seroquel, herbal medication.  Previous sleep study in 1996 with nonrestorative light stage sleep.  History of BPH with LUTS with nocturia x2, had reported Flomax working well last visit.  Also been placed few times from [place] sleep [facility], message left for patient. Work has been busy, and in middle of worker's comp case. Time constraints to see specialist. Has number when ready.  Still 2-3 episodes per night, UOP each time.  Has not set up recent appt with urology.  Lab Results  Component Value Date   PSA1 0.6 07/13/2019   PSA1 0.6 03/28/2017   PSA 0.40 01/30/2016   PSA 0.45 05/11/2015   PSA 0.58 01/11/2013   .    History Patient Active Problem List   Diagnosis Date Noted  . Hx of adenomatous polyp of colon 01/25/2015  . Status post left hip replacement 10/13/2014  . Asthma, chronic 10/13/2014  . Osteoarthritis of left hip 10/10/2014  . Hip arthritis 10/10/2014  . History of asthma 08/08/2014  . History of umbilical hernia 40/98/1191  . History of pelvic fracture 08/08/2014   Past Medical History:  Diagnosis Date  . Arthritis    L&R hip & pelvis   . Asthma    has had since he was a child, states he only uses the inhaler PRN  .  Cataract   . Family history of adverse reaction to anesthesia    Pt. not exactly sure-sister"they almost lost her"during  a surgery  . Glaucoma   . Glaucoma    both eyes  . History of blood transfusion    /w pelvic fx.   Marland Kitchen Hx of adenomatous polyp of colon 01/25/2015  . Osteoarthritis of left hip 10/10/2014  . Pelvic fracture ([place]) 09/28/1994  . Pneumonia    in hosp[facility] ( /w pelvic fx)-    Past Surgical History:  Procedure Laterality Date  . back cyst  2006   surgery x4 for cyst on the back   . EYE SURGERY Bilateral March 2016   Dr. Marylynn Pearson, [place]  . FRACTURE SURGERY Right 1995   hip  . HERNIA REPAIR  [id number]   umbilical   . INGUINAL HERNIA REPAIR Right 05/13/2019   Procedure: RIGHT INGUINAL HERNIA REPAIR WITH MESH;  Surgeon: Coralie Keens, MD;  Location: [place];  Service: General;  Laterality: Right;  AND TAP BLOCK  . PELVIC FRACTURE SURGERY  1995  . TOTAL HIP ARTHROPLASTY Left 10/10/2014   dr Mardelle Matte  . TOTAL HIP ARTHROPLASTY Left 10/10/2014   Procedure: TOTAL HIP ARTHROPLASTY;  Surgeon: Johnny Bridge, MD;  Location: [place];  Service: Orthopedics;  Laterality: Left;   Allergies  Allergen Reactions  . Shellfish Allergy Anaphylaxis and Swelling    Throat swells closed," like there is a block of cement in my throat"   . Trazodone And Nefazodone     Jittery / shaking    Prior to Admission medications   Medication Sig Start Date End Date Taking? Authorizing Provider  albuterol (VENTOLIN HFA) 108 (90 Base) MCG/ACT inhaler Inhale 1-2 puffs into the lungs every 6 (six) hours as needed for wheezing or shortness of breath. Ok to fill with preferred pro air 07/13/20  Yes Wendie Agreste, MD  bimatoprost (LUMIGAN) 0.01 % SOLN Place 1 drop into both eyes at bedtime.    Yes [provider]  cetirizine (ZYRTEC) 10 MG tablet Take 1 tablet (10 mg total) by mouth daily. 07/13/20  Yes Wendie Agreste, MD  CINNAMON PO Take 1,000 mg by mouth daily.    Yes [provider]   Flaxseed, Linseed, (FLAXSEED OIL) OIL Take 2,400 mg by mouth.   Yes [provider]  fluticasone (FLOVENT HFA) 110 MCG/ACT inhaler Inhale 2 puffs into the lungs in the morning and at bedtime. 07/13/20  Yes Wendie Agreste, MD  Garlic 2778 MG CAPS Take 1,000 mg by mouth daily.    Yes [provider]  Melatonin 1 MG TABS Take 1 mg by mouth at bedtime as needed (for sleep).    Yes [provider]  Multiple Vitamins-Minerals (CENTRUM SILVER 50+MEN PO) Take by mouth.   Yes [provider]  Omega 3-6-9 Fatty Acids (OMEGA-3-6-9 PO) Take 2 capsules by mouth daily.   Yes [provider]  OVER THE COUNTER MEDICATION Place 1 Dose under the tongue 2 (two) times a day. CBD oil   Yes [provider]  RHOPRESSA 0.02 % SOLN  11/22/19  Yes [provider]  SIMBRINZA 1-0.2 % SUSP Place 1 drop into both eyes 3 (three) times daily.  02/05/19  Yes [provider]  tamsulosin (FLOMAX) 0.4 MG CAPS capsule Take 1 capsule (0.4 mg total) by mouth at bedtime. 07/13/20  Yes Wendie Agreste, MD  Turmeric Curcumin 500 MG CAPS Take 2 capsules by mouth daily.    Yes [provider]   Social History   Socioeconomic History  . Marital status: Single    Spouse name: Not on file  . Number of children: 1  . Years of education: Not on file  . Highest education level: Not on file  Occupational History  . Occupation: distribution    Employer: [employer]  Tobacco Use  . Smoking status: Never Smoker  . Smokeless tobacco: Never Used  Vaping Use  . Vaping Use: Never used  Substance and Sexual Activity  . Alcohol use: Yes    Alcohol/week: 0.0 standard drinks    Comment: occasional glass of wine  . Drug use: No  . Sexual activity: Yes  Other Topics Concern  . Not on file  Social History Narrative  . Not on file   Social Determinants of Health   Financial Resource Strain:   . Difficulty of Paying Living Expenses:   Food  Insecurity:   . Worried About Charity fundraiser in the Last Year:   . Arboriculturist in the Last Year:   Transportation Needs:   . Film/video editor (Medical):   Marland Kitchen Lack of Transportation (Non-Medical):   Physical Activity:   . Days of Exercise per Week:   . Minutes of Exercise per Session:   Stress:   .  Feeling of Stress :   Social Connections:   . Frequency of Communication with Friends and Family:   . Frequency of Social Gatherings with Friends and Family:   . Attends Religious Services:   . Active Member of Clubs or Organizations:   . Attends Archivist Meetings:   Marland Kitchen Marital Status:   Intimate Partner Violence:   . Fear of Current or Ex-Partner:   . Emotionally Abused:   Marland Kitchen Physically Abused:   . Sexually Abused:     Review of Systems Per HPI.   Objective:   Vitals:   08/15/20 0957  BP: 135/83  Pulse: 71  Temp: 98.3 F (36.8 C)  TempSrc: Temporal  SpO2: 96%  Weight: 177 lb (80.3 kg)  Height: 5\' 11"  (1.803 m)     Physical Exam Vitals reviewed.  Constitutional:      Appearance: He is well-developed.  HENT:     Head: Normocephalic and atraumatic.  Eyes:     Pupils: Pupils are equal, round, and reactive to light.  Neck:     Vascular: No carotid bruit or JVD.  Cardiovascular:     Rate and Rhythm: Normal rate and regular rhythm.     Heart sounds: Normal heart sounds. No murmur heard.   Pulmonary:     Effort: Pulmonary effort is normal.     Breath sounds: Normal breath sounds. No rales.  Skin:    General: Skin is warm and dry.  Neurological:     Mental Status: He is alert and oriented to person, place, and time.  Psychiatric:        Mood and Affect: Mood normal.        Behavior: Behavior normal.        Assessment & Plan:  RAFAL ARCHULETA is a 69 y.o. male . Nocturia Benign prostatic hyperplasia, unspecified whether lower urinary tract symptoms present Insomnia, unspecified type  -Possible component of BPH with nighttime  wakening.  Trial of Flomax 0.8 mg with possible side effects discussed.  If that dose works better can adjust prescription.  If no relief then follow-up with sleep specialist, he has number.  Mild intermittent asthma without complication  -Improved control at current dose of Flovent, continue same  No orders of the defined types were placed in this encounter.  Patient Instructions    Try higher dose of tamsulosin - 2 pills at bedtime to see if that helps sleep and frequency of nighttime urination. If new side effects, go back to 1. If not helping sleep - then follow up with sleep specialist. If 2 per day is better, let me know and I will send in a new prescription.   I'm glad to hear that asthma is better - no change in meds for now.    If you have lab work done today you will be contacted with your lab results within the next 2 weeks.  If you have not heard from [place] then please contact us. The fastest way to get your results is to register for My Chart.   IF you received an x-ray today, you will receive an invoice from [facility]. Please contact [facility] Radiology at [phone] with questions or concerns regarding your invoice.   IF you received labwork today, you will receive an invoice from [place]. Please contact [facility] at [phone] with questions or concerns regarding your invoice.   Our billing staff will not be able to assist you with questions regarding bills from these companies.  You will  be contacted with the lab results as soon as they are available. The fastest way to get your results is to activate your My Chart account. Instructions are located on the last page of this paperwork. If you have not heard from [place] regarding the results in 2 weeks, please contact this office.         Signed, Merri Ray, MD [facility]

## 2020-08-22 DIAGNOSIS — H401132 Primary open-angle glaucoma, bilateral, moderate stage: Secondary | ICD-10-CM | POA: Diagnosis not present

## 2020-08-22 DIAGNOSIS — H2513 Age-related nuclear cataract, bilateral: Secondary | ICD-10-CM | POA: Diagnosis not present

## 2020-08-30 DIAGNOSIS — Z23 Encounter for immunization: Secondary | ICD-10-CM | POA: Diagnosis not present

## 2020-09-28 DIAGNOSIS — M1712 Unilateral primary osteoarthritis, left knee: Secondary | ICD-10-CM | POA: Diagnosis not present

## 2020-10-02 ENCOUNTER — Telehealth: Payer: Self-pay | Admitting: *Deleted

## 2020-10-02 NOTE — Telephone Encounter (Signed)
Schedule AWV.  

## 2020-10-05 ENCOUNTER — Telehealth: Payer: Self-pay | Admitting: Family Medicine

## 2020-10-05 NOTE — Telephone Encounter (Signed)
Patient returned your call. He said as long as it is on Wednesday or Thursday/ ok to LEAVE detailed and let him know when it will be time doesn't matter prefers mornings. Because he states he is easy like Sunday Morning LOL you know Douglas Russell

## 2020-10-11 ENCOUNTER — Ambulatory Visit (INDEPENDENT_AMBULATORY_CARE_PROVIDER_SITE_OTHER): Payer: Medicare HMO | Admitting: Family Medicine

## 2020-10-11 VITALS — BP 135/83 | Ht 71.0 in | Wt 177.0 lb

## 2020-10-11 DIAGNOSIS — Z0000 Encounter for general adult medical examination without abnormal findings: Secondary | ICD-10-CM | POA: Diagnosis not present

## 2020-10-11 NOTE — Patient Instructions (Addendum)
Thank you for taking time to come for your Medicare Wellness Visit. I appreciate your ongoing commitment to your health goals. Please review the following plan we discussed and let me know if I can assist you in the future.  Douglas Kennedy LPN   Advance Directive  Advance directives are legal documents that let you make choices ahead of time about your health care and medical treatment in case you become unable to communicate for yourself. Advance directives are a way for you to make known your wishes to family, friends, and health care providers. This can let others know about your end-of-life care if you become unable to communicate. Discussing and writing advance directives should happen over time rather than all at once. Advance directives can be changed depending on your situation and what you want, even after you have signed the advance directives. There are different types of advance directives, such as:  Medical power of attorney.  Living will.  Do not resuscitate (DNR) or do not attempt resuscitation (DNAR) order. Health care proxy and medical power of attorney A health care proxy is also called a health care agent. This is a person who is appointed to make medical decisions for you in cases where you are unable to make the decisions yourself. Generally, people choose someone they know well and trust to represent their preferences. Make sure to ask this person for an agreement to act as your proxy. A proxy may have to exercise judgment in the event of a medical decision for which your wishes are not known. A medical power of attorney is a legal document that names your health care proxy. Depending on the laws in your state, after the document is written, it may also need to be:  Signed.  Notarized.  Dated.  Copied.  Witnessed.  Incorporated into your medical record. You may also want to appoint someone to manage your money in a situation in which you are unable to do so. This is  called a durable power of attorney for finances. It is a separate legal document from the durable power of attorney for health care. You may choose the same person or someone different from your health care proxy to act as your agent in money matters. If you do not appoint a proxy, or if there is a concern that the proxy is not acting in your best interests, a court may appoint a guardian to act on your behalf. Living will A living will is a set of instructions that state your wishes about medical care when you cannot express them yourself. Health care providers should keep a copy of your living will in your medical record. You may want to give a copy to family members or friends. To alert caregivers in case of an emergency, you can place a card in your wallet to let them know that you have a living will and where they can find it. A living will is used if you become:  Terminally ill.  Disabled.  Unable to communicate or make decisions. Items to consider in your living will include:  To use or not to use life-support equipment, such as dialysis machines and breathing machines (ventilators).  A DNR or DNAR order. This tells health care providers not to use cardiopulmonary resuscitation (CPR) if breathing or heartbeat stops.  To use or not to use tube feeding.  To be given or not to be given food and fluids.  Comfort (palliative) care when the goal becomes comfort rather  than a cure.  Donation of organs and tissues. A living will does not give instructions for distributing your money and property if you should pass away. DNR or DNAR A DNR or DNAR order is a request not to have CPR in the event that your heart stops beating or you stop breathing. If a DNR or DNAR order has not been made and shared, a health care provider will try to help any patient whose heart has stopped or who has stopped breathing. If you plan to have surgery, talk with your health care provider about how your DNR or DNAR  order will be followed if problems occur. What if I do not have an advance directive? If you do not have an advance directive, some states assign family decision makers to act on your behalf based on how closely you are related to them. Each state has its own laws about advance directives. You may want to check with your health care provider, attorney, or state representative about the laws in your state. Summary  Advance directives are the legal documents that allow you to make choices ahead of time about your health care and medical treatment in case you become unable to tell others about your care.  The process of discussing and writing advance directives should happen over time. You can change the advance directives, even after you have signed them.  Advance directives include DNR or DNAR orders, living wills, and designating an agent as your medical power of attorney. This information is not intended to replace advice given to you by your health care provider. Make sure you discuss any questions you have with your health care provider. Document Revised: 07/14/2019 Document Reviewed: 07/14/2019 Elsevier Patient Education  [name] 65 Years and Older, Male Preventive care refers to lifestyle choices and visits with your health care provider that can promote health and wellness. This includes:  A yearly physical exam. This is also called an annual well check.  Regular dental and eye exams.  Immunizations.  Screening for certain conditions.  Healthy lifestyle choices, such as diet and exercise. What can I expect for my preventive care visit? Physical exam Your health care provider will check:  Height and weight. These may be used to calculate body mass index (BMI), which is a measurement that tells if you are at a healthy weight.  Heart rate and blood pressure.  Your skin for abnormal spots. Counseling Your health care provider may ask you questions  about:  Alcohol, tobacco, and drug use.  Emotional well-being.  Home and relationship well-being.  Sexual activity.  Eating habits.  History of falls.  Memory and ability to understand (cognition).  Work and work Statistician. What immunizations do I need?  Influenza (flu) vaccine  This is recommended every year. Tetanus, diphtheria, and pertussis (Tdap) vaccine  You may need a Td booster every 10 years. Varicella (chickenpox) vaccine  You may need this vaccine if you have not already been vaccinated. Zoster (shingles) vaccine  You may need this after age 72. Pneumococcal conjugate (PCV13) vaccine  One dose is recommended after age 49. Pneumococcal polysaccharide (PPSV23) vaccine  One dose is recommended after age 60. Measles, mumps, and rubella (MMR) vaccine  You may need at least one dose of MMR if you were born in 1957 or later. You may also need a second dose. Meningococcal conjugate (MenACWY) vaccine  You may need this if you have certain conditions. Hepatitis A vaccine  You may  need this if you have certain conditions or if you travel or work in places where you may be exposed to hepatitis A. Hepatitis B vaccine  You may need this if you have certain conditions or if you travel or work in places where you may be exposed to hepatitis B. Haemophilus influenzae type b (Hib) vaccine  You may need this if you have certain conditions. You may receive vaccines as individual doses or as more than one vaccine together in one shot (combination vaccines). Talk with your health care provider about the risks and benefits of combination vaccines. What tests do I need? Blood tests  Lipid and cholesterol levels. These may be checked every 5 years, or more frequently depending on your overall health.  Hepatitis C test.  Hepatitis B test. Screening  Lung cancer screening. You may have this screening every year starting at age 74 if you have a 30-pack-year history of  smoking and currently smoke or have quit within the past 15 years.  Colorectal cancer screening. All adults should have this screening starting at age 67 and continuing until age 61. Your health care provider may recommend screening at age 27 if you are at increased risk. You will have tests every 1-10 years, depending on your results and the type of screening test.  Prostate cancer screening. Recommendations will vary depending on your family history and other risks.  Diabetes screening. This is done by checking your blood sugar (glucose) after you have not eaten for a while (fasting). You may have this done every 1-3 years.  Abdominal aortic aneurysm (AAA) screening. You may need this if you are a current or former smoker.  Sexually transmitted disease (STD) testing. Follow these instructions at home: Eating and drinking  Eat a diet that includes fresh fruits and vegetables, whole grains, lean protein, and low-fat dairy products. Limit your intake of foods with high amounts of sugar, saturated fats, and salt.  Take vitamin and mineral supplements as recommended by your health care provider.  Do not drink alcohol if your health care provider tells you not to drink.  If you drink alcohol: ? Limit how much you have to 0-2 drinks a day. ? Be aware of how much alcohol is in your drink. In the [place] one drink equals one 12 oz bottle of beer (355 mL), one 5 oz glass of wine (148 mL), or one 1 oz glass of hard liquor (44 mL). Lifestyle  Take daily care of your teeth and gums.  Stay active. Exercise for at least 30 minutes on 5 or more days each week.  Do not use any products that contain nicotine or tobacco, such as cigarettes, e-cigarettes, and chewing tobacco. If you need help quitting, ask your health care provider.  If you are sexually active, practice safe sex. Use a condom or other form of protection to prevent STIs (sexually transmitted infections).  Talk with your health care  provider about taking a low-dose aspirin or statin. What's next?  Visit your health care provider once a year for a well check visit.  Ask your health care provider how often you should have your eyes and teeth checked.  Stay up to date on all vaccines. This information is not intended to replace advice given to you by your health care provider. Make sure you discuss any questions you have with your health care provider. Document Revised: 12/09/2018 Document Reviewed: 12/09/2018 Elsevier Patient Education  2020 [employer].

## 2020-10-11 NOTE — Progress Notes (Signed)
Presents today for [employer] Visit   Date of last exam: 08/15/2020  Interpreter used for this visit? No  I connected with  Buckner Malta on 10/11/20 by a telephone  and verified that I am speaking with the correct person using two identifiers.   I discussed the limitations of evaluation and management by telemedicine. The patient expressed understanding and agreed to proceed.  Patient location: home  Provider location: in office  I provided 20 minutes of non face - to - face time during this encounter.   Patient Care Team: Wendie Agreste, MD as PCP - General (Family Medicine)   Other items to address today:   Discussed Eye/Dental Discussed immunizations Follow up Dr. Carlota Raspberry 2/23 @ 9:00am    Other Screening: Last screening for diabetes: 07-13-2020 Last lipid screening: 07/13/2020  ADVANCE DIRECTIVES: Discussed: yes On File: no Materials Provided: yes  Immunization status:  Immunization History  Administered Date(s) Administered  . Influenza Split 09/28/2012  . Influenza, High Dose Seasonal PF 09/12/2018  . Influenza, Seasonal, Injecte, Preservative Fre 09/23/2013  . Influenza,inj,Quad PF,6+ Mos 09/08/2014, 09/20/2015  . Influenza-Unspecified 08/31/2017, 09/26/2018, 09/08/2019  . Moderna SARS-COVID-2 Vaccination 02/05/2020, 03/21/2020  . Pneumococcal Conjugate-13 09/20/2015  . Pneumococcal Polysaccharide-23 09/23/2013, 11/01/2018  . Tdap 09/23/2013  . Zoster 05/29/2010  . Zoster Recombinat (Shingrix) 08/31/2017     There are no preventive care reminders to display for this patient.   Functional Status Survey: Is the patient deaf or have difficulty hearing?: No Does the patient have difficulty seeing, even when wearing glasses/contacts?: No Does the patient have difficulty concentrating, remembering, or making decisions?: No Does the patient have difficulty walking or climbing stairs?: No Does the patient have difficulty dressing  or bathing?: No Does the patient have difficulty doing errands alone such as visiting a doctor's office or shopping?: No   6CIT Screen 10/11/2020 07/13/2019  What Year? 0 points 0 points  What month? 0 points 0 points  What time? 0 points 0 points  Count back from 20 0 points 0 points  Months in reverse 0 points 0 points  Repeat phrase 0 points 0 points  Total Score 0 0        Clinical Support from 10/11/2020 in [place] [facility] [place]  AUDIT-C Score 4       Home Environment:    Works full-time at [employer] story home No trouble climbing stairs goes slow/ is waiting to have injection in knee No scattered rugs No grab bars Adequate lighting/ no clutter  Patient Active Problem List   Diagnosis Date Noted  . Hx of adenomatous polyp of colon 01/25/2015  . Status post left hip replacement 10/13/2014  . Asthma, chronic 10/13/2014  . Osteoarthritis of left hip 10/10/2014  . Hip arthritis 10/10/2014  . History of asthma 08/08/2014  . History of umbilical hernia 10/14/5101  . History of pelvic fracture 08/08/2014     Past Medical History:  Diagnosis Date  . Arthritis    L&R hip & pelvis   . Asthma    has had since he was a child, states he only uses the inhaler PRN  . Asthma    Phreesia 10/09/2020  . Cataract   . Family history of adverse reaction to anesthesia    Pt. not exactly sure-sister"they almost lost her"during  a surgery  . Glaucoma   . Glaucoma    both eyes  . History of blood transfusion    /  w pelvic fx.   Marland Kitchen Hx of adenomatous polyp of colon 01/25/2015  . Osteoarthritis of left hip 10/10/2014  . Pelvic fracture ([place]) 09/28/1994  . Pneumonia    in hosp[facility] ( /w pelvic fx)-      Past Surgical History:  Procedure Laterality Date  . back cyst  2006   surgery x4 for cyst on the back   . EYE SURGERY Bilateral March 2016   Dr. Marylynn Pearson, [place]  . FRACTURE SURGERY Right 1995   hip  . HERNIA REPAIR  [id number]   umbilical   .  INGUINAL HERNIA REPAIR Right 05/13/2019   Procedure: RIGHT INGUINAL HERNIA REPAIR WITH MESH;  Surgeon: Coralie Keens, MD;  Location: [facility];  Service: General;  Laterality: Right;  AND TAP BLOCK  . JOINT REPLACEMENT N/A    [facility]reesia 10/09/2020  . PELVIC FRACTURE SURGERY  1995  . TOTAL HIP ARTHROPLASTY Left 10/10/2014   dr Mardelle Matte  . TOTAL HIP ARTHROPLASTY Left 10/10/2014   Procedure: TOTAL HIP ARTHROPLASTY;  Surgeon: Johnny Bridge, MD;  Location: [facility];  Service: Orthopedics;  Laterality: Left;     Family History  Problem Relation Age of Onset  . Hypertension Mother   . Diabetes Father   . Hypertension Sister   . Cancer Maternal Grandmother   . Hypertension Sister   . Colon cancer Neg Hx      Social History   Socioeconomic History  . Marital status: Single    Spouse name: Not on file  . Number of children: 1  . Years of education: Not on file  . Highest education level: Not on file  Occupational History  . Occupation: distribution    Employer: [employer]  Tobacco Use  . Smoking status: Never Smoker  . Smokeless tobacco: Never Used  Vaping Use  . Vaping Use: Never used  Substance and Sexual Activity  . Alcohol use: Yes    Alcohol/week: 0.0 standard drinks    Comment: occasional glass of wine  . Drug use: No  . Sexual activity: Yes  Other Topics Concern  . Not on file  Social History Narrative  . Not on file   Social Determinants of Health   Financial Resource Strain:   . Difficulty of Paying Living Expenses: Not on file  Food Insecurity:   . Worried About Charity fundraiser in the Last Year: Not on file  . Ran Out of Food in the Last Year: Not on file  Transportation Needs:   . Lack of Transportation (Medical): Not on file  . Lack of Transportation (Non-Medical): Not on file  Physical Activity:   . Days of Exercise per Week: Not on file  . Minutes of Exercise per Session: Not on file  Stress:   . Feeling of Stress : Not on file  Social  Connections:   . Frequency of Communication with Friends and Family: Not on file  . Frequency of Social Gatherings with Friends and Family: Not on file  . Attends Religious Services: Not on file  . Active Member of Clubs or Organizations: Not on file  . Attends Archivist Meetings: Not on file  . Marital Status: Not on file  Intimate Partner Violence:   . Fear of Current or Ex-Partner: Not on file  . Emotionally Abused: Not on file  . Physically Abused: Not on file  . Sexually Abused: Not on file     Allergies  Allergen Reactions  . Other Hives and Shortness  Of Breath  . Shellfish Allergy Anaphylaxis and Swelling    Throat swells closed," like there is a block of cement in my throat"   . Trazodone And Nefazodone     Jittery / shaking      Prior to Admission medications   Medication Sig Start Date End Date Taking? Authorizing Provider  albuterol (VENTOLIN HFA) 108 (90 Base) MCG/ACT inhaler Inhale 1-2 puffs into the lungs every 6 (six) hours as needed for wheezing or shortness of breath. Ok to fill with preferred pro air 07/13/20  Yes Wendie Agreste, MD  bimatoprost (LUMIGAN) 0.01 % SOLN Place 1 drop into both eyes at bedtime.    Yes [provider]  cetirizine (ZYRTEC) 10 MG tablet Take 1 tablet (10 mg total) by mouth daily. 07/13/20  Yes Wendie Agreste, MD  CINNAMON PO Take 1,000 mg by mouth daily.    Yes [provider]  Flaxseed, Linseed, (FLAXSEED OIL) OIL Take 2,400 mg by mouth.   Yes [provider]  fluticasone (FLOVENT HFA) 110 MCG/ACT inhaler Inhale 2 puffs into the lungs in the morning and at bedtime. 07/13/20  Yes Wendie Agreste, MD  Garlic 4709 MG CAPS Take 1,000 mg by mouth daily.    Yes [provider]  Melatonin 1 MG TABS Take 1 mg by mouth at bedtime as needed (for sleep).    Yes [provider]  Multiple Vitamins-Minerals (CENTRUM SILVER 50+MEN PO) Take by mouth.   Yes [provider]  Omega  3-6-9 Fatty Acids (OMEGA-3-6-9 PO) Take 2 capsules by mouth daily.   Yes [provider]  OVER THE COUNTER MEDICATION Place 1 Dose under the tongue 2 (two) times a day. CBD oil   Yes [provider]  RHOPRESSA 0.02 % SOLN  11/22/19  Yes [provider]  SIMBRINZA 1-0.2 % SUSP Place 1 drop into both eyes 3 (three) times daily.  02/05/19  Yes [provider]  tamsulosin (FLOMAX) 0.4 MG CAPS capsule Take 1 capsule (0.4 mg total) by mouth at bedtime. 07/13/20  Yes Wendie Agreste, MD  Turmeric Curcumin 500 MG CAPS Take 2 capsules by mouth daily.    Yes [provider]     Depression screen [facility] 2/9 10/11/2020 08/15/2020 07/13/2020 01/13/2020 07/13/2019  Decreased Interest 0 0 0 0 0  Down, Depressed, Hopeless 0 0 0 0 0  PHQ - 2 Score 0 0 0 0 0     Fall Risk  10/11/2020 08/15/2020 07/13/2020 01/13/2020 07/13/2019  Falls in the past year? 0 0 0 1 0  Comment - - - - -  Number falls in past yr: 0 - - 0 0  Injury with Fall? 0 - - 1 0  Follow up Falls evaluation completed;Education provided Falls evaluation completed Falls evaluation completed - Falls evaluation completed      PHYSICAL EXAM: BP 135/83 Comment: taken from a previous visit/ not in clinic  Ht 5\' 11"  (1.803 m)   Wt 177 lb (80.3 kg)   BMI 24.69 kg/m    Wt Readings from Last 3 Encounters:  10/11/20 177 lb (80.3 kg)  08/15/20 177 lb (80.3 kg)  07/13/20 174 lb (78.9 kg)       Education/Counseling provided regarding diet and exercise, prevention of chronic diseases, smoking/tobacco cessation, if applicable, and reviewed "Covered Medicare Preventive Services."

## 2020-11-14 DIAGNOSIS — H401132 Primary open-angle glaucoma, bilateral, moderate stage: Secondary | ICD-10-CM | POA: Diagnosis not present

## 2020-11-14 DIAGNOSIS — H2513 Age-related nuclear cataract, bilateral: Secondary | ICD-10-CM | POA: Diagnosis not present

## 2020-11-29 ENCOUNTER — Encounter: Payer: Self-pay | Admitting: Family Medicine

## 2021-01-07 IMAGING — DX DG KNEE COMPLETE 4+V*L*
4 series · 4 of 4 positions shown · non-contrast
Comparison: none

CLINICAL DATA: Left knee pain for 6 weeks.  No known injury.

EXAM:
LEFT KNEE - COMPLETE 4+ VIEW

[knee ap]
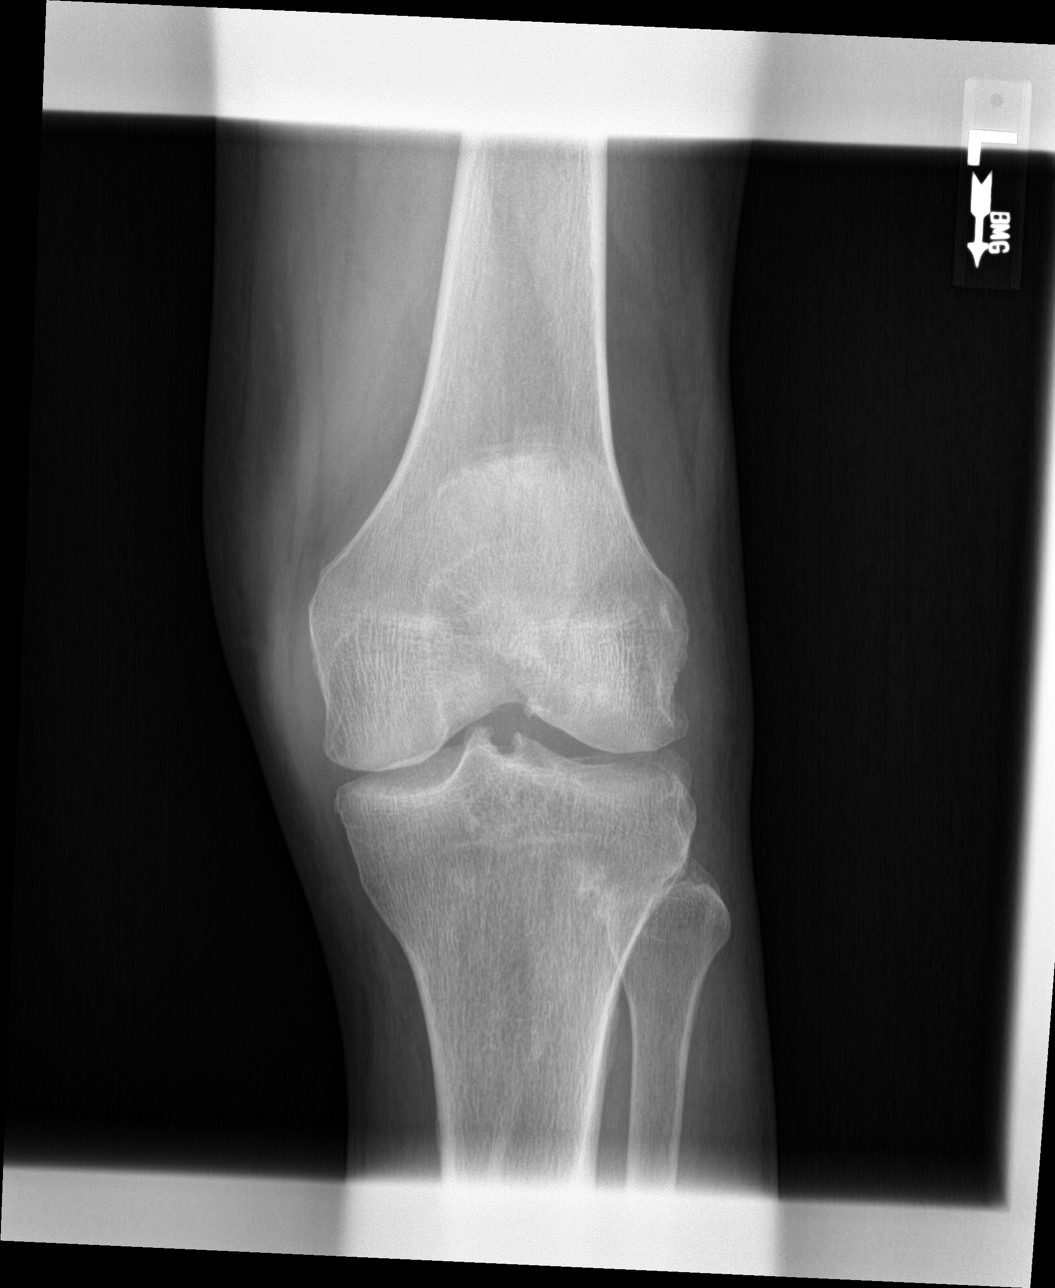

[knee lat]
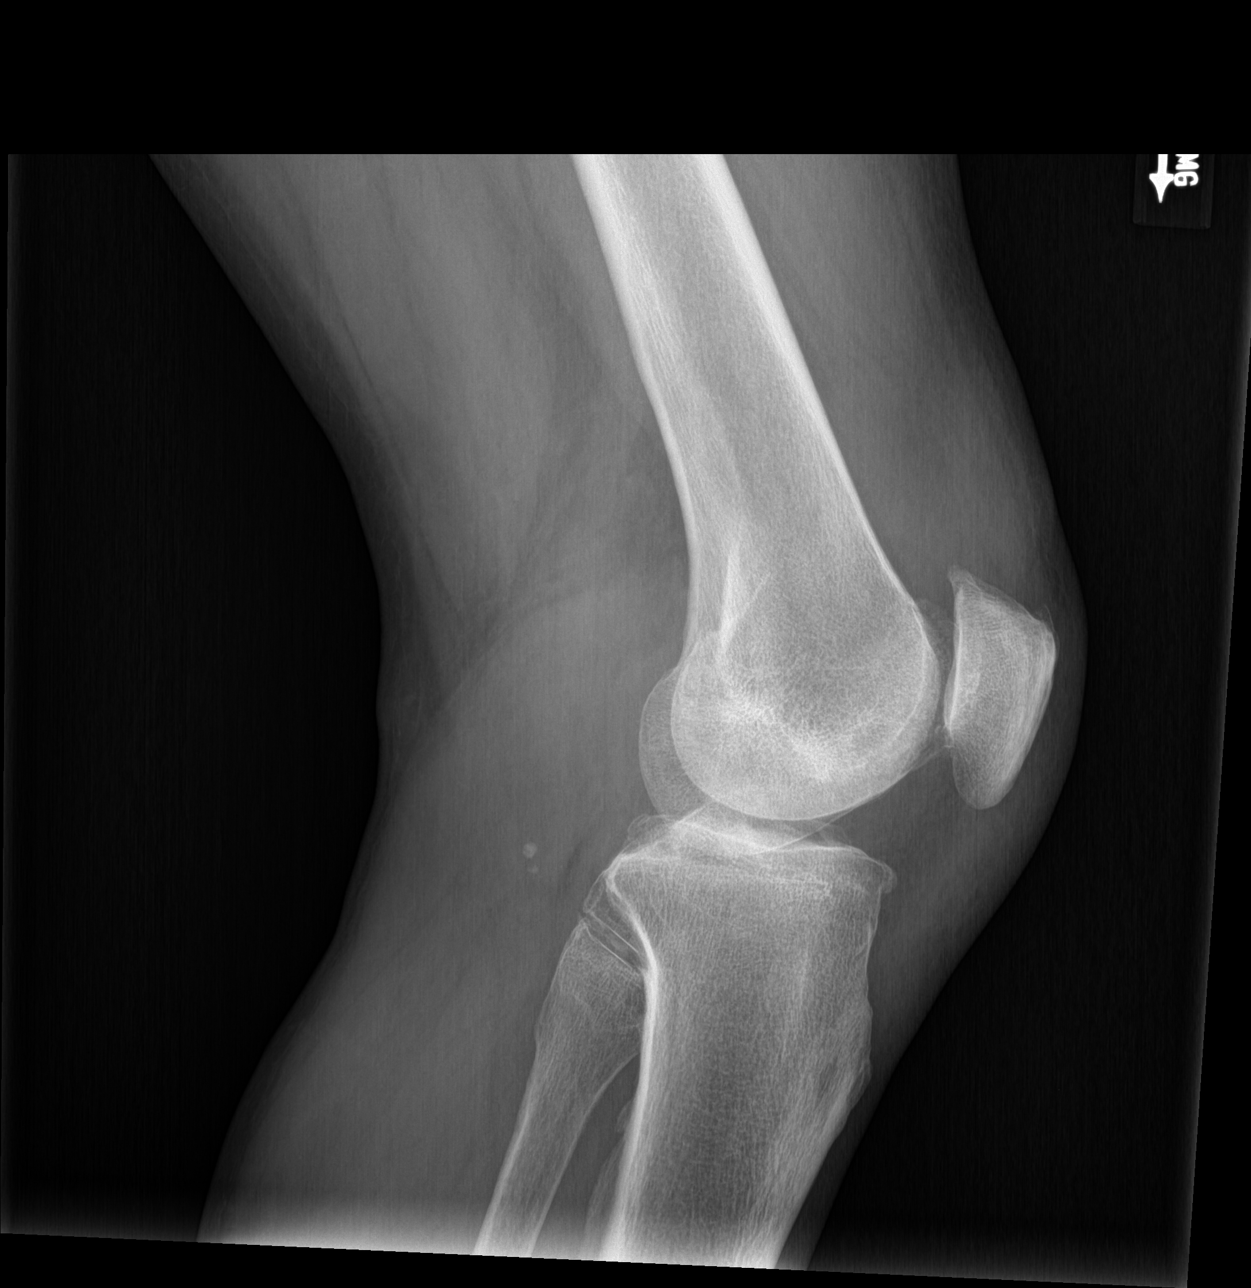

[sunrise]
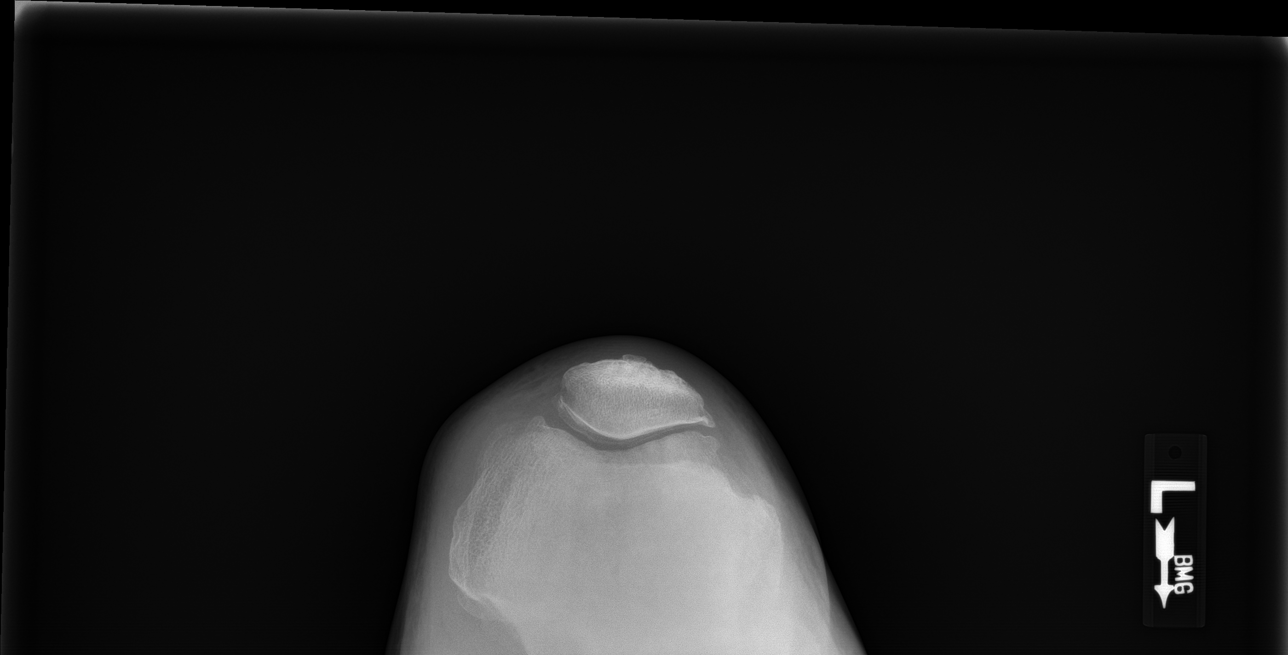

[knee [person_name]]
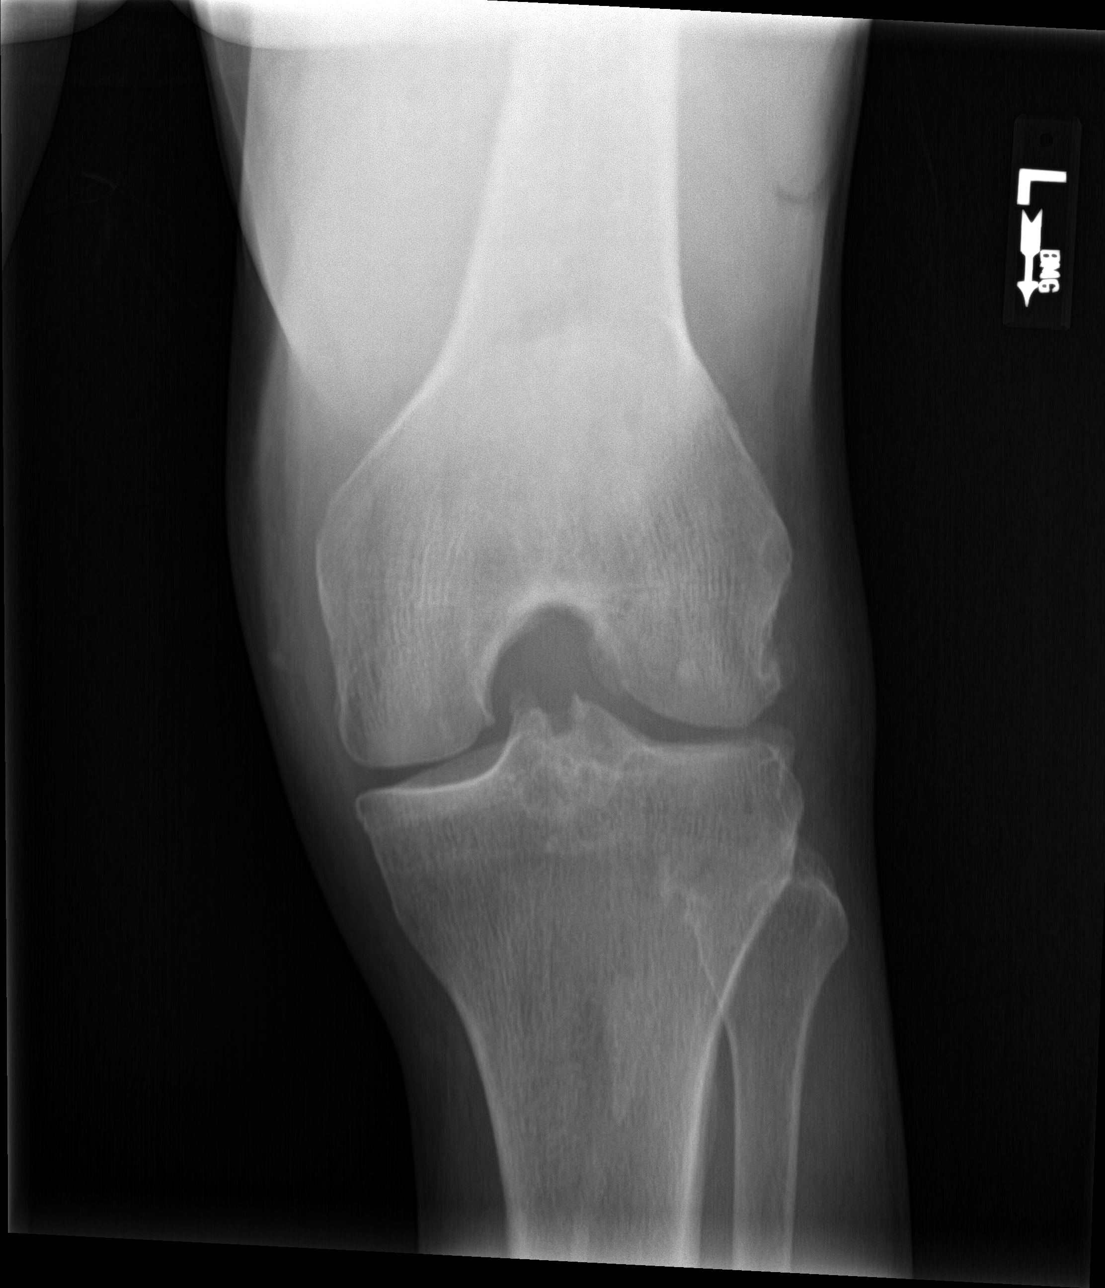

[4 of 4 positions shown; findings below may reference images not displayed]

FINDINGS: Diffuse tricompartment degenerative change. No acute bony
abnormality identified. No evidence of fracture or dislocation.
Small knee joint effusion cannot be excluded.
IMPRESSION: Diffuse tricompartment degenerative change. No acute bony
abnormality. Small knee joint effusion cannot be excluded.

## 2021-01-11 ENCOUNTER — Encounter: Payer: Self-pay | Admitting: Family Medicine

## 2021-02-20 ENCOUNTER — Encounter: Payer: Self-pay | Admitting: Family Medicine

## 2021-02-20 ENCOUNTER — Ambulatory Visit (INDEPENDENT_AMBULATORY_CARE_PROVIDER_SITE_OTHER): Payer: Medicare HMO | Admitting: Family Medicine

## 2021-02-20 ENCOUNTER — Other Ambulatory Visit: Payer: Self-pay

## 2021-02-20 VITALS — BP 130/86 | HR 74 | Temp 97.8°F | Ht 71.0 in | Wt 180.0 lb

## 2021-02-20 DIAGNOSIS — G4700 Insomnia, unspecified: Secondary | ICD-10-CM

## 2021-02-20 DIAGNOSIS — E785 Hyperlipidemia, unspecified: Secondary | ICD-10-CM

## 2021-02-20 DIAGNOSIS — R351 Nocturia: Secondary | ICD-10-CM | POA: Diagnosis not present

## 2021-02-20 DIAGNOSIS — J4520 Mild intermittent asthma, uncomplicated: Secondary | ICD-10-CM

## 2021-02-20 DIAGNOSIS — N400 Enlarged prostate without lower urinary tract symptoms: Secondary | ICD-10-CM

## 2021-02-20 MED ORDER — ALBUTEROL SULFATE HFA 108 (90 BASE) MCG/ACT IN AERS: 1.0000 | INHALATION_SPRAY | Freq: Four times a day (QID) | RESPIRATORY_TRACT | 1 refills | Status: DC | PRN

## 2021-02-20 MED ORDER — HYDROXYZINE HCL 25 MG PO TABS: 12.5000 mg | ORAL_TABLET | Freq: Every evening | ORAL | 0 refills | Status: DC | PRN

## 2021-02-20 MED ORDER — FLUTICASONE-SALMETEROL 250-50 MCG/DOSE IN AEPB: 1.0000 | INHALATION_SPRAY | Freq: Two times a day (BID) | RESPIRATORY_TRACT | 5 refills | Status: DC

## 2021-02-20 NOTE — Patient Instructions (Addendum)
Try low dose hydroxyzine to help with sleep onset.  I will refer you to urology, but if continued wakening once nighttime urination improves would recommend meeting with sleep specialist. Continue flomax 0.8mg  qd.   Change flovent to advair for now. Ok to use albuterol if needed still, but should not need as much.   Return to the clinic or go to the nearest emergency room if any of your symptoms worsen or new symptoms occur.  If you have lab work done today you will be contacted with your lab results within the next 2 weeks.  If you have not heard from [place] then please contact us. The fastest way to get your results is to register for My Chart.   IF you received an x-ray today, you will receive an invoice from [facility]. Please contact [facility] at [phone] with questions or concerns regarding your invoice.   IF you received labwork today, you will receive an invoice from [facility]. Please contact [facility] at [phone] with questions or concerns regarding your invoice.   Our billing staff will not be able to assist you with questions regarding bills from these companies.  You will be contacted with the lab results as soon as they are available. The fastest way to get your results is to activate your My Chart account. Instructions are located on the last page of this paperwork. If you have not heard from [place] regarding the results in 2 weeks, please contact this office.

## 2021-02-20 NOTE — Progress Notes (Signed)
Subjective:  Patient ID: Douglas Russell, male    DOB: 1951/09/26  Age: 70 y.o. MRN: [id number]  CC:  Chief Complaint  Patient presents with  . Follow-up    On Insomnia,Nocturia, asthma and hyperlipidemia. PT reports he hasn't seen urology since last OV. PT reports still having issues getting and staying asleep since last OV. PT reports having issues with his asthma since January.    HPI Douglas Russell presents for   Nocturia: Discussed in August 2021, BPH. Thought to be contributing to insomnia at that time.  Recommended follow-up with urology.  Flomax was increased to 0.8 mg.  Still on same dose. Nocturia at least 3 times per night. No dysuria. Hematuria. No hesitancy/urgency. Has not seen urology - Dr. Alyson Ingles.   Insomnia Last discussed in August.  Frequent wakening.  Minimal relief with Ambien, Seroquel, herbal medications.  Previous sleep study 1996 with nonrestorative light stage sleep.  He has been referred to [facility] sleep center, with multiple attempts at contacting patient in 2019.  Was not evaluated. Still having difficulty getting to sleep and staying asleep. Taking sleepzine nightly - wholistic med with melatonin. Some frequent wakening at night - usually to urinate. Mind races some at night.  No afternoon caffeine.  Avoiding fluids 2 hrs prior to bedtime. Rare glass wine hours prior to bedtime.   Asthma - previously mild intermittent Flovent had been increased to 110 mcg 2 puffs twice per daily prior to August visit was doing well at that time without breakthrough wheeze.  Continued on albuterol as needed, Zyrtec for allergies. Flair of asthma earlier in the month. Using albuterol nightly past few weeks. Still on flovent 2 puffs BID. Still needing albuterol at night.  Dust at work seems to be flare No fever, CP , or leg swelling.   Hyperlipidemia: Mild elevation in July of last year.  No current statin.  Is taking flaxseed, omega-3, garlic supplements.  Not fasting  today - ate 3.5 hrs ago.  Lab Results  Component Value Date   CHOL 195 07/13/2020   HDL 78 07/13/2020   LDLCALC 105 (H) 07/13/2020   LDLDIRECT 113 (H) 05/11/2015   TRIG 64 07/13/2020   CHOLHDL 2.5 07/13/2020   Lab Results  Component Value Date   ALT 16 07/13/2020   AST 16 07/13/2020   ALKPHOS 83 07/13/2020   BILITOT 0.7 07/13/2020        History Patient Active Problem List   Diagnosis Date Noted  . Hx of adenomatous polyp of colon 01/25/2015  . Status post left hip replacement 10/13/2014  . Asthma, chronic 10/13/2014  . Osteoarthritis of left hip 10/10/2014  . Hip arthritis 10/10/2014  . History of asthma 08/08/2014  . History of umbilical hernia 95/28/4132  . History of pelvic fracture 08/08/2014   Past Medical History:  Diagnosis Date  . Arthritis    L&R hip & pelvis   . Asthma    has had since he was a child, states he only uses the inhaler PRN  . Asthma    Phreesia 10/09/2020  . Cataract   . Family history of adverse reaction to anesthesia    Pt. not exactly sure-sister"they almost lost her"during  a surgery  . Glaucoma   . Glaucoma    both eyes  . History of blood transfusion    /w pelvic fx.   Marland Kitchen Hx of adenomatous polyp of colon 01/25/2015  . Osteoarthritis of left hip 10/10/2014  . Pelvic fracture ([place]) 09/28/1994  .  Pneumonia    in hosp.,[facility] ( /w pelvic fx)-    Past Surgical History:  Procedure Laterality Date  . back cyst  2006   surgery x4 for cyst on the back   . EYE SURGERY Bilateral March 2016   Dr. Marylynn Pearson, [place]  . FRACTURE SURGERY Right 1995   hip  . HERNIA REPAIR  7169   umbilical   . INGUINAL HERNIA REPAIR Right 05/13/2019   Procedure: RIGHT INGUINAL HERNIA REPAIR WITH MESH;  Surgeon: Coralie Keens, MD;  Location: [place];  Service: General;  Laterality: Right;  AND TAP BLOCK  . JOINT REPLACEMENT N/A    Phreesia 10/09/2020  . PELVIC FRACTURE SURGERY  1995  . TOTAL HIP ARTHROPLASTY Left 10/10/2014   dr Mardelle Matte  . TOTAL HIP  ARTHROPLASTY Left 10/10/2014   Procedure: TOTAL HIP ARTHROPLASTY;  Surgeon: Johnny Bridge, MD;  Location: [place];  Service: Orthopedics;  Laterality: Left;   Allergies  Allergen Reactions  . Other Hives and Shortness Of Breath  . Shellfish Allergy Anaphylaxis and Swelling    Throat swells closed," like there is a block of cement in my throat"   . Trazodone And Nefazodone     Jittery / shaking    Prior to Admission medications   Medication Sig Start Date End Date Taking? Authorizing Provider  albuterol (VENTOLIN HFA) 108 (90 Base) MCG/ACT inhaler Inhale 1-2 puffs into the lungs every 6 (six) hours as needed for wheezing or shortness of breath. Ok to fill with preferred pro air 07/13/20  Yes Wendie Agreste, MD  bimatoprost (LUMIGAN) 0.01 % SOLN Place 1 drop into both eyes at bedtime.    Yes [provider]  cetirizine (ZYRTEC) 10 MG tablet Take 1 tablet (10 mg total) by mouth daily. 07/13/20  Yes Wendie Agreste, MD  CINNAMON PO Take 1,000 mg by mouth daily.    Yes [provider]  Flaxseed, Linseed, (FLAXSEED OIL) OIL Take 2,400 mg by mouth.   Yes [provider]  fluticasone (FLOVENT HFA) 110 MCG/ACT inhaler Inhale 2 puffs into the lungs in the morning and at bedtime. 07/13/20  Yes Wendie Agreste, MD  Garlic 6789 MG CAPS Take 1,000 mg by mouth daily.    Yes [provider]  Melatonin 1 MG TABS Take 1 mg by mouth at bedtime as needed (for sleep).    Yes [provider]  Multiple Vitamins-Minerals (CENTRUM SILVER 50+MEN PO) Take by mouth.   Yes [provider]  Omega 3-6-9 Fatty Acids (OMEGA-3-6-9 PO) Take 2 capsules by mouth daily.   Yes [provider]  OVER THE COUNTER MEDICATION Place 1 Dose under the tongue 2 (two) times a day. CBD oil   Yes [provider]  RHOPRESSA 0.02 % SOLN  11/22/19  Yes [provider]  SIMBRINZA 1-0.2 % SUSP Place 1 drop into both eyes 3 (three) times daily.  02/05/19  Yes  [provider]  tamsulosin (FLOMAX) 0.4 MG CAPS capsule Take 1 capsule (0.4 mg total) by mouth at bedtime. 07/13/20  Yes Wendie Agreste, MD  Turmeric Curcumin 500 MG CAPS Take 2 capsules by mouth daily.    Yes [provider]   Social History   Socioeconomic History  . Marital status: Single    Spouse name: Not on file  . Number of children: 1  . Years of education: Not on file  . Highest education level: Not on file  Occupational History  . Occupation: distribution  Employer: [employer]  Tobacco Use  . Smoking status: Never Smoker  . Smokeless tobacco: Never Used  Vaping Use  . Vaping Use: Never used  Substance and Sexual Activity  . Alcohol use: Yes    Alcohol/week: 0.0 standard drinks    Comment: occasional glass of wine  . Drug use: No  . Sexual activity: Yes  Other Topics Concern  . Not on file  Social History Narrative  . Not on file   Social Determinants of Health   Financial Resource Strain: Not on file  Food Insecurity: Not on file  Transportation Needs: Not on file  Physical Activity: Not on file  Stress: Not on file  Social Connections: Not on file  Intimate Partner Violence: Not on file    Review of Systems   Objective:   Vitals:   02/20/21 0915 02/20/21 0918  BP: (!) 154/84 130/86  Pulse: 74   Temp: 97.8 F (36.6 C)   TempSrc: Temporal   SpO2: 94%   Weight: 180 lb (81.6 kg)   Height: 5\' 11"  (1.803 m)      Physical Exam Vitals reviewed.  Constitutional:      Appearance: He is well-developed and well-nourished.  HENT:     Head: Normocephalic and atraumatic.  Eyes:     Extraocular Movements: EOM normal.     Pupils: Pupils are equal, round, and reactive to light.  Neck:     Vascular: No carotid bruit or JVD.  Cardiovascular:     Rate and Rhythm: Normal rate and regular rhythm.     Heart sounds: Normal heart sounds. No murmur heard.   Pulmonary:     Effort: Pulmonary effort is normal. No respiratory  distress.     Breath sounds: Normal breath sounds. No stridor. No rhonchi or rales.  Musculoskeletal:        General: No edema.     Right lower leg: No edema.     Left lower leg: No edema.  Skin:    General: Skin is warm and dry.  Neurological:     Mental Status: He is alert and oriented to person, place, and time.  Psychiatric:        Mood and Affect: Mood and affect normal.     35 minutes spent during visit, greater than 50% counseling and assimilation of information, chart review, and discussion of plan.   Assessment & Plan:  Douglas Russell is a 70 y.o. male . Hyperlipidemia, unspecified hyperlipidemia type - Plan: Lipid panel, Comprehensive metabolic panel  -Check labs, may need to be repeated after 8 hours fasting if elevated.   Nocturia - Plan: Comprehensive metabolic panel, Ambulatory referral to Urology Benign prostatic hyperplasia, unspecified whether lower urinary tract symptoms present - Plan: Ambulatory referral to Urology  -Minimal improvement with higher dose Flomax.  Refer to his urologist to discuss other evaluation/treatment options.  Likely contributing to insomnia.  Insomnia, unspecified type - Plan: hydrOXYzine (ATARAX/VISTARIL) 25 MG tablet  -As above likely component of nocturia/BPH as cause.  Will initially refer to urology.  Consider sleep special evaluation.  Hydroxyzine 12.5 mg nightly as needed with potential side effects and risk discussed.  Recheck 3 months  Mild intermittent asthma without complication - Plan: Fluticasone-Salmeterol (ADVAIR DISKUS) 250-50 MCG/DOSE AEPB, albuterol (VENTOLIN HFA) 108 (90 Base) MCG/ACT inhaler  -Decreased control, change Flovent to Advair, albuterol if needed.  RTC precautions, 77-month follow-up  Meds ordered this encounter  Medications  . hydrOXYzine (ATARAX/VISTARIL) 25 MG tablet  Sig: Take 0.5 tablets (12.5 mg total) by mouth at bedtime as needed for anxiety (or sleep.).    Dispense:  30 tablet    Refill:  0  .  Fluticasone-Salmeterol (ADVAIR DISKUS) 250-50 MCG/DOSE AEPB    Sig: Inhale 1 puff into the lungs 2 (two) times daily.    Dispense:  1 each    Refill:  5  . albuterol (VENTOLIN HFA) 108 (90 Base) MCG/ACT inhaler    Sig: Inhale 1-2 puffs into the lungs every 6 (six) hours as needed for wheezing or shortness of breath. Ok to fill with preferred pro air    Dispense:  18 g    Refill:  1   Patient Instructions   Try low dose hydroxyzine to help with sleep onset.  I will refer you to urology, but if continued wakening once nighttime urination improves would recommend meeting with sleep specialist. Continue flomax 0.8mg  qd.   Change flovent to advair for now. Ok to use albuterol if needed still, but should not need as much.   Return to the clinic or go to the nearest emergency room if any of your symptoms worsen or new symptoms occur.  If you have lab work done today you will be contacted with your lab results within the next 2 weeks.  If you have not heard from [place] then please contact us. The fastest way to get your results is to register for My Chart.   IF you received an x-ray today, you will receive an invoice from [facility]. Please contact [facility] at [phone] with questions or concerns regarding your invoice.   IF you received labwork today, you will receive an invoice from [facility]. Please contact [facility] at [phone] with questions or concerns regarding your invoice.   Our billing staff will not be able to assist you with questions regarding bills from these companies.  You will be contacted with the lab results as soon as they are available. The fastest way to get your results is to activate your My Chart account. Instructions are located on the last page of this paperwork. If you have not heard from [place] regarding the results in 2 weeks, please contact this office.         Signed, Merri Ray, MD [facility]

## 2021-02-21 LAB — COMPREHENSIVE METABOLIC PANEL
ALT: 24 IU/L (ref 0–44)
AST: 26 IU/L (ref 0–40)
Albumin/Globulin Ratio: 1.6 (ref 1.2–2.2)
Albumin: 4.2 g/dL (ref 3.7–4.7)
Alkaline Phosphatase: 98 IU/L (ref 44–121)
BUN/Creatinine Ratio: 20 (ref 10–24)
BUN: 19 mg/dL (ref 8–27)
Bilirubin Total: 0.6 mg/dL (ref 0.0–1.2)
CO2: 22 mmol/L (ref 20–29)
Calcium: 9.1 mg/dL (ref 8.6–10.2)
Chloride: 105 mmol/L (ref 96–106)
Creatinine, Ser: 0.93 mg/dL (ref 0.76–1.27)
GFR calc Af Amer: 94 mL/min/{1.73_m2} (ref >59)
GFR calc non Af Amer: 82 mL/min/{1.73_m2} (ref >59)
Globulin, Total: 2.7 g/dL (ref 1.5–4.5)
Glucose: 94 mg/dL (ref 65–99)
Potassium: 4.7 mmol/L (ref 3.5–5.2)
Sodium: 143 mmol/L (ref 134–144)
Total Protein: 6.9 g/dL (ref 6.0–8.5)

## 2021-02-21 LAB — LIPID PANEL
Chol/HDL Ratio: 2.7 ratio (ref 0.0–5.0)
Cholesterol, Total: 207 mg/dL — ABNORMAL HIGH (ref 100–199)
HDL: 77 mg/dL (ref >39)
LDL Chol Calc (NIH): 121 mg/dL — ABNORMAL HIGH (ref 0–99)
Triglycerides: 50 mg/dL (ref 0–149)
VLDL Cholesterol Cal: 9 mg/dL (ref 5–40)

## 2021-03-14 DIAGNOSIS — R351 Nocturia: Secondary | ICD-10-CM | POA: Diagnosis not present

## 2021-03-14 DIAGNOSIS — N401 Enlarged prostate with lower urinary tract symptoms: Secondary | ICD-10-CM | POA: Diagnosis not present

## 2021-03-14 DIAGNOSIS — Z125 Encounter for screening for malignant neoplasm of prostate: Secondary | ICD-10-CM | POA: Diagnosis not present

## 2021-04-17 DIAGNOSIS — M170 Bilateral primary osteoarthritis of knee: Secondary | ICD-10-CM | POA: Diagnosis not present

## 2021-04-24 DIAGNOSIS — M170 Bilateral primary osteoarthritis of knee: Secondary | ICD-10-CM | POA: Diagnosis not present

## 2021-05-01 DIAGNOSIS — M170 Bilateral primary osteoarthritis of knee: Secondary | ICD-10-CM | POA: Diagnosis not present

## 2021-05-22 ENCOUNTER — Ambulatory Visit (INDEPENDENT_AMBULATORY_CARE_PROVIDER_SITE_OTHER): Payer: Medicare HMO | Admitting: Family Medicine

## 2021-05-22 ENCOUNTER — Other Ambulatory Visit: Payer: Self-pay

## 2021-05-22 ENCOUNTER — Encounter: Payer: Self-pay | Admitting: Family Medicine

## 2021-05-22 VITALS — BP 122/74 | HR 62 | Temp 98.0°F | Resp 16 | Ht 71.0 in | Wt 187.6 lb

## 2021-05-22 DIAGNOSIS — R351 Nocturia: Secondary | ICD-10-CM | POA: Diagnosis not present

## 2021-05-22 DIAGNOSIS — J4520 Mild intermittent asthma, uncomplicated: Secondary | ICD-10-CM

## 2021-05-22 DIAGNOSIS — J309 Allergic rhinitis, unspecified: Secondary | ICD-10-CM | POA: Diagnosis not present

## 2021-05-22 DIAGNOSIS — H2513 Age-related nuclear cataract, bilateral: Secondary | ICD-10-CM | POA: Diagnosis not present

## 2021-05-22 DIAGNOSIS — H401132 Primary open-angle glaucoma, bilateral, moderate stage: Secondary | ICD-10-CM | POA: Diagnosis not present

## 2021-05-22 MED ORDER — CETIRIZINE HCL 10 MG PO TABS: 10.0000 mg | ORAL_TABLET | Freq: Every day | ORAL | 2 refills | Status: DC

## 2021-05-22 NOTE — Progress Notes (Signed)
Subjective:  Patient ID: Douglas Russell, male    DOB: 1951/04/15  Age: 70 y.o. MRN: [id number]  CC:  Chief Complaint  Patient presents with  . Follow-up    Pt reports taking flomax Hydrox together caused dizziness stopped taking both. Notes no improvement night time urination on flomax, pt reports urologist wanted him to continuue meds as they were despite no relief and interaction, F/u 1 year,  Asthma is well controlled no concerns    HPI Douglas Russell presents for   Nocturia with insomnia, discussed in February. Dizziness with hydroxyzine and Flomax.  Stopped hydroxyzine.  Continue on Flomax, followed by urology.  Notes with Dr. Abner Greenspan reviewed from March 17.  Offered Myrbetriq in addition to Flomax but was declined.  Continued Flomax 0.8 mg daily. Nighttime urination has persisted - 4 times per night. Feels about the same.  Had knee problems as well - followed by ortho for leg pains. Had some injections in knees recently - too early to tell how much relief.  Taking zyrtec for allergies.   Mild intermittent asthma: Decreased control in February.  Changed Flovent to Advair with albuterol as needed. Has been taking Advair BID, much improved. No wheezing, no nocturnal symptoms, no need for albuterol.   History Patient Active Problem List   Diagnosis Date Noted  . Hx of adenomatous polyp of colon 01/25/2015  . Status post left hip replacement 10/13/2014  . Asthma, chronic 10/13/2014  . Osteoarthritis of left hip 10/10/2014  . Hip arthritis 10/10/2014  . History of asthma 08/08/2014  . History of umbilical hernia 06/21/7627  . History of pelvic fracture 08/08/2014   Past Medical History:  Diagnosis Date  . Arthritis    L&R hip & pelvis   . Asthma    has had since he was a child, states he only uses the inhaler PRN  . Asthma    Phreesia 10/09/2020  . Cataract   . Family history of adverse reaction to anesthesia    Pt. not exactly sure-sister"they almost lost her"during  a  surgery  . Glaucoma   . Glaucoma    both eyes  . History of blood transfusion    /w pelvic fx.   Marland Kitchen Hx of adenomatous polyp of colon 01/25/2015  . Osteoarthritis of left hip 10/10/2014  . Pelvic fracture ([place]) 09/28/1994  . Pneumonia    in hosp[facility] ( /w pelvic fx)-    Past Surgical History:  Procedure Laterality Date  . back cyst  2006   surgery x4 for cyst on the back   . EYE SURGERY Bilateral March 2016   Dr. Marylynn Pearson, [place]  . FRACTURE SURGERY Right 1995   hip  . HERNIA REPAIR  3151   umbilical   . INGUINAL HERNIA REPAIR Right 05/13/2019   Procedure: RIGHT INGUINAL HERNIA REPAIR WITH MESH;  Surgeon: Coralie Keens, MD;  Location: [place];  Service: General;  Laterality: Right;  AND TAP BLOCK  . JOINT REPLACEMENT N/A    Phreesia 10/09/2020  . PELVIC FRACTURE SURGERY  1995  . TOTAL HIP ARTHROPLASTY Left 10/10/2014   dr Mardelle Matte  . TOTAL HIP ARTHROPLASTY Left 10/10/2014   Procedure: TOTAL HIP ARTHROPLASTY;  Surgeon: Johnny Bridge, MD;  Location: [place];  Service: Orthopedics;  Laterality: Left;   Allergies  Allergen Reactions  . Other Hives and Shortness Of Breath  . Shellfish Allergy Anaphylaxis and Swelling    Throat swells closed," like there is a block of cement in  my throat"   . Trazodone And Nefazodone     Jittery / shaking    Prior to Admission medications   Medication Sig Start Date End Date Taking? Authorizing Provider  albuterol (VENTOLIN HFA) 108 (90 Base) MCG/ACT inhaler Inhale 1-2 puffs into the lungs every 6 (six) hours as needed for wheezing or shortness of breath. Ok to fill with preferred pro air 02/20/21  Yes Wendie Agreste, MD  bimatoprost (LUMIGAN) 0.01 % SOLN Place 1 drop into both eyes at bedtime.    Yes [provider]  cetirizine (ZYRTEC) 10 MG tablet Take 1 tablet (10 mg total) by mouth daily. 07/13/20  Yes Wendie Agreste, MD  CINNAMON PO Take 1,000 mg by mouth daily.    Yes [provider]  Flaxseed, Linseed, (FLAXSEED OIL)  OIL Take 2,400 mg by mouth.   Yes [provider]  Fluticasone-Salmeterol (ADVAIR DISKUS) 250-50 MCG/DOSE AEPB Inhale 1 puff into the lungs 2 (two) times daily. 02/20/21  Yes Wendie Agreste, MD  Garlic 0109 MG CAPS Take 1,000 mg by mouth daily.    Yes [provider]  Melatonin 1 MG TABS Take 1 mg by mouth at bedtime as needed (for sleep).    Yes [provider]  Multiple Vitamins-Minerals (CENTRUM SILVER 50+MEN PO) Take by mouth.   Yes [provider]  Omega 3-6-9 Fatty Acids (OMEGA-3-6-9 PO) Take 2 capsules by mouth daily.   Yes [provider]  OVER THE COUNTER MEDICATION Place 1 Dose under the tongue 2 (two) times a day. CBD oil   Yes [provider]  RHOPRESSA 0.02 % SOLN  11/22/19  Yes [provider]  SIMBRINZA 1-0.2 % SUSP Place 1 drop into both eyes 3 (three) times daily.  02/05/19  Yes [provider]  Turmeric Curcumin 500 MG CAPS Take 2 capsules by mouth daily.    Yes [provider]  hydrOXYzine (ATARAX/VISTARIL) 25 MG tablet Take 0.5 tablets (12.5 mg total) by mouth at bedtime as needed for anxiety (or sleep.). Patient not taking: Reported on 05/22/2021 02/20/21   Wendie Agreste, MD  tamsulosin (FLOMAX) 0.4 MG CAPS capsule Take 1 capsule (0.4 mg total) by mouth at bedtime. Patient not taking: Reported on 05/22/2021 07/13/20   Wendie Agreste, MD   Social History   Socioeconomic History  . Marital status: Single    Spouse name: Not on file  . Number of children: 1  . Years of education: Not on file  . Highest education level: Not on file  Occupational History  . Occupation: distribution    Employer: [employer]  Tobacco Use  . Smoking status: Never Smoker  . Smokeless tobacco: Never Used  Vaping Use  . Vaping Use: Never used  Substance and Sexual Activity  . Alcohol use: Yes    Alcohol/week: 0.0 standard drinks    Comment: occasional glass of wine  . Drug use: No  . Sexual  activity: Yes  Other Topics Concern  . Not on file  Social History Narrative  . Not on file   Social Determinants of Health   Financial Resource Strain: Not on file  Food Insecurity: Not on file  Transportation Needs: Not on file  Physical Activity: Not on file  Stress: Not on file  Social Connections: Not on file  Intimate Partner Violence: Not on file    Review of Systems Per HPI.   Objective:   Vitals:   05/22/21 0932  BP: 122/74  Pulse: 62  Resp: 16  Temp: 98 F (36.7 C)  TempSrc: Temporal  SpO2: 96%  Weight: 187 lb 9.6 oz (85.1 kg)  Height: 5\' 11"  (1.803 m)    Physical Exam Vitals reviewed.  Constitutional:      Appearance: He is well-developed.  HENT:     Head: Normocephalic and atraumatic.  Eyes:     Pupils: Pupils are equal, round, and reactive to light.  Neck:     Vascular: No carotid bruit or JVD.  Cardiovascular:     Rate and Rhythm: Normal rate and regular rhythm.     Heart sounds: Normal heart sounds. No murmur heard.   Pulmonary:     Effort: Pulmonary effort is normal.     Breath sounds: Normal breath sounds. No rales.  Skin:    General: Skin is warm and dry.  Neurological:     Mental Status: He is alert and oriented to person, place, and time.      Assessment & Plan:  Douglas Russell is a 70 y.o. male . Nocturia  -Persistent symptoms, appears that Myrbetriq may have also been offered at last urology eval.  Recommended he call urology to discuss additional options as likely contributor to his insomnia.  Additionally recommended follow-up with Ortho if persistent knee/leg pains are interfering with his sleep.  RTC precautions.  Avoid further doses of hydroxyzine due to previous dizziness.  Handicap placard provided temporarily for his knee pains.  Allergic rhinitis, unspecified seasonality, unspecified trigger - Plan: cetirizine (ZYRTEC) 10 MG tablet  -Stable with Zyrtec, continue same   Mild intermittent asthma without  complication   -Improved with Advair, continue same.  13-month follow-up.  Meds ordered this encounter  Medications  . cetirizine (ZYRTEC) 10 MG tablet    Sig: Take 1 tablet (10 mg total) by mouth daily.    Dispense:  90 tablet    Refill:  2   Patient Instructions  I would recommend calling your uroligist about other med that was recommended at your March visit. No new sleep meds at this time. Keep follow up with ortho regarding your knee and leg pain as tat may also impact your sleep.   Avoid further hydroxyzine, ok to continue zyrtec.   Asthma well controlled. No med changes at this time. Recheck in 3 months.      Signed, Merri Ray, MD [facility]

## 2021-05-22 NOTE — Patient Instructions (Addendum)
I would recommend calling your uroligist about other med that was recommended at your March visit. No new sleep meds at this time. Keep follow up with ortho regarding your knee and leg pain as tat may also impact your sleep.   Avoid further hydroxyzine, ok to continue zyrtec.   Asthma well controlled. No med changes at this time. Recheck in 3 months.

## 2021-06-19 DIAGNOSIS — M1712 Unilateral primary osteoarthritis, left knee: Secondary | ICD-10-CM | POA: Diagnosis not present

## 2021-06-20 ENCOUNTER — Telehealth: Payer: Self-pay

## 2021-06-20 NOTE — Telephone Encounter (Signed)
I have LM asking pt to call back to schedule an appt [facility]

## 2021-06-20 NOTE — Telephone Encounter (Signed)
Received fax for pre operative clearance, pt will need an appointment to complete this as we did not perform necessary tests at most recent visit.   Thank you

## 2021-07-29 ENCOUNTER — Telehealth: Payer: Self-pay

## 2021-07-29 NOTE — Telephone Encounter (Signed)
LVM for patient to call back and reschedule a pre-surgery appointment

## 2021-07-30 NOTE — Telephone Encounter (Signed)
Pt called to inform us surgery has been canceled until further notice

## 2021-08-01 DIAGNOSIS — H2513 Age-related nuclear cataract, bilateral: Secondary | ICD-10-CM | POA: Diagnosis not present

## 2021-08-01 DIAGNOSIS — H401132 Primary open-angle glaucoma, bilateral, moderate stage: Secondary | ICD-10-CM | POA: Diagnosis not present

## 2021-11-06 DIAGNOSIS — Z0100 Encounter for examination of eyes and vision without abnormal findings: Secondary | ICD-10-CM | POA: Diagnosis not present

## 2021-11-06 DIAGNOSIS — H401132 Primary open-angle glaucoma, bilateral, moderate stage: Secondary | ICD-10-CM | POA: Diagnosis not present

## 2021-11-06 DIAGNOSIS — H2513 Age-related nuclear cataract, bilateral: Secondary | ICD-10-CM | POA: Diagnosis not present

## 2021-12-18 ENCOUNTER — Telehealth: Payer: Self-pay | Admitting: Family Medicine

## 2021-12-18 NOTE — Telephone Encounter (Signed)
Pt called in asking if Dr. Carlota Raspberry would be willing to give him refills on Advair and Tamsulosin. Pt uses [facility] on [address]   Please advise

## 2021-12-19 ENCOUNTER — Other Ambulatory Visit: Payer: Self-pay | Admitting: Family Medicine

## 2021-12-19 ENCOUNTER — Other Ambulatory Visit: Payer: Self-pay

## 2021-12-19 DIAGNOSIS — R351 Nocturia: Secondary | ICD-10-CM

## 2021-12-19 DIAGNOSIS — J4520 Mild intermittent asthma, uncomplicated: Secondary | ICD-10-CM

## 2021-12-19 MED ORDER — TAMSULOSIN HCL 0.4 MG PO CAPS: 0.4000 mg | ORAL_CAPSULE | Freq: Every day | ORAL | 0 refills | Status: DC

## 2022-01-30 ENCOUNTER — Ambulatory Visit (INDEPENDENT_AMBULATORY_CARE_PROVIDER_SITE_OTHER): Payer: Medicare HMO | Admitting: Family Medicine

## 2022-01-30 ENCOUNTER — Encounter: Payer: Self-pay | Admitting: Family Medicine

## 2022-01-30 VITALS — BP 130/78 | HR 78 | Temp 98.3°F | Resp 17 | Ht 71.0 in | Wt 193.6 lb

## 2022-01-30 DIAGNOSIS — R509 Fever, unspecified: Secondary | ICD-10-CM

## 2022-01-30 DIAGNOSIS — J309 Allergic rhinitis, unspecified: Secondary | ICD-10-CM

## 2022-01-30 DIAGNOSIS — R351 Nocturia: Secondary | ICD-10-CM

## 2022-01-30 DIAGNOSIS — R42 Dizziness and giddiness: Secondary | ICD-10-CM

## 2022-01-30 DIAGNOSIS — R3915 Urgency of urination: Secondary | ICD-10-CM

## 2022-01-30 DIAGNOSIS — J4520 Mild intermittent asthma, uncomplicated: Secondary | ICD-10-CM

## 2022-01-30 DIAGNOSIS — R5383 Other fatigue: Secondary | ICD-10-CM

## 2022-01-30 LAB — COMPREHENSIVE METABOLIC PANEL
ALT: 27 U/L (ref 0–53)
AST: 33 U/L (ref 0–37)
Albumin: 4.2 g/dL (ref 3.5–5.2)
Alkaline Phosphatase: 69 U/L (ref 39–117)
BUN: 20 mg/dL (ref 6–23)
CO2: 31 mEq/L (ref 19–32)
Calcium: 9.2 mg/dL (ref 8.4–10.5)
Chloride: 105 mEq/L (ref 96–112)
Creatinine, Ser: 1 mg/dL (ref 0.40–1.50)
GFR: 74.91 mL/min (ref >60.00)
Glucose, Bld: 81 mg/dL (ref 70–99)
Potassium: 4.6 mEq/L (ref 3.5–5.1)
Sodium: 141 mEq/L (ref 135–145)
Total Bilirubin: 1 mg/dL (ref 0.2–1.2)
Total Protein: 7.1 g/dL (ref 6.0–8.3)

## 2022-01-30 LAB — POCT URINALYSIS DIP (MANUAL ENTRY)
Bilirubin, UA: NEGATIVE
Blood, UA: NEGATIVE
Glucose, UA: NEGATIVE mg/dL
Ketones, POC UA: NEGATIVE mg/dL
Leukocytes, UA: NEGATIVE
Nitrite, UA: NEGATIVE
Protein Ur, POC: NEGATIVE mg/dL
Spec Grav, UA: 1.015 (ref 1.010–1.025)
Urobilinogen, UA: 0.2 E.U./dL
pH, UA: 7.5 (ref 5.0–8.0)

## 2022-01-30 LAB — TSH: TSH: 0.79 u[IU]/mL (ref 0.35–5.50)

## 2022-01-30 LAB — CBC
HCT: 44 % (ref 39.0–52.0)
Hemoglobin: 14.6 g/dL (ref 13.0–17.0)
MCHC: 33.3 g/dL (ref 30.0–36.0)
MCV: 89.2 fl (ref 78.0–100.0)
Platelets: 161 10*3/uL (ref 150.0–400.0)
RBC: 4.93 Mil/uL (ref 4.22–5.81)
RDW: 15.2 % (ref 11.5–15.5)
WBC: 4.8 10*3/uL (ref 4.0–10.5)

## 2022-01-30 LAB — PSA: PSA: 0.79 ng/mL (ref 0.10–4.00)

## 2022-01-30 MED ORDER — FLUTICASONE-SALMETEROL 250-50 MCG/ACT IN AEPB: INHALATION_SPRAY | RESPIRATORY_TRACT | 0 refills | Status: DC

## 2022-01-30 MED ORDER — CETIRIZINE HCL 10 MG PO TABS: 10.0000 mg | ORAL_TABLET | Freq: Every day | ORAL | 2 refills | Status: DC

## 2022-01-30 MED ORDER — TAMSULOSIN HCL 0.4 MG PO CAPS: 0.4000 mg | ORAL_CAPSULE | Freq: Every day | ORAL | 1 refills | Status: DC

## 2022-01-30 NOTE — Progress Notes (Signed)
Subjective:  Patient ID: Douglas Russell, male    DOB: 05/14/1951  Age: 71 y.o. MRN: [id number]  CC:  Chief Complaint  Patient presents with   Allergies    Pt reports doing okay does need some refills on allergy medications    Fever    Pt reports low grade fever notes did not take temp but based off how he had felt, reports dizzy feeling in the am, general tired feeling    Nocturia    Pt is in need of refill flomax reports still urinates frequently overnight but this does help     HPI Douglas Russell presents for   Concerns above.   Fever, dizziness, fatigue.  Started few weeks ago. Felt warm feeling in morning and evening. No chills, no measured fever. Lightheaded at times past few weeks. No syncope/near-syncope. Better after eating - eating at 6 am, snack at 11 when feeling lightheaded.  Lunch on the go, dinner at 6pm.  No melena, hematochezia or bowel changes.  No chest pain/palpitations.  No cough, sore throat, or other infection symptoms.  No abd pain, no rash.  Uses flomax for nocturia - 2-3 times per night. Stable. No dysuria. Urgency has been chonic. Planned follow up with urology, Dr. Abner Greenspan next month. Decreases liquids after 8pm.  No new meds or supplements.   Mild intermittent asthma: Advair - taking once per day, with second dose only if needed- about 2 times per week.  Albuterol - not needed.  Cetirizine working well for allergies.       History Patient Active Problem List   Diagnosis Date Noted   Hx of adenomatous polyp of colon 01/25/2015   Status post left hip replacement 10/13/2014   Asthma, chronic 10/13/2014   Osteoarthritis of left hip 10/10/2014   Hip arthritis 10/10/2014   History of asthma 08/08/2014   History of umbilical hernia 32/99/2426   History of pelvic fracture 08/08/2014   Past Medical History:  Diagnosis Date   Arthritis    L&R hip & pelvis    Asthma    has had since he was a child, states he only uses the inhaler PRN   Asthma     Phreesia 10/09/2020   Cataract    Family history of adverse reaction to anesthesia    Pt. not exactly sure-sister"they almost lost her"during  a surgery   Glaucoma    Glaucoma    both eyes   History of blood transfusion    /w pelvic fx.    Hx of adenomatous polyp of colon 01/25/2015   Osteoarthritis of left hip 10/10/2014   Pelvic fracture ([place]) 09/28/1994   Pneumonia    in hosp[facility] ( /w pelvic fx)-    Past Surgical History:  Procedure Laterality Date   back cyst  2006   surgery x4 for cyst on the back    EYE SURGERY Bilateral March 2016   Dr. Marylynn Pearson, [place]   hip   HERNIA REPAIR  [id number]   umbilical    INGUINAL HERNIA REPAIR Right 05/13/2019   Procedure: RIGHT INGUINAL HERNIA REPAIR WITH MESH;  Surgeon: Coralie Keens, MD;  Location: [place];  Service: General;  Laterality: Right;  AND TAP BLOCK   JOINT REPLACEMENT N/A    Phreesia 10/09/2020   PELVIC FRACTURE SURGERY  1995   TOTAL HIP ARTHROPLASTY Left 10/10/2014   dr Mardelle Matte   TOTAL HIP ARTHROPLASTY Left 10/10/2014   Procedure: TOTAL HIP  ARTHROPLASTY;  Surgeon: Johnny Bridge, MD;  Location: [place];  Service: Orthopedics;  Laterality: Left;   Allergies  Allergen Reactions   Other Hives and Shortness Of Breath   Shellfish Allergy Anaphylaxis and Swelling    Throat swells closed," like there is a block of cement in my throat"    Trazodone And Nefazodone     Jittery / shaking    Prior to Admission medications   Medication Sig Start Date End Date Taking? Authorizing Provider  albuterol (VENTOLIN HFA) 108 (90 Base) MCG/ACT inhaler Inhale 1-2 puffs into the lungs every 6 (six) hours as needed for wheezing or shortness of breath. Ok to fill with preferred pro air 02/20/21  Yes Wendie Agreste, MD  bimatoprost (LUMIGAN) 0.01 % SOLN Place 1 drop into both eyes at bedtime.    Yes [provider]  cetirizine (ZYRTEC) 10 MG tablet Take 1 tablet (10 mg total) by mouth daily. 05/22/21  Yes  Wendie Agreste, MD  CINNAMON PO Take 1,000 mg by mouth daily.    Yes [provider]  Flaxseed, Linseed, (FLAXSEED OIL) OIL Take 2,400 mg by mouth.   Yes [provider]  fluticasone-salmeterol (ADVAIR) 250-50 MCG/ACT AEPB INHALE ONE PUFF INTO THE LUNGS TWICE DAILY 12/19/21  Yes Wendie Agreste, MD  Garlic 1610 MG CAPS Take 1,000 mg by mouth daily.    Yes [provider]  Melatonin 1 MG TABS Take 1 mg by mouth at bedtime as needed (for sleep).    Yes [provider]  Multiple Vitamins-Minerals (CENTRUM SILVER 50+MEN PO) Take by mouth.   Yes [provider]  Omega 3-6-9 Fatty Acids (OMEGA-3-6-9 PO) Take 2 capsules by mouth daily.   Yes [provider]  OVER THE COUNTER MEDICATION Place 1 Dose under the tongue 2 (two) times a day. CBD oil   Yes [provider]  RHOPRESSA 0.02 % SOLN  11/22/19  Yes [provider]  SIMBRINZA 1-0.2 % SUSP Place 1 drop into both eyes 3 (three) times daily.  02/05/19  Yes [provider]  tamsulosin (FLOMAX) 0.4 MG CAPS capsule Take 1 capsule (0.4 mg total) by mouth at bedtime. 12/19/21  Yes Wendie Agreste, MD  Turmeric Curcumin 500 MG CAPS Take 2 capsules by mouth daily.    Yes [provider]   Social History   Socioeconomic History   Marital status: Single    Spouse name: Not on file   Number of children: 1   Years of education: Not on file   Highest education level: Not on file  Occupational History   Occupation: distribution    Employer: [employer]  Tobacco Use   Smoking status: Never   Smokeless tobacco: Never  Vaping Use   Vaping Use: Never used  Substance and Sexual Activity   Alcohol use: Yes    Alcohol/week: 0.0 standard drinks    Comment: occasional glass of wine   Drug use: No   Sexual activity: Yes  Other Topics Concern   Not on file  Social History Narrative   Not on file   Social Determinants of Health   Financial Resource  Strain: Not on file  Food Insecurity: Not on file  Transportation Needs: Not on file  Physical Activity: Not on file  Stress: Not on file  Social Connections: Not on file  Intimate Partner Violence: Not on file    Review of Systems Per HPI.   Objective:   Vitals:  01/30/22 1047  BP: 130/78  Pulse: 78  Resp: 17  Temp: 98.3 F (36.8 C)  TempSrc: Temporal  SpO2: 98%  Weight: 193 lb 9.6 oz (87.8 kg)  Height: 5' 11" (1.803 m)     Physical Exam Vitals reviewed.  Constitutional:      General: He is not in acute distress.    Appearance: Normal appearance. He is well-developed. He is not toxic-appearing.  HENT:     Head: Normocephalic and atraumatic.  Neck:     Vascular: No carotid bruit or JVD.  Cardiovascular:     Rate and Rhythm: Normal rate and regular rhythm.     Heart sounds: Normal heart sounds. No murmur heard.   No friction rub.  Pulmonary:     Effort: Pulmonary effort is normal.     Breath sounds: Normal breath sounds. No rales.  Abdominal:     General: Abdomen is flat. There is no distension.     Palpations: Abdomen is soft.     Tenderness: There is no abdominal tenderness.  Musculoskeletal:     Right lower leg: No edema.     Left lower leg: No edema.  Skin:    General: Skin is warm and dry.  Neurological:     General: No focal deficit present.     Mental Status: He is alert and oriented to person, place, and time.  Psychiatric:        Mood and Affect: Mood normal.    EKG: Sinus rhythm, rate 62.  First-degree AV block with PR interval of 220 compared to 118 08/08/2014.  Left axis.  No apparent acute ST/T wave changes or significant changes from EKG 08/08/2014.   Results for orders placed or performed in visit on 01/30/22  POCT urinalysis dipstick  Result Value Ref Range   Color, UA straw (A) yellow   Clarity, UA clear clear   Glucose, UA negative negative mg/dL   Bilirubin, UA negative negative   Ketones, POC UA negative negative mg/dL   Spec  Grav, UA 1.015 1.010 - 1.025   Blood, UA negative negative   pH, UA 7.5 5.0 - 8.0   Protein Ur, POC negative negative mg/dL   Urobilinogen, UA 0.2 0.2 or 1.0 E.U./dL   Nitrite, UA Negative Negative   Leukocytes, UA Negative Negative    Assessment & Plan:  Douglas Russell is a 71 y.o. male . Other fatigue - Plan: CBC, Comprehensive metabolic panel, TSH, EKG 01-XBLT Episode of dizziness - Plan: CBC, Comprehensive metabolic panel, TSH, EKG 90-ZESP Subjective fever - Plan: PSA, POCT urinalysis dipstick Nocturia - Plan: tamsulosin (FLOMAX) 0.4 MG CAPS capsule, PSA, POCT urinalysis dipstick Urinary urgency - Plan: PSA, POCT urinalysis dipstick  -Chronic nocturia, urgency, in office urinalysis reassuring.  Check PSA.  -Recent fatigue, possibly multifactorial.  Check CBC to evaluate for infection given subjective fever, but afebrile in office.  No focal signs of infection appreciated on exam/history.  -Maintenance of hydration discussed, regular meals with snacks in between if needed.  Check TSH, CMP  -Borderline first-degree block on EKG but no acute findings and unlikely explanation for his symptoms.   Recheck 1 week  Mild intermittent asthma without complication - Plan: fluticasone-salmeterol (ADVAIR) 250-50 MCG/ACT AEPB Dosing discussed for improved control.  RTC precautions.  Encouraged that he has not required albuterol recently.  Allergic rhinitis, unspecified seasonality, unspecified trigger - Plan: cetirizine (ZYRTEC) 10 MG tablet Stable, continue cetirizine.  Less likely cause of fatigue but may need temporary cessation if  no other cause found above.   Meds ordered this encounter  Medications   tamsulosin (FLOMAX) 0.4 MG CAPS capsule    Sig: Take 1 capsule (0.4 mg total) by mouth at bedtime.    Dispense:  90 capsule    Refill:  1   fluticasone-salmeterol (ADVAIR) 250-50 MCG/ACT AEPB    Sig: INHALE ONE PUFF INTO THE LUNGS TWICE DAILY    Dispense:  60 each    Refill:  0    cetirizine (ZYRTEC) 10 MG tablet    Sig: Take 1 tablet (10 mg total) by mouth daily.    Dispense:  90 tablet    Refill:  2   Patient Instructions  Drink at least 48 ounces of water per day, regular meals and snacks in between.  I will check labs for the fatigue. Return to the clinic or go to the nearest emergency room if any of your symptoms worsen or new symptoms occur, but we will recheck next week.  No med changes for now.  Advair can be taken twice per day for prevention of asthma flares.  EKG overall looks similar to previous EKG.  Less likely cause of dizziness and but follow-up as planned, sooner if any new or worsening symptoms.   Fatigue If you have fatigue, you feel tired all the time and have a lack of energy or a lack of motivation. Fatigue may make it difficult to start or complete tasks because of exhaustion. In general, occasional or mild fatigue is often a normal response to activity or life. However, long-lasting (chronic) or extreme fatigue may be a symptom of a medical condition. Follow these instructions at home: General instructions Watch your fatigue for any changes. Go to bed and get up at the same time every day. Avoid fatigue by pacing yourself during the day and getting enough sleep at night. Maintain a healthy weight. Medicines Take over-the-counter and prescription medicines only as told by your health care provider. Take a multivitamin, if told by your health care provider.  Do not use herbal or dietary supplements unless they are approved by your health care provider. Activity  Exercise regularly, as told by your health care provider. Use or practice techniques to help you relax, such as yoga, tai chi, meditation, or massage therapy. Eating and drinking  Avoid heavy meals in the evening. Eat a well-balanced diet, which includes lean proteins, whole grains, plenty of fruits and vegetables, and low-fat dairy products. Avoid consuming too much caffeine. Avoid  the use of alcohol. Drink enough fluid to keep your urine pale yellow. Lifestyle Change situations that cause you stress. Try to keep your work and personal schedule in balance. Do not use any products that contain nicotine or tobacco, such as cigarettes and e-cigarettes. If you need help quitting, ask your health care provider. Do not use drugs. Contact a health care provider if: Your fatigue does not get better. You have a fever. You suddenly lose or gain weight. You have headaches. You have trouble falling asleep or sleeping through the night. You feel angry, guilty, anxious, or sad. You are unable to have a bowel movement (constipation). Your skin is dry. You have swelling in your legs or another part of your body. Get help right away if: You feel confused. Your vision is blurry. You feel faint or you pass out. You have a severe headache. You have severe pain in your abdomen, your back, or the area between your waist and hips (pelvis). You have chest pain,  shortness of breath, or an irregular or fast heartbeat. You are unable to urinate, or you urinate less than normal. You have abnormal bleeding, such as bleeding from the rectum, vagina, nose, lungs, or nipples. You vomit blood. You have thoughts about hurting yourself or others. If you ever feel like you may hurt yourself or others, or have thoughts about taking your own life, get help right away. You can go to your nearest emergency department or call: Your local emergency services (911 in the [place] A suicide crisis helpline, such as the [facility] at 571-550-7376 or 988 in the [place]. This is open 24 hours a day. Summary If you have fatigue, you feel tired all the time and have a lack of energy or a lack of motivation. Fatigue may make it difficult to start or complete tasks because of exhaustion. Long-lasting (chronic) or extreme fatigue may be a symptom of a medical condition. Exercise regularly,  as told by your health care provider. Change situations that cause you stress. Try to keep your work and personal schedule in balance. This information is not intended to replace advice given to you by your health care provider. Make sure you discuss any questions you have with your health care provider. Document Revised: 07/10/2021 Document Reviewed: 10/25/2020 Elsevier Patient Education  2022 [place],   Merri Ray, MD [place], [facility] 01/30/22 11:58 AM

## 2022-01-30 NOTE — Patient Instructions (Addendum)
Drink at least 48 ounces of water per day, regular meals and snacks in between.  I will check labs for the fatigue. Return to the clinic or go to the nearest emergency room if any of your symptoms worsen or new symptoms occur, but we will recheck next week.  No med changes for now.  Advair can be taken twice per day for prevention of asthma flares.  EKG overall looks similar to previous EKG.  Less likely cause of dizziness and but follow-up as planned, sooner if any new or worsening symptoms.   Fatigue If you have fatigue, you feel tired all the time and have a lack of energy or a lack of motivation. Fatigue may make it difficult to start or complete tasks because of exhaustion. In general, occasional or mild fatigue is often a normal response to activity or life. However, long-lasting (chronic) or extreme fatigue may be a symptom of a medical condition. Follow these instructions at home: General instructions Watch your fatigue for any changes. Go to bed and get up at the same time every day. Avoid fatigue by pacing yourself during the day and getting enough sleep at night. Maintain a healthy weight. Medicines Take over-the-counter and prescription medicines only as told by your health care provider. Take a multivitamin, if told by your health care provider.  Do not use herbal or dietary supplements unless they are approved by your health care provider. Activity  Exercise regularly, as told by your health care provider. Use or practice techniques to help you relax, such as yoga, tai chi, meditation, or massage therapy. Eating and drinking  Avoid heavy meals in the evening. Eat a well-balanced diet, which includes lean proteins, whole grains, plenty of fruits and vegetables, and low-fat dairy products. Avoid consuming too much caffeine. Avoid the use of alcohol. Drink enough fluid to keep your urine pale yellow. Lifestyle Change situations that cause you stress. Try to keep your work and  personal schedule in balance. Do not use any products that contain nicotine or tobacco, such as cigarettes and e-cigarettes. If you need help quitting, ask your health care provider. Do not use drugs. Contact a health care provider if: Your fatigue does not get better. You have a fever. You suddenly lose or gain weight. You have headaches. You have trouble falling asleep or sleeping through the night. You feel angry, guilty, anxious, or sad. You are unable to have a bowel movement (constipation). Your skin is dry. You have swelling in your legs or another part of your body. Get help right away if: You feel confused. Your vision is blurry. You feel faint or you pass out. You have a severe headache. You have severe pain in your abdomen, your back, or the area between your waist and hips (pelvis). You have chest pain, shortness of breath, or an irregular or fast heartbeat. You are unable to urinate, or you urinate less than normal. You have abnormal bleeding, such as bleeding from the rectum, vagina, nose, lungs, or nipples. You vomit blood. You have thoughts about hurting yourself or others. If you ever feel like you may hurt yourself or others, or have thoughts about taking your own life, get help right away. You can go to your nearest emergency department or call: Your local emergency services (911 in the [place] A suicide crisis helpline, such as the [facility] at 959-324-4271 or 988 in the [place]. This is open 24 hours a day. Summary If you have fatigue, you feel  tired all the time and have a lack of energy or a lack of motivation. Fatigue may make it difficult to start or complete tasks because of exhaustion. Long-lasting (chronic) or extreme fatigue may be a symptom of a medical condition. Exercise regularly, as told by your health care provider. Change situations that cause you stress. Try to keep your work and personal schedule in balance. This  information is not intended to replace advice given to you by your health care provider. Make sure you discuss any questions you have with your health care provider. Document Revised: 07/10/2021 Document Reviewed: 10/25/2020 Elsevier Patient Education  2022 [employer].

## 2022-02-12 ENCOUNTER — Encounter: Payer: Self-pay | Admitting: Family Medicine

## 2022-02-12 ENCOUNTER — Ambulatory Visit (INDEPENDENT_AMBULATORY_CARE_PROVIDER_SITE_OTHER): Payer: Medicare HMO | Admitting: Family Medicine

## 2022-02-12 VITALS — BP 126/70 | HR 80 | Temp 98.0°F | Resp 16 | Ht 71.0 in | Wt 187.8 lb

## 2022-02-12 DIAGNOSIS — R5383 Other fatigue: Secondary | ICD-10-CM

## 2022-02-12 DIAGNOSIS — R42 Dizziness and giddiness: Secondary | ICD-10-CM

## 2022-02-12 MED ORDER — FLUTICASONE PROPIONATE 50 MCG/ACT NA SUSP: 1.0000 | Freq: Every day | NASAL | 6 refills | Status: DC

## 2022-02-12 NOTE — Progress Notes (Signed)
Subjective:  Patient ID: Douglas Russell, male    DOB: 1951-03-27  Age: 71 y.o. MRN: [id number]  CC:  Chief Complaint  Patient presents with   Dizziness    Pt here for follow up notes no real changes, did see eye dr nothing noted from them as being wrong could be from glaucoma     HPI Douglas Russell presents for   Follow-up from February 2.  At that time has fatigue, subjective fever, but afebrile in office.  Chronic nocturia, urgency.  Borderline first-degree block on EKG but no acute findings.  Urinalysis obtained, CBC, TSH, CMP all reassuring.  Increased hydration discussed.  We also discussed possible discontinuation of antihistamine Zyrtec. If stops zyrtec, has had rebound congestion.  Since last visit, minimal changes. Low grade fever in the am around 9-10 am. Warm sensation. No syncope/presyncope or room spinning.  Subjective - has not measured fever. Feels warm. General malaise, improves after bowel movement. Notes prior to feeling of having to have BM. Better after having BM. Infrequent sx's - not daily. Every few days.   Discussed with optho - bright spots, flashes of light in both eyes morning and evening. ? Possible side effects from eye gtts. Noticed after BM at times.  No hard stools, no straining.  No chest pains, palpitations or focal weakness. No headache. Generally feels fine except for brief episodes in am.   Nocturia 2-3 per night. Rare glass of wine. Continue tamsulosin for BPH, nocturia, urology eval last year - due for follow up.   Results for orders placed or performed in visit on 01/30/22  CBC  Result Value Ref Range   WBC 4.8 4.0 - 10.5 K/uL   RBC 4.93 4.22 - 5.81 Mil/uL   Platelets 161.0 150.0 - 400.0 K/uL   Hemoglobin 14.6 13.0 - 17.0 g/dL   HCT 44.0 39.0 - 52.0 %   MCV 89.2 78.0 - 100.0 fl   MCHC 33.3 30.0 - 36.0 g/dL   RDW 15.2 11.5 - 15.5 %  Comprehensive metabolic panel  Result Value Ref Range   Sodium 141 135 - 145 mEq/L   Potassium 4.6 3.5 -  5.1 mEq/L   Chloride 105 96 - 112 mEq/L   CO2 31 19 - 32 mEq/L   Glucose, Bld 81 70 - 99 mg/dL   BUN 20 6 - 23 mg/dL   Creatinine, Ser 1.00 0.40 - 1.50 mg/dL   Total Bilirubin 1.0 0.2 - 1.2 mg/dL   Alkaline Phosphatase 69 39 - 117 U/L   AST 33 0 - 37 U/L   ALT 27 0 - 53 U/L   Total Protein 7.1 6.0 - 8.3 g/dL   Albumin 4.2 3.5 - 5.2 g/dL   GFR 74.91 >60.00 mL/min   Calcium 9.2 8.4 - 10.5 mg/dL  TSH  Result Value Ref Range   TSH 0.79 0.35 - 5.50 uIU/mL  PSA  Result Value Ref Range   PSA 0.79 0.10 - 4.00 ng/mL  POCT urinalysis dipstick  Result Value Ref Range   Color, UA straw (A) yellow   Clarity, UA clear clear   Glucose, UA negative negative mg/dL   Bilirubin, UA negative negative   Ketones, POC UA negative negative mg/dL   Spec Grav, UA 1.015 1.010 - 1.025   Blood, UA negative negative   pH, UA 7.5 5.0 - 8.0   Protein Ur, POC negative negative mg/dL   Urobilinogen, UA 0.2 0.2 or 1.0 E.U./dL   Nitrite, UA Negative Negative  Leukocytes, UA Negative Negative     History Patient Active Problem List   Diagnosis Date Noted   Hx of adenomatous polyp of colon 01/25/2015   Status post left hip replacement 10/13/2014   Asthma, chronic 10/13/2014   Osteoarthritis of left hip 10/10/2014   Hip arthritis 10/10/2014   History of asthma 08/08/2014   History of umbilical hernia 78/29/5621   History of pelvic fracture 08/08/2014   Past Medical History:  Diagnosis Date   Arthritis    L&R hip & pelvis    Asthma    has had since he was a child, states he only uses the inhaler PRN   Asthma    [employer]reesia 10/09/2020   Cataract    Family history of adverse reaction to anesthesia    Pt. not exactly sure-sister"they almost lost her"during  a surgery   Glaucoma    Glaucoma    both eyes   History of blood transfusion    /w pelvic fx.    Hx of adenomatous polyp of colon 01/25/2015   Osteoarthritis of left hip 10/10/2014   Pelvic fracture ([place]) 09/28/1994   Pneumonia    in  hosp[facility] ( /w pelvic fx)-    Past Surgical History:  Procedure Laterality Date   back cyst  2006   surgery x4 for cyst on the back    EYE SURGERY Bilateral March 2016   Dr. Marylynn Pearson, [place]   hip   HERNIA REPAIR  3086   umbilical    INGUINAL HERNIA REPAIR Right 05/13/2019   Procedure: RIGHT INGUINAL HERNIA REPAIR WITH MESH;  Surgeon: Coralie Keens, MD;  Location: [facility];  Service: General;  Laterality: Right;  AND TAP BLOCK   JOINT REPLACEMENT N/A    [employer]reesia 10/09/2020   PELVIC FRACTURE SURGERY  1995   TOTAL HIP ARTHROPLASTY Left 10/10/2014   dr Mardelle Matte   TOTAL HIP ARTHROPLASTY Left 10/10/2014   Procedure: TOTAL HIP ARTHROPLASTY;  Surgeon: Johnny Bridge, MD;  Location: [place];  Service: Orthopedics;  Laterality: Left;   Allergies  Allergen Reactions   Other Hives and Shortness Of Breath   Shellfish Allergy Anaphylaxis and Swelling    Throat swells closed," like there is a block of cement in my throat"    Trazodone And Nefazodone     Jittery / shaking    Prior to Admission medications   Medication Sig Start Date End Date Taking? Authorizing Provider  albuterol (VENTOLIN HFA) 108 (90 Base) MCG/ACT inhaler Inhale 1-2 puffs into the lungs every 6 (six) hours as needed for wheezing or shortness of breath. Ok to fill with preferred pro air 02/20/21   Wendie Agreste, MD  bimatoprost (LUMIGAN) 0.01 % SOLN Place 1 drop into both eyes at bedtime.     [provider]  cetirizine (ZYRTEC) 10 MG tablet Take 1 tablet (10 mg total) by mouth daily. 01/30/22   Wendie Agreste, MD  CINNAMON PO Take 1,000 mg by mouth daily.     [provider]  Flaxseed, Linseed, (FLAXSEED OIL) OIL Take 2,400 mg by mouth.    [provider]  fluticasone-salmeterol (ADVAIR) 250-50 MCG/ACT AEPB INHALE ONE PUFF INTO THE LUNGS TWICE DAILY 01/30/22   Wendie Agreste, MD  Garlic 5784 MG CAPS Take 1,000 mg by mouth daily.     [provider]   Melatonin 1 MG TABS Take 1 mg by mouth at bedtime as needed (for sleep).  [provider]  Multiple Vitamins-Minerals (CENTRUM SILVER 50+MEN PO) Take by mouth.    [provider]  Omega 3-6-9 Fatty Acids (OMEGA-3-6-9 PO) Take 2 capsules by mouth daily.    [provider]  OVER THE COUNTER MEDICATION Place 1 Dose under the tongue 2 (two) times a day. CBD oil    [provider]  RHOPRESSA 0.02 % SOLN  11/22/19   [provider]  SIMBRINZA 1-0.2 % SUSP Place 1 drop into both eyes 3 (three) times daily.  02/05/19   [provider]  tamsulosin (FLOMAX) 0.4 MG CAPS capsule Take 1 capsule (0.4 mg total) by mouth at bedtime. 01/30/22   Wendie Agreste, MD  Turmeric Curcumin 500 MG CAPS Take 2 capsules by mouth daily.     [provider]   Social History   Socioeconomic History   Marital status: Single    Spouse name: Not on file   Number of children: 1   Years of education: Not on file   Highest education level: Not on file  Occupational History   Occupation: distribution    Employer: [employer]  Tobacco Use   Smoking status: Never   Smokeless tobacco: Never  Vaping Use   Vaping Use: Never used  Substance and Sexual Activity   Alcohol use: Yes    Alcohol/week: 0.0 standard drinks    Comment: occasional glass of wine   Drug use: No   Sexual activity: Yes  Other Topics Concern   Not on file  Social History Narrative   Not on file   Social Determinants of Health   Financial Resource Strain: Not on file  Food Insecurity: Not on file  Transportation Needs: Not on file  Physical Activity: Not on file  Stress: Not on file  Social Connections: Not on file  Intimate Partner Violence: Not on file    Review of Systems Per HPI  Objective:   Vitals:   02/12/22 1310  BP: 126/70  Pulse: 80  Resp: 16  Temp: 98 F (36.7 C)  TempSrc: Temporal  SpO2: 96%  Weight: 187 lb 12.8 oz (85.2 kg)  Height: 5\' 11"   (1.803 m)     Physical Exam Vitals reviewed.  Constitutional:      Appearance: He is well-developed.  HENT:     Head: Normocephalic and atraumatic.  Neck:     Vascular: No carotid bruit or JVD.  Cardiovascular:     Rate and Rhythm: Normal rate and regular rhythm.     Heart sounds: Normal heart sounds. No murmur heard. Pulmonary:     Effort: Pulmonary effort is normal.     Breath sounds: Normal breath sounds. No rales.  Musculoskeletal:     Right lower leg: No edema.     Left lower leg: No edema.  Skin:    General: Skin is warm and dry.  Neurological:     Mental Status: He is alert and oriented to person, place, and time.  Psychiatric:        Mood and Affect: Mood normal.     Assessment & Plan:  Douglas Russell is a 71 y.o. male . Other fatigue  Episode of dizziness  Brief episodes as above. improved after bowel movement.  Question of vagal component as noted prior to BM.  Less likely antihistamine but will try temporary cessation of Zyrtec.  Start Flonase in its place.  If continued symptoms may need cardiology versus neurology eval.  Update on symptoms next week  or 2.  Additionally advised he discuss his nighttime urinary symptoms and impact on sleep with urology.  Meds ordered this encounter  Medications   fluticasone (FLONASE) 50 MCG/ACT nasal spray    Sig: Place 1-2 sprays into both nostrils daily.    Dispense:  16 g    Refill:  6   Patient Instructions  With brief symptoms and improvement after bowel movement, may be a vagal reaction.  Call urology to discuss nighttime urinary symptoms and impact on sleep.  Continue to drink plenty of fluids.  Stop zyrtec for now, start flonase to see if the zyrtec may be an issue.  Give me an update in next week - if not improving, can refer to cardiology or neurology if needed.  Return to the clinic or go to the nearest emergency room if any of your symptoms worsen or new symptoms occur.     Signed,   Merri Ray,  MD [place] 02/12/22 10:04 PM

## 2022-02-12 NOTE — Patient Instructions (Addendum)
With brief symptoms and improvement after bowel movement, may be a vagal reaction.  Call urology to discuss nighttime urinary symptoms and impact on sleep.  Continue to drink plenty of fluids.  Stop zyrtec for now, start flonase to see if the zyrtec may be an issue.  Give me an update in next week - if not improving, can refer to cardiology or neurology if needed.  Return to the clinic or go to the nearest emergency room if any of your symptoms worsen or new symptoms occur.

## 2022-02-18 ENCOUNTER — Telehealth: Payer: Self-pay | Admitting: Family Medicine

## 2022-02-18 NOTE — Telephone Encounter (Signed)
Left message for patient to call back and schedule Medicare Annual Wellness Visit (AWV) in office.   If not able to come in office, please offer to do virtually or by telephone.  Left office number and my jabber [phone].  Last AWV:10/11/2020  Please schedule at anytime with Nurse Health Advisor.

## 2022-02-19 ENCOUNTER — Emergency Department (HOSPITAL_COMMUNITY)
Admission: EM | Admit: 2022-02-19 | Discharge: 2022-02-19 | Disposition: A | Discharge status: Home / Self Care | Payer: Medicare HMO | Attending: Emergency Medicine | Admitting: Emergency Medicine

## 2022-02-19 ENCOUNTER — Emergency Department (HOSPITAL_COMMUNITY): Payer: Medicare HMO

## 2022-02-19 ENCOUNTER — Encounter (HOSPITAL_COMMUNITY): Payer: Self-pay | Admitting: Emergency Medicine

## 2022-02-19 ENCOUNTER — Other Ambulatory Visit: Payer: Self-pay

## 2022-02-19 DIAGNOSIS — M4802 Spinal stenosis, cervical region: Secondary | ICD-10-CM | POA: Diagnosis not present

## 2022-02-19 DIAGNOSIS — J439 Emphysema, unspecified: Secondary | ICD-10-CM | POA: Diagnosis not present

## 2022-02-19 DIAGNOSIS — Y92410 Unspecified street and highway as the place of occurrence of the external cause: Secondary | ICD-10-CM | POA: Insufficient documentation

## 2022-02-19 DIAGNOSIS — S199XXA Unspecified injury of neck, initial encounter: Secondary | ICD-10-CM | POA: Diagnosis not present

## 2022-02-19 DIAGNOSIS — M542 Cervicalgia: Secondary | ICD-10-CM | POA: Diagnosis not present

## 2022-02-19 DIAGNOSIS — E041 Nontoxic single thyroid nodule: Secondary | ICD-10-CM | POA: Diagnosis not present

## 2022-02-19 DIAGNOSIS — S0990XA Unspecified injury of head, initial encounter: Secondary | ICD-10-CM | POA: Diagnosis not present
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
## 2022-02-19 NOTE — Discharge Instructions (Signed)
Return for any problem.  ?

## 2022-03-17 ENCOUNTER — Encounter: Payer: Self-pay | Admitting: Internal Medicine

## 2022-03-19 DIAGNOSIS — Z125 Encounter for screening for malignant neoplasm of prostate: Secondary | ICD-10-CM | POA: Diagnosis not present

## 2022-03-20 DIAGNOSIS — H2513 Age-related nuclear cataract, bilateral: Secondary | ICD-10-CM | POA: Diagnosis not present

## 2022-03-20 DIAGNOSIS — H401132 Primary open-angle glaucoma, bilateral, moderate stage: Secondary | ICD-10-CM | POA: Diagnosis not present

## 2022-03-26 DIAGNOSIS — R351 Nocturia: Secondary | ICD-10-CM | POA: Diagnosis not present

## 2022-03-26 DIAGNOSIS — N401 Enlarged prostate with lower urinary tract symptoms: Secondary | ICD-10-CM | POA: Diagnosis not present

## 2022-03-30 ENCOUNTER — Encounter: Payer: Self-pay | Admitting: Family Medicine

## 2022-04-05 ENCOUNTER — Other Ambulatory Visit: Payer: Self-pay | Admitting: Family Medicine

## 2022-04-05 DIAGNOSIS — J4520 Mild intermittent asthma, uncomplicated: Secondary | ICD-10-CM

## 2022-04-07 MED ORDER — FLUTICASONE-SALMETEROL 250-50 MCG/ACT IN AEPB: INHALATION_SPRAY | RESPIRATORY_TRACT | 0 refills | Status: DC

## 2022-05-28 ENCOUNTER — Other Ambulatory Visit: Payer: Self-pay | Admitting: Family Medicine

## 2022-05-28 DIAGNOSIS — J4520 Mild intermittent asthma, uncomplicated: Secondary | ICD-10-CM

## 2022-05-29 ENCOUNTER — Encounter: Payer: Self-pay | Admitting: Internal Medicine

## 2022-05-29 MED ORDER — FLUTICASONE-SALMETEROL 250-50 MCG/ACT IN AEPB: INHALATION_SPRAY | RESPIRATORY_TRACT | 0 refills | Status: DC

## 2022-06-20 ENCOUNTER — Other Ambulatory Visit: Payer: Self-pay | Admitting: Family Medicine

## 2022-06-20 DIAGNOSIS — J4520 Mild intermittent asthma, uncomplicated: Secondary | ICD-10-CM

## 2022-06-25 ENCOUNTER — Ambulatory Visit (AMBULATORY_SURGERY_CENTER): Payer: Self-pay | Admitting: *Deleted

## 2022-06-25 VITALS — Ht 71.0 in | Wt 188.0 lb

## 2022-06-25 DIAGNOSIS — Z86010 Personal history of colonic polyps: Secondary | ICD-10-CM
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
## 2022-07-10 DIAGNOSIS — H2513 Age-related nuclear cataract, bilateral: Secondary | ICD-10-CM | POA: Diagnosis not present

## 2022-07-10 DIAGNOSIS — H401132 Primary open-angle glaucoma, bilateral, moderate stage: Secondary | ICD-10-CM | POA: Diagnosis not present

## 2022-07-22 ENCOUNTER — Other Ambulatory Visit: Payer: Self-pay | Admitting: Family Medicine

## 2022-07-22 DIAGNOSIS — J4520 Mild intermittent asthma, uncomplicated: Secondary | ICD-10-CM

## 2022-07-25 ENCOUNTER — Telehealth: Payer: Self-pay | Admitting: Family Medicine

## 2022-07-25 NOTE — Telephone Encounter (Signed)
Left message for patient to call back and schedule Medicare Annual Wellness Visit (AWV).   Please offer to do virtually or by telephone.  Left office number and my jabber [phone].  Last AWV:10/11/2020  Please schedule at anytime with Nurse Health Advisor.

## 2022-07-28 ENCOUNTER — Encounter: Payer: Medicare HMO | Admitting: Internal Medicine

## 2022-07-30 NOTE — Telephone Encounter (Signed)
Pt is scheduled, please make sure it is scheduled correctly

## 2022-08-14 ENCOUNTER — Encounter: Payer: Medicare HMO | Admitting: Internal Medicine

## 2022-08-14 ENCOUNTER — Ambulatory Visit (INDEPENDENT_AMBULATORY_CARE_PROVIDER_SITE_OTHER): Payer: Medicare HMO

## 2022-08-14 DIAGNOSIS — Z0000 Encounter for general adult medical examination without abnormal findings: Secondary | ICD-10-CM

## 2022-08-14 NOTE — Patient Instructions (Signed)
Douglas Russell , Thank you for taking time to come for your Medicare Wellness Visit. I appreciate your ongoing commitment to your health goals. Please review the following plan we discussed and let me know if I can assist you in the future.   Screening recommendations/referrals: Colonoscopy: 09/15/2022 Recommended yearly ophthalmology/optometry visit for glaucoma screening and checkup Recommended yearly dental visit for hygiene and checkup  Vaccinations: Influenza vaccine: completed  Pneumococcal vaccine: completed  Tdap vaccine: 09/23/2013 Shingles vaccine: completed     Advanced directives: none   Conditions/risks identified: none   Next appointment: none   Preventive Care 73 Years and Older, Male Preventive care refers to lifestyle choices and visits with your health care provider that can promote health and wellness. What does preventive care include? A yearly physical exam. This is also called an annual well check. Dental exams once or twice a year. Routine eye exams. Ask your health care provider how often you should have your eyes checked. Personal lifestyle choices, including: Daily care of your teeth and gums. Regular physical activity. Eating a healthy diet. Avoiding tobacco and drug use. Limiting alcohol use. Practicing safe sex. Taking low doses of aspirin every day. Taking vitamin and mineral supplements as recommended by your health care provider. What happens during an annual well check? The services and screenings done by your health care provider during your annual well check will depend on your age, overall health, lifestyle risk factors, and family history of disease. Counseling  Your health care provider may ask you questions about your: Alcohol use. Tobacco use. Drug use. Emotional well-being. Home and relationship well-being. Sexual activity. Eating habits. History of falls. Memory and ability to understand (cognition). Work and work  Statistician. Screening  You may have the following tests or measurements: Height, weight, and BMI. Blood pressure. Lipid and cholesterol levels. These may be checked every 5 years, or more frequently if you are over 73 years old. Skin check. Lung cancer screening. You may have this screening every year starting at age 73 if you have a 30-pack-year history of smoking and currently smoke or have quit within the past 15 years. Fecal occult blood test (FOBT) of the stool. You may have this test every year starting at age 73. Flexible sigmoidoscopy or colonoscopy. You may have a sigmoidoscopy every 5 years or a colonoscopy every 10 years starting at age 73. Prostate cancer screening. Recommendations will vary depending on your family history and other risks. Hepatitis C blood test. Hepatitis B blood test. Sexually transmitted disease (STD) testing. Diabetes screening. This is done by checking your blood sugar (glucose) after you have not eaten for a while (fasting). You may have this done every 1-3 years. Abdominal aortic aneurysm (AAA) screening. You may need this if you are a current or former smoker. Osteoporosis. You may be screened starting at age 73 if you are at high risk. Talk with your health care provider about your test results, treatment options, and if necessary, the need for more tests. Vaccines  Your health care provider may recommend certain vaccines, such as: Influenza vaccine. This is recommended every year. Tetanus, diphtheria, and acellular pertussis (Tdap, Td) vaccine. You may need a Td booster every 10 years. Zoster vaccine. You may need this after age 73. Pneumococcal 13-valent conjugate (PCV13) vaccine. One dose is recommended after age 73. Pneumococcal polysaccharide (PPSV23) vaccine. One dose is recommended after age 73. Talk to your health care provider about which screenings and vaccines you need and how often you need  them. This information is not intended to replace  advice given to you by your health care provider. Make sure you discuss any questions you have with your health care provider. Document Released: 01/11/2016 Document Revised: 09/03/2016 Document Reviewed: 10/16/2015 Elsevier Interactive Patient Education  2017 [place] Prevention in the Home Falls can cause injuries. They can happen to people of all ages. There are many things you can do to make your home safe and to help prevent falls. What can I do on the outside of my home? Regularly fix the edges of walkways and driveways and fix any cracks. Remove anything that might make you trip as you walk through a door, such as a raised step or threshold. Trim any bushes or trees on the path to your home. Use bright outdoor lighting. Clear any walking paths of anything that might make someone trip, such as rocks or tools. Regularly check to see if handrails are loose or broken. Make sure that both sides of any steps have handrails. Any raised decks and porches should have guardrails on the edges. Have any leaves, snow, or ice cleared regularly. Use sand or salt on walking paths during winter. Clean up any spills in your garage right away. This includes oil or grease spills. What can I do in the bathroom? Use night lights. Install grab bars by the toilet and in the tub and shower. Do not use towel bars as grab bars. Use non-skid mats or decals in the tub or shower. If you need to sit down in the shower, use a plastic, non-slip stool. Keep the floor dry. Clean up any water that spills on the floor as soon as it happens. Remove soap buildup in the tub or shower regularly. Attach bath mats securely with double-sided non-slip rug tape. Do not have throw rugs and other things on the floor that can make you trip. What can I do in the bedroom? Use night lights. Make sure that you have a light by your bed that is easy to reach. Do not use any sheets or blankets that are too big for your bed.  They should not hang down onto the floor. Have a firm chair that has side arms. You can use this for support while you get dressed. Do not have throw rugs and other things on the floor that can make you trip. What can I do in the kitchen? Clean up any spills right away. Avoid walking on wet floors. Keep items that you use a lot in easy-to-reach places. If you need to reach something above you, use a strong step stool that has a grab bar. Keep electrical cords out of the way. Do not use floor polish or wax that makes floors slippery. If you must use wax, use non-skid floor wax. Do not have throw rugs and other things on the floor that can make you trip. What can I do with my stairs? Do not leave any items on the stairs. Make sure that there are handrails on both sides of the stairs and use them. Fix handrails that are broken or loose. Make sure that handrails are as long as the stairways. Check any carpeting to make sure that it is firmly attached to the stairs. Fix any carpet that is loose or worn. Avoid having throw rugs at the top or bottom of the stairs. If you do have throw rugs, attach them to the floor with carpet tape. Make sure that you have a light switch at  the top of the stairs and the bottom of the stairs. If you do not have them, ask someone to add them for you. What else can I do to help prevent falls? Wear shoes that: Do not have high heels. Have rubber bottoms. Are comfortable and fit you well. Are closed at the toe. Do not wear sandals. If you use a stepladder: Make sure that it is fully opened. Do not climb a closed stepladder. Make sure that both sides of the stepladder are locked into place. Ask someone to hold it for you, if possible. Clearly mark and make sure that you can see: Any grab bars or handrails. First and last steps. Where the edge of each step is. Use tools that help you move around (mobility aids) if they are needed. These  include: Canes. Walkers. Scooters. Crutches. Turn on the lights when you go into a dark area. Replace any light bulbs as soon as they burn out. Set up your furniture so you have a clear path. Avoid moving your furniture around. If any of your floors are uneven, fix them. If there are any pets around you, be aware of where they are. Review your medicines with your doctor. Some medicines can make you feel dizzy. This can increase your chance of falling. Ask your doctor what other things that you can do to help prevent falls. This information is not intended to replace advice given to you by your health care provider. Make sure you discuss any questions you have with your health care provider. Document Released: 10/11/2009 Document Revised: 05/22/2016 Document Reviewed: 01/19/2015 Elsevier Interactive Patient Education  2017 [employer].

## 2022-08-14 NOTE — Progress Notes (Signed)
Subjective:   Douglas Russell is a 71 y.o. male who presents for an Subsequent Medicare Annual Wellness Visit.  I connected with Bonne Dolores  today by telephone and verified that I am speaking with the correct person using two identifiers. Location patient: home Location provider: work Persons participating in the virtual visit: patient, provider.   I discussed the limitations, risks, security and privacy concerns of performing an evaluation and management service by telephone and the availability of in person appointments. I also discussed with the patient that there may be a patient responsible charge related to this service. The patient expressed understanding and verbally consented to this telephonic visit.    Interactive audio and video telecommunications were attempted between this provider and patient, however failed, due to patient having technical difficulties OR patient did not have access to video capability.  We continued and completed visit with audio only.    . Review of Systems     Cardiac Risk Factors include: advanced age (>83mn, >>14women);male gender     Objective:    Today's Vitals   There is no height or weight on file to calculate BMI.     08/14/2022    9:09 AM 02/19/2022    7:42 PM 10/11/2020    9:16 AM 07/13/2019    9:34 AM 05/11/2019    9:21 AM 03/04/2019    1:45 PM 03/02/2019    9:33 PM  Advanced Directives  Does Patient Have a Medical Advance Directive? No No No No No No No  Would patient like information on creating a medical advance directive? No - Patient declined  No - Patient declined;Yes (ED - Information included in AVS) Yes (MAU/Ambulatory/Procedural Areas - Information given) Yes (MAU/Ambulatory/Procedural Areas - Information given) No - Patient declined No - Patient declined    Current Medications (verified) Outpatient Encounter Medications as of 08/14/2022  Medication Sig   albuterol (VENTOLIN HFA) 108 (90 Base) MCG/ACT inhaler Inhale  1-2 puffs into the lungs every 6 (six) hours as needed for wheezing or shortness of breath. Ok to fill with preferred pro air   bimatoprost (LUMIGAN) 0.01 % SOLN Place 1 drop into both eyes at bedtime.    cetirizine (ZYRTEC) 10 MG tablet Take 1 tablet (10 mg total) by mouth daily.   CINNAMON PO Take 1,000 mg by mouth daily.    fluticasone (FLONASE) 50 MCG/ACT nasal spray Place 1-2 sprays into both nostrils daily.   fluticasone-salmeterol (ADVAIR) 250-50 MCG/ACT AEPB INHALE 1 DOSE BY MOUTH TWICE DAILY   Garlic 10737MG CAPS Take 1,000 mg by mouth daily.    Glucosamine-Chondroit-Vit C-Mn (GLUCOSAMINE 1500 COMPLEX PO) Take by mouth.   MAGNESIUM PO Take by mouth.   Melatonin 1 MG TABS Take 1 mg by mouth at bedtime as needed (for sleep).    Multiple Vitamins-Minerals (CENTRUM SILVER 50+MEN PO) Take by mouth.   Omega 3-6-9 Fatty Acids (OMEGA-3-6-9 PO) Take 2 capsules by mouth daily.   OVER THE COUNTER MEDICATION Place 1 Dose under the tongue 2 (two) times a day. CBD oil   POTASSIUM PO Take by mouth.   RHOPRESSA 0.02 % SOLN    SIMBRINZA 1-0.2 % SUSP Place 1 drop into both eyes 3 (three) times daily.    tamsulosin (FLOMAX) 0.4 MG CAPS capsule Take 1 capsule (0.4 mg total) by mouth at bedtime.   Turmeric Curcumin 500 MG CAPS Take 2 capsules by mouth daily.    No facility-administered encounter medications on file as of 08/14/2022.  Allergies (verified) Other, Shellfish allergy, and Trazodone and nefazodone   History: Past Medical History:  Diagnosis Date   Arthritis    L&R hip & pelvis    Asthma    has had since he was a child, states he only uses the inhaler PRN   Asthma    [facility] 10/09/2020   Cataract    Family history of adverse reaction to anesthesia    Pt. not exactly sure-sister"they almost lost her"during  a surgery   Glaucoma    Glaucoma    both eyes   History of blood transfusion 1995   /w pelvic fx.    Hx of adenomatous polyp of colon 01/25/2015   Osteoarthritis of left  hip 10/10/2014   Pelvic fracture ([place]) 09/28/1994   Pneumonia    in hosp[facility] ( /w pelvic fx)-    Past Surgical History:  Procedure Laterality Date   back cyst  12/29/2004   surgery x4 for cyst on the back    COLONOSCOPY  01/17/2015   Dr.Gessner   EYE SURGERY Bilateral 02/27/2015   Dr. Marylynn Pearson, [facility]   FRACTURE SURGERY Right 12/29/1993   hip   HERNIA REPAIR  60/63/0160   umbilical    INGUINAL HERNIA REPAIR Right 05/13/2019   Procedure: RIGHT INGUINAL HERNIA REPAIR WITH MESH;  Surgeon: Coralie Keens, MD;  Location: [facility];  Service: General;  Laterality: Right;  AND TAP BLOCK   JOINT REPLACEMENT N/A    [facility] 10/09/2020   PELVIC FRACTURE SURGERY  12/29/1993   TOTAL HIP ARTHROPLASTY Left 10/10/2014   dr Mardelle Matte   TOTAL HIP ARTHROPLASTY Left 10/10/2014   Procedure: TOTAL HIP ARTHROPLASTY;  Surgeon: Johnny Bridge, MD;  Location: [facility];  Service: Orthopedics;  Laterality: Left;   Family History  Problem Relation Age of Onset   Hypertension Mother    Diabetes Father    Hypertension Sister    Hypertension Sister    Cancer Maternal Grandmother    Colon cancer Neg Hx    Esophageal cancer Neg Hx    Rectal cancer Neg Hx    Stomach cancer Neg Hx    Social History   Socioeconomic History   Marital status: Single    Spouse name: Not on file   Number of children: 1   Years of education: Not on file   Highest education level: Not on file  Occupational History   Occupation: distribution    Employer: [employer]  Tobacco Use   Smoking status: Never   Smokeless tobacco: Never  Vaping Use   Vaping Use: Never used  Substance and Sexual Activity   Alcohol use: Yes    Alcohol/week: 2.0 standard drinks of alcohol    Types: 2 Glasses of wine per week    Comment: occasional glass of wine   Drug use: No    Comment: hemp oil under tongue at night   Sexual activity: Yes  Other Topics Concern   Not on file  Social History Narrative   Not on file   Social  Determinants of Health   Financial Resource Strain: Low Risk  (08/14/2022)   Overall Financial Resource Strain (CARDIA)    Difficulty of Paying Living Expenses: Not hard at all  Food Insecurity: No Food Insecurity (08/14/2022)   Hunger Vital Sign    Worried About Running Out of Food in the Last Year: Never true    Ran Out of Food in the Last Year: Never true  Transportation Needs: No Transportation Needs (08/14/2022)  PRAPARE - Hydrologist (Medical): No    Lack of Transportation (Non-Medical): No  Physical Activity: Sufficiently Active (08/14/2022)   Exercise Vital Sign    Days of Exercise per Week: 5 days    Minutes of Exercise per Session: 40 min  Stress: No Stress Concern Present (08/14/2022)   [place]    Feeling of Stress : Not at all  Social Connections: Moderately Isolated (08/14/2022)   Social Connection and Isolation Panel [NHANES]    Frequency of Communication with Friends and Family: Three times a week    Frequency of Social Gatherings with Friends and Family: Three times a week    Attends Religious Services: 1 to 4 times per year    Active Member of Clubs or Organizations: No    Attends Archivist Meetings: Never    Marital Status: Divorced    Tobacco Counseling Counseling given: Not Answered   Clinical Intake:  Pre-visit preparation completed: Yes  Pain : No/denies pain     Nutritional Risks: None Diabetes: No  How often do you need to have someone help you when you read instructions, pamphlets, or other written materials from your doctor or pharmacy?: 1 - Never What is the last grade level you completed in school?: college  Diabetic?no   Interpreter Needed?: No  Information entered by :: [place] [facility]    08/14/2022    9:11 AM  In your present state of health, do you have any difficulty performing the following  activities:  Hearing? 0  Vision? 0  Difficulty concentrating or making decisions? 0  Walking or climbing stairs? 0  Dressing or bathing? 0  Doing errands, shopping? 0  Preparing Food and eating ? N  Using the Toilet? N  In the past six months, have you accidently leaked urine? N  Do you have problems with loss of bowel control? N  Managing your Medications? N  Managing your Finances? N  Housekeeping or managing your Housekeeping? N    Patient Care Team: Wendie Agreste, MD as PCP - General (Family Medicine)  Indicate any recent Medical Services you may have received from other than [facility] providers in the past year (date may be approximate).     Assessment:   This is a routine wellness examination for L[place].  Hearing/Vision screen Vision Screening - Comments:: Annual eye exams wear glasses   Dietary issues and exercise activities discussed: Current Exercise Habits: Home exercise routine, Type of exercise: walking;strength training/weights, Time (Minutes): 40, Frequency (Times/Week): 5, Weekly Exercise (Minutes/Week): 200, Intensity: Mild, Exercise limited by: None identified   Goals Addressed   None    Depression Screen    08/14/2022    9:11 AM 08/14/2022    9:09 AM 01/30/2022   10:50 AM 05/22/2021    9:34 AM 02/20/2021    9:16 AM 10/11/2020    9:13 AM 08/15/2020    9:58 AM  PHQ 2/9 Scores  PHQ - 2 Score 0 0 0 0 0 0 0  PHQ- 9 Score    3       Fall Risk    08/14/2022    9:10 AM 01/30/2022   10:49 AM 02/20/2021    9:16 AM 10/11/2020    9:13 AM 08/15/2020    9:58 AM  Fall Risk   Falls in the past year? 0 0 0 0 0  Number falls in past yr: 0 0  0   Injury with Fall? 0 0  0   Risk for fall due to :  No Fall Risks     Follow up Falls evaluation completed;Education provided Falls evaluation completed Falls evaluation completed Falls evaluation completed;Education provided Falls evaluation completed    [place]:  Any stairs in or  around the home? Yes  If so, are there any without handrails? No  Home free of loose throw rugs in walkways, pet beds, electrical cords, etc? Yes  Adequate lighting in your home to reduce risk of falls? Yes   ASSISTIVE DEVICES UTILIZED TO PREVENT FALLS:  Life alert? No  Use of a cane, walker or w/c? No  Grab bars in the bathroom? No  Shower chair or bench in shower? No  Elevated toilet seat or a handicapped toilet? No     Cognitive Function:  Normal cognitive status assessed by telephone conversation by this Nurse Health Advisor. No abnormalities found.        08/14/2022    9:11 AM 10/11/2020    9:10 AM 07/13/2019    9:37 AM  6CIT Screen  What Year? 0 points 0 points 0 points  What month? 0 points 0 points 0 points  What time? 0 points 0 points 0 points  Count back from 20 0 points 0 points 0 points  Months in reverse 0 points 0 points 0 points  Repeat phrase 0 points 0 points 0 points  Total Score 0 points 0 points 0 points    Immunizations Immunization History  Administered Date(s) Administered   Influenza Split 09/28/2012   Influenza, High Dose Seasonal PF 09/12/2018, 08/30/2020   Influenza, Seasonal, Injecte, Preservative Fre 09/23/2013   Influenza,inj,Quad PF,6+ Mos 09/08/2014, 09/20/2015   Influenza-Unspecified 08/31/2017, 09/26/2018, 09/08/2019, 10/19/2021   Moderna Sars-Covid-2 Vaccination 02/05/2020, 03/21/2020, 10/19/2020, 03/28/2021   Pfizer Covid-19 Vaccine Bivalent Booster 27yr & up 09/12/2021   Pneumococcal Conjugate-13 09/20/2015   Pneumococcal Polysaccharide-23 09/23/2013, 11/01/2018   Tdap 09/23/2013   Zoster Recombinat (Shingrix) 08/31/2017   Zoster, Live 05/29/2010    TDAP status: Up to date  Flu Vaccine status: Up to date  Pneumococcal vaccine status: Up to date  Covid-19 vaccine status: Completed vaccines  Qualifies for Shingles Vaccine? Yes   Zostavax completed Yes   Shingrix Completed?: Yes  Screening Tests Health Maintenance   Topic Date Due   Zoster Vaccines- Shingrix (2 of 2) 10/26/2017   COVID-19 Vaccine (6 - Moderna series) 01/12/2022   COLONOSCOPY (Pts 45-413yrInsurance coverage will need to be confirmed)  01/17/2022   INFLUENZA VACCINE  07/29/2022   TETANUS/TDAP  09/24/2023   Pneumonia Vaccine 6555Years old  Completed   Hepatitis C Screening  Completed   HPV VACCINES  Aged Out    Health Maintenance  Health Maintenance Due  Topic Date Due   Zoster Vaccines- Shingrix (2 of 2) 10/26/2017   COVID-19 Vaccine (6 - Moderna series) 01/12/2022   COLONOSCOPY (Pts [id number] coverage will need to be confirmed)  01/17/2022   INFLUENZA VACCINE  07/29/2022    Colorectal cancer screening: Referral to GI placed Scheduled 09/15/2022. Pt aware the office will call re: appt.  Lung Cancer Screening: (Low Dose CT Chest recommended if Age 104-72-80ars, 30 pack-year currently smoking OR have quit w/in 15years.) does not qualify.   Lung Cancer Screening Referral: n/a  Additional Screening:  Hepatitis C Screening: does not qualify;  Vision Screening: Recommended annual ophthalmology exams for early detection of glaucoma and  other disorders of the eye. Is the patient up to date with their annual eye exam?  Yes  Who is the provider or what is the name of the office in which the patient attends annual eye exams? Dr.Whittiaker  If pt is not established with a provider, would they like to be referred to a provider to establish care? No .   Dental Screening: Recommended annual dental exams for proper oral hygiene  Community Resource Referral / Chronic Care Management: CRR required this visit?  No   CCM required this visit?  No      Plan:     I have personally reviewed and noted the following in the patient's chart:   Medical and social history Use of alcohol, tobacco or illicit drugs  Current medications and supplements including opioid prescriptions. Patient is not currently taking opioid  prescriptions. Functional ability and status Nutritional status Physical activity Advanced directives List of other physicians Hospitalizations, surgeries, and ER visits in previous 12 months Vitals Screenings to include cognitive, depression, and falls Referrals and appointments  In addition, I have reviewed and discussed with patient certain preventive protocols, quality metrics, and best practice recommendations. A written personalized care plan for preventive services as well as general preventive health recommendations were provided to patient.     Daphane Shepherd, LPN   01/03/2693   Nurse Notes: none

## 2022-08-26 ENCOUNTER — Encounter: Payer: Self-pay | Admitting: Internal Medicine

## 2022-09-07 ENCOUNTER — Encounter: Payer: Self-pay | Admitting: Certified Registered Nurse Anesthetist

## 2022-09-10 ENCOUNTER — Encounter: Payer: Self-pay | Admitting: Internal Medicine

## 2022-09-10 ENCOUNTER — Ambulatory Visit (AMBULATORY_SURGERY_CENTER): Payer: Medicare HMO | Admitting: Internal Medicine

## 2022-09-10 VITALS — BP 146/87 | HR 59 | Temp 98.0°F | Resp 15 | Ht 71.0 in | Wt 188.0 lb

## 2022-09-10 DIAGNOSIS — Z09 Encounter for follow-up examination after completed treatment for conditions other than malignant neoplasm: Secondary | ICD-10-CM

## 2022-09-10 DIAGNOSIS — D125 Benign neoplasm of sigmoid colon: Secondary | ICD-10-CM

## 2022-09-10 DIAGNOSIS — Z86010 Personal history of colonic polyps: Secondary | ICD-10-CM | POA: Diagnosis not present

## 2022-09-10 DIAGNOSIS — D123 Benign neoplasm of transverse colon: Secondary | ICD-10-CM

## 2022-09-10 DIAGNOSIS — D124 Benign neoplasm of descending colon: Secondary | ICD-10-CM | POA: Diagnosis not present

## 2022-09-10 MED ORDER — SODIUM CHLORIDE 0.9 % IV SOLN: 500.0000 mL | Freq: Once | INTRAVENOUS | Status: DC

## 2022-09-10 NOTE — Progress Notes (Signed)
Called to room to assist during endoscopic procedure.  Patient ID and intended procedure confirmed with present staff. Received instructions for my participation in the procedure from the performing physician.  

## 2022-09-10 NOTE — Op Note (Signed)
[facility] Patient Name: Douglas Russell Procedure Date: 09/10/2022 10:57 AM MRN: [id number] Endoscopist: Gatha Mayer , MD Age: 71 Referring MD:  Date of Birth: 1951/04/05 Gender: Male Account #: [id number] Procedure:                Colonoscopy Indications:              Surveillance: Personal history of adenomatous                            polyps on last colonoscopy > 5 years ago, Last                            colonoscopy: 2016 Medicines:                Monitored Anesthesia Care Procedure:                Pre-Anesthesia Assessment:                           - Prior to the procedure, a History and Physical                            was performed, and patient medications and                            allergies were reviewed. The patient's tolerance of                            previous anesthesia was also reviewed. The risks                            and benefits of the procedure and the sedation                            options and risks were discussed with the patient.                            All questions were answered, and informed consent                            was obtained. Prior Anticoagulants: The patient has                            taken no previous anticoagulant or antiplatelet                            agents. ASA Grade Assessment: II - A patient with                            mild systemic disease. After reviewing the risks                            and benefits, the patient was deemed in  satisfactory condition to undergo the procedure.                           After obtaining informed consent, the colonoscope                            was passed under direct vision. Throughout the                            procedure, the patient's blood pressure, pulse, and                            oxygen saturations were monitored continuously. The                            CF [id number] #[id number] was introduced through the  anus                            and advanced to the the cecum, identified by                            appendiceal orifice and ileocecal valve. The                            colonoscopy was performed without difficulty. The                            patient tolerated the procedure well. The quality                            of the bowel preparation was good. The ileocecal                            valve, appendiceal orifice, and rectum were                            photographed. The bowel preparation used was                            Miralax via split dose instruction. Scope In: 11:00:35 AM Scope Out: 11:23:43 AM Scope Withdrawal Time: 0 hours 18 minutes 55 seconds  Total Procedure Duration: 0 hours 23 minutes 8 seconds  Findings:                 The perianal and digital rectal examinations were                            normal.                           Four sessile polyps were found in the sigmoid                            colon, descending colon and transverse colon. The  polyps were diminutive in size. These polyps were                            removed with a cold snare. Resection and retrieval                            were complete. Verification of patient                            identification for the specimen was done. Estimated                            blood loss was minimal.                           The exam was otherwise without abnormality on                            direct and retroflexion views. Complications:            No immediate complications. Estimated Blood Loss:     Estimated blood loss was minimal. Impression:               - Four diminutive polyps in the sigmoid colon, in                            the descending colon and in the transverse colon,                            removed with a cold snare. Resected and retrieved.                           - The examination was otherwise normal on direct                             and retroflexion views.                           - Personal history of colonic polyp 2 mm adenoma                            removed 2016. Recommendation:           - Patient has a contact number available for                            emergencies. The signs and symptoms of potential                            delayed complications were discussed with the                            patient. Return to normal activities tomorrow.  Written discharge instructions were provided to the                            patient.                           - Resume previous diet.                           - Continue present medications.                           - Await pathology results.                           - No recommendation at this time regarding repeat                            colonoscopy due to age. Gatha Mayer, MD 09/10/2022 11:30:46 AM This report has been signed electronically.

## 2022-09-10 NOTE — Patient Instructions (Addendum)
I found and removed four tiny polyps today - all look benign.  I will let you know pathology results and when/if to have another routine colonoscopy by mail and/or My Chart.  I appreciate the opportunity to care for you.Gatha Mayer, MD, [facility]    Handout on polyps given to you today  Await pathology results on polyps  removed    YOU HAD AN ENDOSCOPIC PROCEDURE TODAY AT [facility]:   Refer to the procedure report that was given to you for any specific questions about what was found during the examination.  If the procedure report does not answer your questions, please call your gastroenterologist to clarify.  If you requested that your care partner not be given the details of your procedure findings, then the procedure report has been included in a sealed envelope for you to review at your convenience later.  YOU SHOULD EXPECT: Some feelings of bloating in the abdomen. Passage of more gas than usual.  Walking can help get rid of the air that was put into your GI tract during the procedure and reduce the bloating. If you had a lower endoscopy (such as a colonoscopy or flexible sigmoidoscopy) you may notice spotting of blood in your stool or on the toilet paper. If you underwent a bowel prep for your procedure, you may not have a normal bowel movement for a few days.  Please Note:  You might notice some irritation and congestion in your nose or some drainage.  This is from the oxygen used during your procedure.  There is no need for concern and it should clear up in a day or so.  SYMPTOMS TO REPORT IMMEDIATELY:  Following lower endoscopy (colonoscopy or flexible sigmoidoscopy):  Excessive amounts of blood in the stool  Significant tenderness or worsening of abdominal pains  Swelling of the abdomen that is new, acute  Fever of 100F or higher    For urgent or emergent issues, a gastroenterologist can be reached at any hour by calling [phone]. Do not use MyChart  messaging for urgent concerns.    DIET:  We do recommend a small meal at first, but then you may proceed to your regular diet.  Drink plenty of fluids but you should avoid alcoholic beverages for 24 hours.  ACTIVITY:  You should plan to take it easy for the rest of today and you should NOT DRIVE or use heavy machinery until tomorrow (because of the sedation medicines used during the test).    FOLLOW UP: Our staff will call the number listed on your records the next business day following your procedure.  We will call around 7:15- 8:00 am to check on you and address any questions or concerns that you may have regarding the information given to you following your procedure. If we do not reach you, we will leave a message.     If any biopsies were taken you will be contacted by phone or by letter within the next 1-3 weeks.  Please call us at [phone] if you have not heard about the biopsies in 3 weeks.    SIGNATURES/CONFIDENTIALITY: You and/or your care partner have signed paperwork which will be entered into your electronic medical record.  These signatures attest to the fact that that the information above on your After Visit Summary has been reviewed and is understood.  Full responsibility of the confidentiality of this discharge information lies with you and/or your care-partner.

## 2022-09-10 NOTE — Progress Notes (Signed)
Report given to PACU, vss 

## 2022-09-10 NOTE — Progress Notes (Signed)
[facility] History and Physical   Primary Care Physician:  Wendie Agreste, MD   Reason for Procedure:   Hx adenomatous colon polyp  Plan:    colonoscopy     HPI: Douglas Russell is a 71 y.o. male s/p removal of 2 mm adenoma from colon in 2016.   Past Medical History:  Diagnosis Date   Arthritis    L&R hip & pelvis    Asthma    has had since he was a child, states he only uses the inhaler PRN   Asthma    [employer]rees[employer] 10/09/2020   Cataract    Family history of adverse reaction to anesthesia    Pt. not exactly sure-sister"they almost lost her"during  a surgery   Glaucoma    Glaucoma    both eyes   History of blood transfusion 1995   /w pelvic fx.    Hx of adenomatous polyp of colon 01/25/2015   Osteoarthritis of left hip 10/10/2014   Pelvic fracture ([place]) 09/28/1994   Pneumonia    in hosp[facility] ( /w pelvic fx)-     Past Surgical History:  Procedure Laterality Date   back cyst  12/29/2004   surgery x4 for cyst on the back    COLONOSCOPY  01/17/2015   Dr.Othel Dicostanzo   EYE SURGERY Bilateral 02/27/2015   Dr. Marylynn Pearson, [facility]   FRACTURE SURGERY Right 12/29/1993   hip   HERNIA REPAIR  96/22/2979   umbilical    INGUINAL HERNIA REPAIR Right 05/13/2019   Procedure: RIGHT INGUINAL HERNIA REPAIR WITH MESH;  Surgeon: Coralie Keens, MD;  Location: [facility];  Service: General;  Laterality: Right;  AND TAP BLOCK   JOINT REPLACEMENT N/A    [employer]reesia 10/09/2020   PELVIC FRACTURE SURGERY  12/29/1993   TOTAL HIP ARTHROPLASTY Left 10/10/2014   dr Mardelle Matte   TOTAL HIP ARTHROPLASTY Left 10/10/2014   Procedure: TOTAL HIP ARTHROPLASTY;  Surgeon: Johnny Bridge, MD;  Location: [facility];  Service: Orthopedics;  Laterality: Left;    Prior to Admission medications   Medication Sig Start Date End Date Taking? Authorizing Provider  bimatoprost (LUMIGAN) 0.01 % SOLN Place 1 drop into both eyes at bedtime.    Yes [provider]  cetirizine (ZYRTEC) 10 MG tablet Take 1  tablet (10 mg total) by mouth daily. 01/30/22  Yes Wendie Agreste, MD  CINNAMON PO Take 1,000 mg by mouth daily.    Yes [provider]  fluticasone (FLONASE) 50 MCG/ACT nasal spray Place 1-2 sprays into both nostrils daily. 02/12/22  Yes Wendie Agreste, MD  fluticasone-salmeterol (ADVAIR) 250-50 MCG/ACT AEPB INHALE 1 DOSE BY MOUTH TWICE DAILY 07/22/22  Yes Wendie Agreste, MD  Garlic 8921 MG CAPS Take 1,000 mg by mouth daily.    Yes [provider]  Glucosamine-Chondroit-Vit C-Mn (GLUCOSAMINE 1500 COMPLEX PO) Take by mouth.   Yes [provider]  MAGNESIUM PO Take by mouth.   Yes [provider]  Melatonin 1 MG TABS Take 1 mg by mouth at bedtime as needed (for sleep).    Yes [provider]  Multiple Vitamins-Minerals (CENTRUM SILVER 50+MEN PO) Take by mouth.   Yes [provider]  Omega 3-6-9 Fatty Acids (OMEGA-3-6-9 PO) Take 2 capsules by mouth daily.   Yes [provider]  OVER THE COUNTER MEDICATION Place 1 Dose under the tongue 2 (two) times a day. CBD oil   Yes [provider]  POTASSIUM PO Take by mouth.  Yes [provider]  RHOPRESSA 0.02 % SOLN  11/22/19  Yes [provider]  SIMBRINZA 1-0.2 % SUSP Place 1 drop into both eyes 3 (three) times daily.  02/05/19  Yes [provider]  tamsulosin (FLOMAX) 0.4 MG CAPS capsule Take 1 capsule (0.4 mg total) by mouth at bedtime. 01/30/22  Yes Wendie Agreste, MD  Turmeric Curcumin 500 MG CAPS Take 2 capsules by mouth daily.    Yes [provider]  albuterol (VENTOLIN HFA) 108 (90 Base) MCG/ACT inhaler Inhale 1-2 puffs into the lungs every 6 (six) hours as needed for wheezing or shortness of breath. Ok to fill with preferred pro air 02/20/21   Wendie Agreste, MD    Current Outpatient Medications  Medication Sig Dispense Refill   bimatoprost (LUMIGAN) 0.01 % SOLN Place 1 drop into both eyes at bedtime.      cetirizine (ZYRTEC) 10 MG  tablet Take 1 tablet (10 mg total) by mouth daily. 90 tablet 2   CINNAMON PO Take 1,000 mg by mouth daily.      fluticasone (FLONASE) 50 MCG/ACT nasal spray Place 1-2 sprays into both nostrils daily. 16 g 6   fluticasone-salmeterol (ADVAIR) 250-50 MCG/ACT AEPB INHALE 1 DOSE BY MOUTH TWICE DAILY 60 each 0   Garlic 6440 MG CAPS Take 1,000 mg by mouth daily.      Glucosamine-Chondroit-Vit C-Mn (GLUCOSAMINE 1500 COMPLEX PO) Take by mouth.     MAGNESIUM PO Take by mouth.     Melatonin 1 MG TABS Take 1 mg by mouth at bedtime as needed (for sleep).      Multiple Vitamins-Minerals (CENTRUM SILVER 50+MEN PO) Take by mouth.     Omega 3-6-9 Fatty Acids (OMEGA-3-6-9 PO) Take 2 capsules by mouth daily.     OVER THE COUNTER MEDICATION Place 1 Dose under the tongue 2 (two) times a day. CBD oil     POTASSIUM PO Take by mouth.     RHOPRESSA 0.02 % SOLN      SIMBRINZA 1-0.2 % SUSP Place 1 drop into both eyes 3 (three) times daily.      tamsulosin (FLOMAX) 0.4 MG CAPS capsule Take 1 capsule (0.4 mg total) by mouth at bedtime. 90 capsule 1   Turmeric Curcumin 500 MG CAPS Take 2 capsules by mouth daily.      albuterol (VENTOLIN HFA) 108 (90 Base) MCG/ACT inhaler Inhale 1-2 puffs into the lungs every 6 (six) hours as needed for wheezing or shortness of breath. Ok to fill with preferred pro air 18 g 1   Current Facility-Administered Medications  Medication Dose Route Frequency Provider Last Rate Last Admin   0.9 %  sodium chloride infusion  500 mL Intravenous Once Gatha Mayer, MD        Allergies as of 09/10/2022 - Review Complete 09/10/2022  Allergen Reaction Noted   Other Hives and Shortness Of Breath 10/09/2020   Shellfish allergy Anaphylaxis and Swelling 09/26/2014   Trazodone and nefazodone  09/23/2013    Family History  Problem Relation Age of Onset   Hypertension Mother    Diabetes Father    Hypertension Sister    Hypertension Sister    Cancer Maternal Grandmother    Colon cancer Neg Hx     Esophageal cancer Neg Hx    Rectal cancer Neg Hx    Stomach cancer Neg Hx     Social History   Socioeconomic History   Marital status: Single    Spouse name: Not on  file   Number of children: 1   Years of education: Not on file   Highest education level: Not on file  Occupational History   Occupation: distribution    Employer: [employer]  Tobacco Use   Smoking status: Never   Smokeless tobacco: Never  Vaping Use   Vaping Use: Never used  Substance and Sexual Activity   Alcohol use: Yes    Alcohol/week: 2.0 standard drinks of alcohol    Types: 2 Glasses of wine per week    Comment: occasional glass of wine   Drug use: No    Comment: hemp oil under tongue at night   Sexual activity: Yes  Other Topics Concern   Not on file  Social History Narrative   Not on file   Social Determinants of Health   Financial Resource Strain: Low Risk  (08/14/2022)   Overall Financial Resource Strain (CARDIA)    Difficulty of Paying Living Expenses: Not hard at all  Food Insecurity: No Food Insecurity (08/14/2022)   Hunger Vital Sign    Worried About Running Out of Food in the Last Year: Never true    Ran Out of Food in the Last Year: Never true  Transportation Needs: No Transportation Needs (08/14/2022)   PRAPARE - Hydrologist (Medical): No    Lack of Transportation (Non-Medical): No  Physical Activity: Sufficiently Active (08/14/2022)   Exercise Vital Sign    Days of Exercise per Week: 5 days    Minutes of Exercise per Session: 40 min  Stress: No Stress Concern Present (08/14/2022)   [place]    Feeling of Stress : Not at all  Social Connections: Moderately Isolated (08/14/2022)   Social Connection and Isolation Panel [NHANES]    Frequency of Communication with Friends and Family: Three times a week    Frequency of Social Gatherings with Friends and Family: Three times a week     Attends Religious Services: 1 to 4 times per year    Active Member of Clubs or Organizations: No    Attends Archivist Meetings: Never    Marital Status: Divorced  Human resources officer Violence: Not At Risk (08/14/2022)   Humiliation, Afraid, Rape, and Kick questionnaire    Fear of Current or Ex-Partner: No    Emotionally Abused: No    Physically Abused: No    Sexually Abused: No    Review of Systems:  All other review of systems negative except as mentioned in the HPI.  Physical Exam: Vital signs BP 131/85   Pulse 74   Temp 98 F (36.7 C)   Ht '5\' 11"'$  (1.803 m)   Wt 188 lb (85.3 kg)   SpO2 98%   BMI 26.22 kg/m   General:   Alert,  Well-developed, well-nourished, pleasant and cooperative in NAD Lungs:  Clear throughout to auscultation.   Heart:  Regular rate and rhythm; no murmurs, clicks, rubs,  or gallops. Abdomen:  Soft, nontender and nondistended. Normal bowel sounds.   Neuro/Psych:  Alert and cooperative. Normal mood and affect. A and O x 3   '@Asra Gambrel'$  Simonne Maffucci, MD, [facility] [phone] (pager) 09/10/2022 10:52 AM@

## 2022-09-11 ENCOUNTER — Telehealth: Payer: Self-pay

## 2022-09-11 NOTE — Telephone Encounter (Signed)
  Follow up Call-     09/10/2022   10:38 AM  Call back number  Post procedure Call Back phone  # [phone]  Permission to leave phone message Yes     Patient questions:  Do you have a fever, pain , or abdominal swelling? No. Pain Score  0 *  Have you tolerated food without any problems? Yes.    Have you been able to return to your normal activities? Yes.    Do you have any questions about your discharge instructions: Diet   No. Medications  No. Follow up visit  No.  Do you have questions or concerns about your Care? No.  Actions: * If pain score is 4 or above: No action needed, pain <4.

## 2022-09-16 ENCOUNTER — Encounter: Payer: Self-pay | Admitting: Internal Medicine

## 2022-09-17 ENCOUNTER — Other Ambulatory Visit: Payer: Self-pay | Admitting: Family Medicine

## 2022-09-17 DIAGNOSIS — J4520 Mild intermittent asthma, uncomplicated: Secondary | ICD-10-CM

## 2022-09-18 ENCOUNTER — Other Ambulatory Visit: Payer: Self-pay | Admitting: Family

## 2022-09-18 DIAGNOSIS — J4520 Mild intermittent asthma, uncomplicated: Secondary | ICD-10-CM

## 2022-09-18 MED ORDER — FLUTICASONE-SALMETEROL 250-50 MCG/ACT IN AEPB: INHALATION_SPRAY | RESPIRATORY_TRACT | 1 refills | Status: DC

## 2022-09-18 MED ORDER — FLUTICASONE-SALMETEROL 250-50 MCG/ACT IN AEPB: INHALATION_SPRAY | RESPIRATORY_TRACT | 0 refills | Status: DC

## 2022-09-29 ENCOUNTER — Other Ambulatory Visit: Payer: Self-pay | Admitting: Family Medicine

## 2022-09-29 DIAGNOSIS — R351 Nocturia: Secondary | ICD-10-CM

## 2022-10-15 DIAGNOSIS — H2513 Age-related nuclear cataract, bilateral: Secondary | ICD-10-CM | POA: Diagnosis not present

## 2022-10-15 DIAGNOSIS — H401132 Primary open-angle glaucoma, bilateral, moderate stage: Secondary | ICD-10-CM | POA: Diagnosis not present

## 2022-11-12 DIAGNOSIS — H401132 Primary open-angle glaucoma, bilateral, moderate stage: Secondary | ICD-10-CM | POA: Diagnosis not present

## 2022-11-12 DIAGNOSIS — H2513 Age-related nuclear cataract, bilateral: Secondary | ICD-10-CM | POA: Diagnosis not present

## 2022-11-13 ENCOUNTER — Other Ambulatory Visit: Payer: Self-pay | Admitting: Family Medicine

## 2022-11-13 DIAGNOSIS — J309 Allergic rhinitis, unspecified: Secondary | ICD-10-CM

## 2022-11-15 ENCOUNTER — Other Ambulatory Visit: Payer: Self-pay | Admitting: Family Medicine

## 2022-11-15 DIAGNOSIS — R351 Nocturia: Secondary | ICD-10-CM

## 2022-11-15 DIAGNOSIS — J309 Allergic rhinitis, unspecified: Secondary | ICD-10-CM

## 2022-11-17 MED ORDER — TAMSULOSIN HCL 0.4 MG PO CAPS: 0.4000 mg | ORAL_CAPSULE | Freq: Every day | ORAL | 0 refills | Status: DC

## 2022-12-31 DIAGNOSIS — H2513 Age-related nuclear cataract, bilateral: Secondary | ICD-10-CM | POA: Diagnosis not present

## 2022-12-31 DIAGNOSIS — H401132 Primary open-angle glaucoma, bilateral, moderate stage: Secondary | ICD-10-CM | POA: Diagnosis not present

## 2023-01-28 DIAGNOSIS — H2513 Age-related nuclear cataract, bilateral: Secondary | ICD-10-CM | POA: Diagnosis not present

## 2023-01-28 DIAGNOSIS — H401132 Primary open-angle glaucoma, bilateral, moderate stage: Secondary | ICD-10-CM | POA: Diagnosis not present

## 2023-01-28 DIAGNOSIS — H04123 Dry eye syndrome of bilateral lacrimal glands: Secondary | ICD-10-CM | POA: Diagnosis not present

## 2023-02-10 ENCOUNTER — Other Ambulatory Visit: Payer: Self-pay | Admitting: Family Medicine

## 2023-02-10 DIAGNOSIS — J309 Allergic rhinitis, unspecified: Secondary | ICD-10-CM

## 2023-03-15 ENCOUNTER — Other Ambulatory Visit: Payer: Self-pay | Admitting: Family Medicine

## 2023-03-15 DIAGNOSIS — J4520 Mild intermittent asthma, uncomplicated: Secondary | ICD-10-CM

## 2023-03-25 DIAGNOSIS — Z125 Encounter for screening for malignant neoplasm of prostate: Secondary | ICD-10-CM | POA: Diagnosis not present

## 2023-03-27 ENCOUNTER — Other Ambulatory Visit: Payer: Self-pay | Admitting: Family Medicine

## 2023-03-27 DIAGNOSIS — R351 Nocturia: Secondary | ICD-10-CM

## 2023-04-01 DIAGNOSIS — N401 Enlarged prostate with lower urinary tract symptoms: Secondary | ICD-10-CM | POA: Diagnosis not present

## 2023-04-01 DIAGNOSIS — R351 Nocturia: Secondary | ICD-10-CM | POA: Diagnosis not present

## 2023-04-02 DIAGNOSIS — H401132 Primary open-angle glaucoma, bilateral, moderate stage: Secondary | ICD-10-CM | POA: Diagnosis not present

## 2023-04-02 DIAGNOSIS — H2513 Age-related nuclear cataract, bilateral: Secondary | ICD-10-CM | POA: Diagnosis not present

## 2023-04-02 DIAGNOSIS — H04123 Dry eye syndrome of bilateral lacrimal glands: Secondary | ICD-10-CM | POA: Diagnosis not present

## 2023-04-06 ENCOUNTER — Other Ambulatory Visit: Payer: Self-pay | Admitting: Family Medicine

## 2023-04-06 DIAGNOSIS — J4520 Mild intermittent asthma, uncomplicated: Secondary | ICD-10-CM

## 2023-04-29 DIAGNOSIS — H2513 Age-related nuclear cataract, bilateral: Secondary | ICD-10-CM | POA: Diagnosis not present

## 2023-04-29 DIAGNOSIS — H401132 Primary open-angle glaucoma, bilateral, moderate stage: Secondary | ICD-10-CM | POA: Diagnosis not present

## 2023-04-29 DIAGNOSIS — H04123 Dry eye syndrome of bilateral lacrimal glands: Secondary | ICD-10-CM | POA: Diagnosis not present

## 2023-05-01 ENCOUNTER — Other Ambulatory Visit: Payer: Self-pay | Admitting: Family Medicine

## 2023-05-01 DIAGNOSIS — J4520 Mild intermittent asthma, uncomplicated: Secondary | ICD-10-CM

## 2023-05-04 ENCOUNTER — Telehealth: Payer: Self-pay

## 2023-05-04 NOTE — Telephone Encounter (Signed)
Left pt a vm stating we refused his Refill request for Flonase he needs an apt w/ Dr Neva Seat . Last OV was 2/23

## 2023-05-10 ENCOUNTER — Other Ambulatory Visit: Payer: Self-pay | Admitting: Family Medicine

## 2023-05-10 DIAGNOSIS — J309 Allergic rhinitis, unspecified: Secondary | ICD-10-CM

## 2023-05-13 ENCOUNTER — Encounter: Payer: Self-pay | Admitting: Family Medicine

## 2023-05-13 ENCOUNTER — Ambulatory Visit (INDEPENDENT_AMBULATORY_CARE_PROVIDER_SITE_OTHER): Payer: Medicare HMO | Admitting: Family Medicine

## 2023-05-13 DIAGNOSIS — R351 Nocturia: Secondary | ICD-10-CM

## 2023-05-13 DIAGNOSIS — J309 Allergic rhinitis, unspecified: Secondary | ICD-10-CM

## 2023-05-13 DIAGNOSIS — J4520 Mild intermittent asthma, uncomplicated: Secondary | ICD-10-CM | POA: Diagnosis not present

## 2023-05-13 MED ORDER — CETIRIZINE HCL 10 MG PO TABS: 10.0000 mg | ORAL_TABLET | Freq: Every day | ORAL | 3 refills | Status: DC

## 2023-05-13 MED ORDER — FLUTICASONE-SALMETEROL 250-50 MCG/ACT IN AEPB: 1.0000 | INHALATION_SPRAY | Freq: Two times a day (BID) | RESPIRATORY_TRACT | 11 refills | Status: DC

## 2023-05-13 MED ORDER — TAMSULOSIN HCL 0.4 MG PO CAPS: 0.4000 mg | ORAL_CAPSULE | Freq: Every day | ORAL | 3 refills | Status: DC

## 2023-05-13 NOTE — Patient Instructions (Signed)
Glad to hear the Advair has worked well for asthma, continue that twice per day with albuterol only as needed.  If you do require albuterol more frequently, let me know.  No change in medication for urination but if you do have persistent or worsening symptoms it appears your urologist had another medication option, call their office to discuss that medication if needed.  Please follow-up with me in 6 months for a physical at that time.  Happy to see you sooner if needed.  Take care.

## 2023-05-13 NOTE — Progress Notes (Signed)
Subjective:  Patient ID: Douglas Russell, male    DOB: Sep 21, 1951  Age: 72 y.o. MRN: [id number]  CC:  Chief Complaint  Patient presents with   Asthma   Nocturia   Follow-up    HPI [name] presents for   Nocturia Chronic, has been followed by urology, treated with Flomax, fluid avoidance before bedtime. Appointment noted April 18 with Dr. Cardell Peach.  BPH with LUTS, predominant symptom with nocturia 3 times per night, continue tamsulosin, mirabegron was offered but declined, 1 year follow-up planned.  PSA 0.7 in March, plan for 1 year follow-up. No new symptoms. Flomax working ok.   Mild intermittent asthma Last discussed in 2023.  Was taking Advair once per day with second dose only if needed few times per week, had albuterol but was not needing and cetirizine for allergies at that time. Currently on Advair 250/50- using BID consistently. Works in [employer]. [employer]. Working ok. Albuterol - recent use when out of advair (out past week) few uses of albuterol past week. Usually not needing albuterol on Advair.  Still taking cetirizine 10 mg daily. Ran out - some stuffiness off med, controlled on meds.   Health maintenance Shingles vaccine - recommended at pharmacy if he has not have second dose.  COVID-vaccine/booster recommended at his pharmacy in fall. Had covid booster 2months ago.   Immunization History  Administered Date(s) Administered   Influenza Split 09/28/2012   Influenza, High Dose Seasonal PF 09/12/2018, 08/30/2020   Influenza, Seasonal, Injecte, Preservative Fre 09/23/2013   Influenza,inj,Quad PF,6+ Mos 09/08/2014, 09/20/2015   Influenza-Unspecified 08/31/2017, 09/26/2018, 09/08/2019, 10/19/2021   Moderna Sars-Covid-2 Vaccination 02/05/2020, 03/21/2020, 10/19/2020, 03/28/2021   Pfizer Covid-19 Vaccine Bivalent Booster 41yrs & up 09/12/2021   Pneumococcal Conjugate-13 09/20/2015   Pneumococcal Polysaccharide-23 09/23/2013, 11/01/2018   Tdap 09/23/2013   Zoster  Recombinat (Shingrix) 08/31/2017   Zoster, Live 05/29/2010    History Patient Active Problem List   Diagnosis Date Noted   Hx of adenomatous polyp of colon 01/25/2015   Status post left hip replacement 10/13/2014   Asthma, chronic 10/13/2014   Osteoarthritis of left hip 10/10/2014   Hip arthritis 10/10/2014   History of asthma 08/08/2014   History of umbilical hernia 08/08/2014   History of pelvic fracture 08/08/2014   Past Medical History:  Diagnosis Date   Arthritis    L&R hip & pelvis    Asthma    has had since he was a child, states he only uses the inhaler PRN   Asthma    Phreesia 10/09/2020   Cataract    Family history of adverse reaction to anesthesia    Pt. not exactly sure-sister"they almost lost her"during  a surgery   Glaucoma    Glaucoma    both eyes   History of blood transfusion 1995   /w pelvic fx.    Hx of adenomatous polyp of colon 01/25/2015   Osteoarthritis of left hip 10/10/2014   Pelvic fracture (HCC) 09/28/1994   Pneumonia    in hosp.,[facility] [address] ( /w pelvic fx)-    Past Surgical History:  Procedure Laterality Date   back cyst  12/29/2004   surgery x4 for cyst on the back    COLONOSCOPY  01/17/2015   Dr.Gessner   EYE SURGERY Bilateral 02/27/2015   Dr. Chalmers Guest, [facility]   FRACTURE SURGERY Right 12/29/1993   hip   HERNIA REPAIR  12/29/2004   umbilical    INGUINAL HERNIA REPAIR Right 05/13/2019   Procedure: RIGHT INGUINAL HERNIA REPAIR  WITH MESH;  Surgeon: Abigail Miyamoto, MD;  Location: [facility] OR;  Service: General;  Laterality: Right;  AND TAP BLOCK   JOINT REPLACEMENT N/A    Phreesia 10/09/2020   PELVIC FRACTURE SURGERY  12/29/1993   TOTAL HIP ARTHROPLASTY Left 10/10/2014   dr Dion Saucier   TOTAL HIP ARTHROPLASTY Left 10/10/2014   Procedure: TOTAL HIP ARTHROPLASTY;  Surgeon: Eulas Post, MD;  Location: [facility] OR;  Service: Orthopedics;  Laterality: Left;   Allergies  Allergen Reactions   Other Hives and Shortness Of Breath   Shellfish Allergy  Anaphylaxis and Swelling    Throat swells closed," like there is a block of cement in my throat"    Trazodone And Nefazodone     Jittery / shaking    Prior to Admission medications   Medication Sig Start Date End Date Taking? Authorizing Provider  albuterol (VENTOLIN HFA) 108 (90 Base) MCG/ACT inhaler Inhale 1-2 puffs into the lungs every 6 (six) hours as needed for wheezing or shortness of breath. Ok to fill with preferred pro air 02/20/21  Yes Shade Flood, MD  bimatoprost (LUMIGAN) 0.01 % SOLN Place 1 drop into both eyes at bedtime.    Yes [provider]  CINNAMON PO Take 1,000 mg by mouth daily.    Yes [provider]  EQ ALLERGY RELIEF, CETIRIZINE, 10 MG tablet Take 1 tablet by mouth once daily 02/10/23  Yes Shade Flood, MD  fluticasone [facility]) 50 MCG/ACT nasal spray Place 1-2 sprays into both nostrils daily. 02/12/22  Yes Shade Flood, MD  fluticasone-salmeterol (ADVAIR) 250-50 MCG/ACT AEPB INHALE 1 DOSE BY MOUTH TWICE DAILY 04/06/23  Yes Shade Flood, MD  Garlic 1000 MG CAPS Take 1,000 mg by mouth daily.    Yes [provider]  Glucosamine-Chondroit-Vit C-Mn (GLUCOSAMINE 1500 COMPLEX PO) Take by mouth.   Yes [provider]  MAGNESIUM PO Take by mouth.   Yes [provider]  Melatonin 1 MG TABS Take 1 mg by mouth at bedtime as needed (for sleep).    Yes [provider]  Multiple Vitamins-Minerals (CENTRUM SILVER 50+MEN PO) Take by mouth.   Yes [provider]  Omega 3-6-9 Fatty Acids (OMEGA-3-6-9 PO) Take 2 capsules by mouth daily.   Yes [provider]  OVER THE COUNTER MEDICATION Place 1 Dose under the tongue 2 (two) times a day. CBD oil   Yes [provider]  POTASSIUM PO Take by mouth.   Yes [provider]  RHOPRESSA 0.02 % SOLN  11/22/19  Yes [provider]  tamsulosin (FLOMAX) 0.4 MG CAPS capsule Take 1 capsule by mouth at bedtime 03/30/23  Yes Shade Flood, MD   Turmeric Curcumin 500 MG CAPS Take 2 capsules by mouth daily.    Yes [provider]   Social History   Socioeconomic History   Marital status: Single    Spouse name: Not on file   Number of children: 1   Years of education: Not on file   Highest education level: Some college, no degree  Occupational History   Occupation: distribution    Employer: [employer]  Tobacco Use   Smoking status: Never   Smokeless tobacco: Never  Vaping Use   Vaping Use: Never used  Substance and Sexual Activity   Alcohol use: Yes    Alcohol/week: 2.0 standard drinks of alcohol    Types: 2 Glasses of wine per week    Comment: occasional glass of wine  Drug use: No    Comment: hemp oil under tongue at night   Sexual activity: Yes  Other Topics Concern   Not on file  Social History Narrative   Not on file   Social Determinants of Health   Financial Resource Strain: Low Risk  (05/12/2023)   Overall Financial Resource Strain (CARDIA)    Difficulty of Paying Living Expenses: Not hard at all  Food Insecurity: No Food Insecurity (05/12/2023)   Hunger Vital Sign    Worried About Running Out of Food in the Last Year: Never true    Ran Out of Food in the Last Year: Never true  Transportation Needs: No Transportation Needs (05/12/2023)   PRAPARE - Administrator, Civil Service (Medical): No    Lack of Transportation (Non-Medical): No  Physical Activity: Sufficiently Active (05/12/2023)   Exercise Vital Sign    Days of Exercise per Week: 7 days    Minutes of Exercise per Session: 150+ min  Stress: No Stress Concern Present (05/12/2023)   [employer] of Occupational Health - Occupational Stress Questionnaire    Feeling of Stress : Not at all  Social Connections: Moderately Isolated (05/12/2023)   Social Connection and Isolation Panel [NHANES]    Frequency of Communication with Friends and Family: More than three times a week    Frequency of Social Gatherings with  Friends and Family: More than three times a week    Attends Religious Services: More than 4 times per year    Active Member of [employer] or Organizations: No    Attends Banker Meetings: Never    Marital Status: Divorced  Catering manager Violence: Not At Risk (08/14/2022)   Humiliation, Afraid, Rape, and Kick questionnaire    Fear of Current or Ex-Partner: No    Emotionally Abused: No    Physically Abused: No    Sexually Abused: No    Review of Systems  Constitutional:  Negative for fatigue, fever and unexpected weight change.  Eyes:  Negative for visual disturbance.  Respiratory:  Negative for cough, chest tightness and shortness of breath.   Cardiovascular:  Negative for chest pain, palpitations and leg swelling.  Gastrointestinal:  Negative for abdominal pain and blood in stool.  Neurological:  Negative for dizziness, light-headedness and headaches.     Objective:   Vitals:   05/13/23 1047 05/13/23 1110  BP: (!) 140/80 132/86  Pulse: 68   Resp: 18   Temp: 98.5 F (36.9 C)   TempSrc: Oral   SpO2: 98%   Weight: 189 lb (85.7 kg)   Height: 5\' 11"  (1.803 m)      Physical Exam Vitals reviewed.  Constitutional:      Appearance: He is well-developed.  HENT:     Head: Normocephalic and atraumatic.  Neck:     Vascular: No carotid bruit or JVD.  Cardiovascular:     Rate and Rhythm: Normal rate and regular rhythm.     Heart sounds: Normal heart sounds. No murmur heard. Pulmonary:     Effort: Pulmonary effort is normal.     Breath sounds: Normal breath sounds. No rales.  Musculoskeletal:     Right lower leg: No edema.     Left lower leg: No edema.  Skin:    General: Skin is warm and dry.  Neurological:     Mental Status: He is alert and oriented to person, place, and time.  Psychiatric:        Mood and Affect: Mood normal.  Assessment & Plan:  ARIZ LIFSEY is a 72 y.o. male . Allergic rhinitis, unspecified seasonality, unspecified trigger  - Plan: cetirizine (EQ ALLERGY RELIEF, CETIRIZINE,) 10 MG tablet  -Increase symptoms recently off meds but no fever, reassuring exam.  Restart cetirizine, option over-the-counter Zyrtec in the future if he runs out.  RTC precautions.  Mild intermittent asthma without complication - Plan: fluticasone-salmeterol (ADVAIR) 250-50 MCG/ACT AEPB  -Stable, refill Advair, slight increased need for albuterol off Advair but stable prior.  Lungs clear.  9-month follow-up for physical.  Nocturia - Plan: tamsulosin (FLOMAX) 0.4 MG CAPS capsule  -Stable, but persistent nocturia.  Reviewed last urology note as above, option of Myrbetriq, advised patient to contact urologist if additional med needed.  Meds ordered this encounter  Medications   cetirizine (EQ ALLERGY RELIEF, CETIRIZINE,) 10 MG tablet    Sig: Take 1 tablet (10 mg total) by mouth daily.    Dispense:  90 tablet    Refill:  3   fluticasone-salmeterol (ADVAIR) 250-50 MCG/ACT AEPB    Sig: Inhale 1 puff into the lungs in the morning and at bedtime.    Dispense:  60 each    Refill:  11   tamsulosin (FLOMAX) 0.4 MG CAPS capsule    Sig: Take 1 capsule (0.4 mg total) by mouth at bedtime.    Dispense:  90 capsule    Refill:  3   Patient Instructions  Glad to hear the Advair has worked well for asthma, continue that twice per day with albuterol only as needed.  If you do require albuterol more frequently, let me know.  No change in medication for urination but if you do have persistent or worsening symptoms it appears your urologist had another medication option, call their office to discuss that medication if needed.  Please follow-up with me in 6 months for a physical at that time.  Happy to see you sooner if needed.  Take care.     Signed,   Meredith Staggers, MD [facility], [facility] 05/13/23 11:10 AM

## 2023-07-29 ENCOUNTER — Encounter (INDEPENDENT_AMBULATORY_CARE_PROVIDER_SITE_OTHER): Payer: Self-pay

## 2023-08-20 ENCOUNTER — Ambulatory Visit (INDEPENDENT_AMBULATORY_CARE_PROVIDER_SITE_OTHER): Payer: Medicare HMO | Admitting: *Deleted

## 2023-08-20 DIAGNOSIS — Z0000 Encounter for general adult medical examination without abnormal findings: Secondary | ICD-10-CM | POA: Diagnosis not present

## 2023-08-20 NOTE — Progress Notes (Signed)
Subjective:   Douglas Russell is a 72 y.o. male who presents for Medicare Annual/Subsequent preventive examination.  Visit Complete: Virtual  I connected with  [name] on 08/20/23 by a audio enabled telemedicine application and verified that I am speaking with the correct person using two identifiers.  Patient Location: Home  Provider Location: Home Office  I discussed the limitations of evaluation and management by telemedicine. The patient expressed understanding and agreed to proceed.  Vital Signs: Unable to obtain new vitals due to this being a telehealth visit.    Review of Systems     Cardiac Risk Factors include: advanced age (>70men, >23 women);male gender     Objective:    Today's Vitals   08/20/23 1330  PainSc: 4    There is no height or weight on file to calculate BMI.     08/20/2023    1:34 PM 08/14/2022    9:09 AM 02/19/2022    7:42 PM 10/11/2020    9:16 AM 07/13/2019    9:34 AM 05/11/2019    9:21 AM 03/04/2019    1:45 PM  Advanced Directives  Does Patient Have a Medical Advance Directive? No No No No No No No  Would patient like information on creating a medical advance directive? No - Patient declined No - Patient declined  No - Patient declined;Yes (ED - Information included in AVS) Yes (MAU/Ambulatory/Procedural Areas - Information given) Yes (MAU/Ambulatory/Procedural Areas - Information given) No - Patient declined    Current Medications (verified) Outpatient Encounter Medications as of 08/20/2023  Medication Sig   albuterol (VENTOLIN HFA) 108 (90 Base) MCG/ACT inhaler Inhale 1-2 puffs into the lungs every 6 (six) hours as needed for wheezing or shortness of breath. Ok to fill with preferred pro air   bimatoprost (LUMIGAN) 0.01 % SOLN Place 1 drop into both eyes at bedtime.    cetirizine (EQ ALLERGY RELIEF, CETIRIZINE,) 10 MG tablet Take 1 tablet (10 mg total) by mouth daily.   CINNAMON PO Take 1,000 mg by mouth daily.    fluticasone (FLONASE)  50 MCG/ACT nasal spray Place 1-2 sprays into both nostrils daily.   fluticasone-salmeterol (ADVAIR) 250-50 MCG/ACT AEPB Inhale 1 puff into the lungs in the morning and at bedtime.   Garlic 1000 MG CAPS Take 1,000 mg by mouth daily.    Glucosamine-Chondroit-Vit C-Mn (GLUCOSAMINE 1500 COMPLEX PO) Take by mouth.   MAGNESIUM PO Take by mouth.   Melatonin 1 MG TABS Take 1 mg by mouth at bedtime as needed (for sleep).    Multiple Vitamins-Minerals (CENTRUM SILVER 50+MEN PO) Take by mouth.   Omega 3-6-9 Fatty Acids (OMEGA-3-6-9 PO) Take 2 capsules by mouth daily.   OVER THE COUNTER MEDICATION Place 1 Dose under the tongue 2 (two) times a day. CBD oil   POTASSIUM PO Take by mouth.   RHOPRESSA 0.02 % SOLN    tamsulosin (FLOMAX) 0.4 MG CAPS capsule Take 1 capsule (0.4 mg total) by mouth at bedtime.   Turmeric Curcumin 500 MG CAPS Take 2 capsules by mouth daily.    No facility-administered encounter medications on file as of 08/20/2023.    Allergies (verified) Other, Shellfish allergy, and Trazodone and nefazodone   History: Past Medical History:  Diagnosis Date   Arthritis    L&R hip & pelvis    Asthma    has had since he was a child, states he only uses the inhaler PRN   Asthma    [employer]rees[employer] 10/09/2020   Cataract  Family history of adverse reaction to anesthesia    Pt. not exactly sure-sister"they almost lost her"during  a surgery   Glaucoma    Glaucoma    both eyes   History of blood transfusion 1995   /w pelvic fx.    Hx of adenomatous polyp of colon 01/25/2015   Osteoarthritis of left hip 10/10/2014   Pelvic fracture (HCC) 09/28/1994   Pneumonia    in hosp[facility] ( /w pelvic fx)-    Past Surgical History:  Procedure Laterality Date   back cyst  12/29/2004   surgery x4 for cyst on the back    COLONOSCOPY  01/17/2015   Dr.Gessner   EYE SURGERY Bilateral 02/27/2015   Dr. Chalmers Guest, [facility]   FRACTURE SURGERY Right 12/29/1993   hip   HERNIA REPAIR  12/29/2004   umbilical     INGUINAL HERNIA REPAIR Right 05/13/2019   Procedure: RIGHT INGUINAL HERNIA REPAIR WITH MESH;  Surgeon: Abigail Miyamoto, MD;  Location: [facility] OR;  Service: General;  Laterality: Right;  AND TAP BLOCK   JOINT REPLACEMENT N/A    [employer]reesia 10/09/2020   PELVIC FRACTURE SURGERY  12/29/1993   TOTAL HIP ARTHROPLASTY Left 10/10/2014   dr Dion Saucier   TOTAL HIP ARTHROPLASTY Left 10/10/2014   Procedure: TOTAL HIP ARTHROPLASTY;  Surgeon: Eulas Post, MD;  Location: [facility] OR;  Service: Orthopedics;  Laterality: Left;   Family History  Problem Relation Age of Onset   Hypertension Mother    Diabetes Father    Hypertension Sister    Hypertension Sister    Cancer Maternal Grandmother    Colon cancer Neg Hx    Esophageal cancer Neg Hx    Rectal cancer Neg Hx    Stomach cancer Neg Hx    Social History   Socioeconomic History   Marital status: Single    Spouse name: Not on file   Number of children: 1   Years of education: Not on file   Highest education level: Some college, no degree  Occupational History   Occupation: distribution    Employer: [employer]  Tobacco Use   Smoking status: Never   Smokeless tobacco: Never  Vaping Use   Vaping status: Never Used  Substance and Sexual Activity   Alcohol use: Yes    Alcohol/week: 2.0 standard drinks of alcohol    Types: 2 Glasses of wine per week    Comment: occasional glass of wine   Drug use: No    Comment: hemp oil under tongue at night   Sexual activity: Yes  Other Topics Concern   Not on file  Social History Narrative   Not on file   Social Determinants of Health   Financial Resource Strain: Low Risk  (08/20/2023)   Overall Financial Resource Strain (CARDIA)    Difficulty of Paying Living Expenses: Not hard at all  Food Insecurity: No Food Insecurity (08/20/2023)   Hunger Vital Sign    Worried About Running Out of Food in the Last Year: Never true    Ran Out of Food in the Last Year: Never true  Transportation Needs: No  Transportation Needs (08/20/2023)   PRAPARE - Administrator, Civil Service (Medical): No    Lack of Transportation (Non-Medical): No  Physical Activity: Sufficiently Active (08/20/2023)   Exercise Vital Sign    Days of Exercise per Week: 5 days    Minutes of Exercise per Session: 50 min  Stress: No Stress Concern Present (08/20/2023)   [place]  Institute of Occupational Health - Occupational Stress Questionnaire    Feeling of Stress : Not at all  Social Connections: Moderately Integrated (08/20/2023)   Social Connection and Isolation Panel [NHANES]    Frequency of Communication with Friends and Family: Never    Frequency of Social Gatherings with Friends and Family: Three times a week    Attends Religious Services: 1 to 4 times per year    Active Member of Clubs or Organizations: Yes    Attends Engineer, structural: More than 4 times per year    Marital Status: Divorced    Tobacco Counseling Counseling given: Not Answered   Clinical Intake:  Pre-visit preparation completed: Yes  Pain : 0-10 Pain Score: 4  Pain Type: Chronic pain Pain Location: Knee (multiple causes of pain.  joint pain ,) Pain Descriptors / Indicators: Burning, Aching, Dull Pain Onset: More than a month ago Pain Frequency: Constant Pain Relieving Factors: holistic pain relievers  Pain Relieving Factors: holistic pain relievers  Diabetes: No  How often do you need to have someone help you when you read instructions, pamphlets, or other written materials from your doctor or pharmacy?: 1 - Never  Interpreter Needed?: No  Information entered by :: Remi Haggard LPN   Activities of Daily Living    08/20/2023    1:35 PM  In your present state of health, do you have any difficulty performing the following activities:  Hearing? 0  Vision? 0  Difficulty concentrating or making decisions? 0  Walking or climbing stairs? 0  Dressing or bathing? 0  Doing errands, shopping? 0  Preparing Food  and eating ? N  Using the Toilet? N  In the past six months, have you accidently leaked urine? N  Do you have problems with loss of bowel control? N  Managing your Medications? N  Managing your Finances? N  Housekeeping or managing your Housekeeping? N    Patient Care Team: Shade Flood, MD as PCP - General (Family Medicine)  Indicate any recent Medical Services you may have received from other than [facility] providers in the past year (date may be approximate).     Assessment:   This is a routine wellness examination for Burnet.  Hearing/Vision screen Hearing Screening - Comments:: No trouble hearing Vision Screening - Comments:: Up to date [facility]  Dietary issues and exercise activities discussed:     Goals Addressed             This Visit's Progress    Patient Stated   Not on track    Be better financially secure.      Patient Stated       Like to save some money        Depression Screen    08/20/2023    1:40 PM 05/13/2023   10:50 AM 08/14/2022    9:11 AM 08/14/2022    9:09 AM 01/30/2022   10:50 AM 05/22/2021    9:34 AM 02/20/2021    9:16 AM  PHQ 2/9 Scores  PHQ - 2 Score 0 0 0 0 0 0 0  PHQ- 9 Score 1 3    3      Fall Risk    08/20/2023    1:34 PM 08/20/2023    1:29 PM 05/13/2023   10:50 AM 08/14/2022    9:10 AM 01/30/2022   10:49 AM  Fall Risk   Falls in the past year? 0 0 0 0 0  Number falls in past yr: 0 0  0 0 0  Injury with Fall? 0 0 0 0 0  Risk for fall due to :     No Fall Risks  Follow up Education provided;Falls evaluation completed;Falls prevention discussed Falls evaluation completed;Education provided;Falls prevention discussed Falls evaluation completed Falls evaluation completed;Education provided Falls evaluation completed    MEDICARE RISK AT HOME:    TIMED UP AND GO:  Was the test performed?  No    Cognitive Function:        08/20/2023    1:36 PM 08/14/2022    9:11 AM 10/11/2020    9:10 AM 07/13/2019    9:37 AM  6CIT Screen   What Year? 0 points 0 points 0 points 0 points  What month? 0 points 0 points 0 points 0 points  What time? 0 points 0 points 0 points 0 points  Count back from 20 0 points 0 points 0 points 0 points  Months in reverse 0 points 0 points 0 points 0 points  Repeat phrase 0 points 0 points 0 points 0 points  Total Score 0 points 0 points 0 points 0 points    Immunizations Immunization History  Administered Date(s) Administered   Influenza Split 09/28/2012   Influenza, High Dose Seasonal PF 09/12/2018, 08/30/2020   Influenza, Seasonal, Injecte, Preservative Fre 09/23/2013   Influenza,inj,Quad PF,6+ Mos 09/08/2014, 09/20/2015   Influenza-Unspecified 08/31/2017, 09/26/2018, 09/08/2019, 10/19/2021   Moderna Sars-Covid-2 Vaccination 02/05/2020, 03/21/2020, 10/19/2020, 03/28/2021   PFIZER Comirnaty(Gray Top)Covid-19 Tri-Sucrose Vaccine 04/04/2023   Pfizer Covid-19 Vaccine Bivalent Booster 46yrs & up 09/12/2021   Pneumococcal Conjugate-13 09/20/2015   Pneumococcal Polysaccharide-23 09/23/2013, 11/01/2018   Tdap 09/23/2013   Zoster Recombinant(Shingrix) 08/31/2017   Zoster, Live 05/29/2010    TDAP status: Up to date  Flu Vaccine status: Up to date  Pneumococcal vaccine status: Up to date  Covid-19 vaccine status: Information provided on how to obtain vaccines.   Qualifies for Shingles Vaccine? Yes   Zostavax completed Yes   Shingrix Completed?: No.    Education has been provided regarding the importance of this vaccine. Patient has been advised to call insurance company to determine out of pocket expense if they have not yet received this vaccine. Advised may also receive vaccine at local pharmacy or Health Dept. Verbalized acceptance and understanding.  Screening Tests Health Maintenance  Topic Date Due   Zoster Vaccines- Shingrix (2 of 2) 10/26/2017   INFLUENZA VACCINE  07/30/2023   COVID-19 Vaccine (7 - 2023-24 season) 09/05/2023 (Originally 05/30/2023)   DTaP/Tdap/Td (2 - Td or  Tdap) 09/24/2023   Medicare Annual Wellness (AWV)  08/19/2024   Pneumonia Vaccine 39+ Years old  Completed   Hepatitis C Screening  Completed   HPV VACCINES  Aged Out   Colonoscopy  Discontinued    Health Maintenance  Health Maintenance Due  Topic Date Due   Zoster Vaccines- Shingrix (2 of 2) 10/26/2017   INFLUENZA VACCINE  07/30/2023    Colorectal cancer screening: No longer required.   Lung Cancer Screening: (Low Dose CT Chest recommended if Age 31-80 years, 20 pack-year currently smoking OR have quit w/in 15years.) does not qualify.   Lung Cancer Screening Referral:   Additional Screening:  Hepatitis C Screening: does not qualify; Completed 2016  Vision Screening: Recommended annual ophthalmology exams for early detection of glaucoma and other disorders of the eye. Is the patient up to date with their annual eye exam?  Yes  Who is the provider or what is the name of the office in which  the patient attends annual eye exams? [facility] If pt is not established with a provider, would they like to be referred to a provider to establish care? No .   Dental Screening: Recommended annual dental exams for proper oral hygiene    Community Resource Referral / Chronic Care Management: CRR required this visit?  No   CCM required this visit?  No     Plan:     I have personally reviewed and noted the following in the patient's chart:   Medical and social history Use of alcohol, tobacco or illicit drugs  Current medications and supplements including opioid prescriptions. Patient is not currently taking opioid prescriptions. Functional ability and status Nutritional status Physical activity Advanced directives List of other physicians Hospitalizations, surgeries, and ER visits in previous 12 months Vitals Screenings to include cognitive, depression, and falls Referrals and appointments  In addition, I have reviewed and discussed with patient certain preventive protocols,  quality metrics, and best practice recommendations. A written personalized care plan for preventive services as well as general preventive health recommendations were provided to patient.     Remi Haggard, LPN   1/61/0960   After Visit Summary: (MyChart) Due to this being a telephonic visit, the after visit summary with patients personalized plan was offered to patient via MyChart   Nurse Notes:

## 2023-08-20 NOTE — Patient Instructions (Signed)
Douglas Russell , Thank you for taking time to come for your Medicare Wellness Visit. I appreciate your ongoing commitment to your health goals. Please review the following plan we discussed and let me know if I can assist you in the future.   Screening recommendations/referrals: Colonoscopy: no longer required Recommended yearly ophthalmology/optometry visit for glaucoma screening and checkup Recommended yearly dental visit for hygiene and checkup  Vaccinations: Influenza vaccine: up to date Pneumococcal vaccine: up to date Tdap vaccine: up to date Shingles vaccine: 1 of 2    Advanced directives: Education provided      Preventive Care 74 Years and Older, Male Preventive care refers to lifestyle choices and visits with your health care provider that can promote health and wellness. What does preventive care include? A yearly physical exam. This is also called an annual well check. Dental exams once or twice a year. Routine eye exams. Ask your health care provider how often you should have your eyes checked. Personal lifestyle choices, including: Daily care of your teeth and gums. Regular physical activity. Eating a healthy diet. Avoiding tobacco and drug use. Limiting alcohol use. Practicing safe sex. Taking low doses of aspirin every day. Taking vitamin and mineral supplements as recommended by your health care provider. What happens during an annual well check? The services and screenings done by your health care provider during your annual well check will depend on your age, overall health, lifestyle risk factors, and family history of disease. Counseling  Your health care provider may ask you questions about your: Alcohol use. Tobacco use. Drug use. Emotional well-being. Home and relationship well-being. Sexual activity. Eating habits. History of falls. Memory and ability to understand (cognition). Work and work Astronomer. Screening  You may have the following  tests or measurements: Height, weight, and BMI. Blood pressure. Lipid and cholesterol levels. These may be checked every 5 years, or more frequently if you are over 46 years old. Skin check. Lung cancer screening. You may have this screening every year starting at age 74 if you have a 30-pack-year history of smoking and currently smoke or have quit within the past 15 years. Fecal occult blood test (FOBT) of the stool. You may have this test every year starting at age 74. Flexible sigmoidoscopy or colonoscopy. You may have a sigmoidoscopy every 5 years or a colonoscopy every 10 years starting at age 74. Prostate cancer screening. Recommendations will vary depending on your family history and other risks. Hepatitis C blood test. Hepatitis B blood test. Sexually transmitted disease (STD) testing. Diabetes screening. This is done by checking your blood sugar (glucose) after you have not eaten for a while (fasting). You may have this done every 1-3 years. Abdominal aortic aneurysm (AAA) screening. You may need this if you are a current or former smoker. Osteoporosis. You may be screened starting at age 74 if you are at high risk. Talk with your health care provider about your test results, treatment options, and if necessary, the need for more tests. Vaccines  Your health care provider may recommend certain vaccines, such as: Influenza vaccine. This is recommended every year. Tetanus, diphtheria, and acellular pertussis (Tdap, Td) vaccine. You may need a Td booster every 10 years. Zoster vaccine. You may need this after age 74. Pneumococcal 13-valent conjugate (PCV13) vaccine. One dose is recommended after age 74. Pneumococcal polysaccharide (PPSV23) vaccine. One dose is recommended after age 74. Talk to your health care provider about which screenings and vaccines you need and how often you  need them. This information is not intended to replace advice given to you by your health care provider.  Make sure you discuss any questions you have with your health care provider. Document Released: 01/11/2016 Document Revised: 09/03/2016 Document Reviewed: 10/16/2015 Elsevier Interactive Patient Education  2017 [employer].  Fall Prevention in the Home Falls can cause injuries. They can happen to people of all ages. There are many things you can do to make your home safe and to help prevent falls. What can I do on the outside of my home? Regularly fix the edges of walkways and driveways and fix any cracks. Remove anything that might make you trip as you walk through a door, such as a raised step or threshold. Trim any bushes or trees on the path to your home. Use bright outdoor lighting. Clear any walking paths of anything that might make someone trip, such as rocks or tools. Regularly check to see if handrails are loose or broken. Make sure that both sides of any steps have handrails. Any raised decks and porches should have guardrails on the edges. Have any leaves, snow, or ice cleared regularly. Use sand or salt on walking paths during winter. Clean up any spills in your garage right away. This includes oil or grease spills. What can I do in the bathroom? Use night lights. Install grab bars by the toilet and in the tub and shower. Do not use towel bars as grab bars. Use non-skid mats or decals in the tub or shower. If you need to sit down in the shower, use a plastic, non-slip stool. Keep the floor dry. Clean up any water that spills on the floor as soon as it happens. Remove soap buildup in the tub or shower regularly. Attach bath mats securely with double-sided non-slip rug tape. Do not have throw rugs and other things on the floor that can make you trip. What can I do in the bedroom? Use night lights. Make sure that you have a light by your bed that is easy to reach. Do not use any sheets or blankets that are too big for your bed. They should not hang down onto the floor. Have a  firm chair that has side arms. You can use this for support while you get dressed. Do not have throw rugs and other things on the floor that can make you trip. What can I do in the kitchen? Clean up any spills right away. Avoid walking on wet floors. Keep items that you use a lot in easy-to-reach places. If you need to reach something above you, use a strong step stool that has a grab bar. Keep electrical cords out of the way. Do not use floor polish or wax that makes floors slippery. If you must use wax, use non-skid floor wax. Do not have throw rugs and other things on the floor that can make you trip. What can I do with my stairs? Do not leave any items on the stairs. Make sure that there are handrails on both sides of the stairs and use them. Fix handrails that are broken or loose. Make sure that handrails are as long as the stairways. Check any carpeting to make sure that it is firmly attached to the stairs. Fix any carpet that is loose or worn. Avoid having throw rugs at the top or bottom of the stairs. If you do have throw rugs, attach them to the floor with carpet tape. Make sure that you have a light switch  at the top of the stairs and the bottom of the stairs. If you do not have them, ask someone to add them for you. What else can I do to help prevent falls? Wear shoes that: Do not have high heels. Have rubber bottoms. Are comfortable and fit you well. Are closed at the toe. Do not wear sandals. If you use a stepladder: Make sure that it is fully opened. Do not climb a closed stepladder. Make sure that both sides of the stepladder are locked into place. Ask someone to hold it for you, if possible. Clearly mark and make sure that you can see: Any grab bars or handrails. First and last steps. Where the edge of each step is. Use tools that help you move around (mobility aids) if they are needed. These include: Canes. Walkers. Scooters. Crutches. Turn on the lights when you  go into a dark area. Replace any light bulbs as soon as they burn out. Set up your furniture so you have a clear path. Avoid moving your furniture around. If any of your floors are uneven, fix them. If there are any pets around you, be aware of where they are. Review your medicines with your doctor. Some medicines can make you feel dizzy. This can increase your chance of falling. Ask your doctor what other things that you can do to help prevent falls. This information is not intended to replace advice given to you by your health care provider. Make sure you discuss any questions you have with your health care provider. Document Released: 10/11/2009 Document Revised: 05/22/2016 Document Reviewed: 01/19/2015 Elsevier Interactive Patient Education  2017 [employer].

## 2023-09-02 DIAGNOSIS — H2513 Age-related nuclear cataract, bilateral: Secondary | ICD-10-CM | POA: Diagnosis not present

## 2023-09-02 DIAGNOSIS — H04123 Dry eye syndrome of bilateral lacrimal glands: Secondary | ICD-10-CM | POA: Diagnosis not present

## 2023-09-02 DIAGNOSIS — H401132 Primary open-angle glaucoma, bilateral, moderate stage: Secondary | ICD-10-CM | POA: Diagnosis not present

## 2023-11-11 ENCOUNTER — Ambulatory Visit: Payer: Medicare HMO | Admitting: Family Medicine

## 2023-11-11 VITALS — BP 126/66 | HR 68 | Temp 97.8°F | Ht 71.0 in | Wt 187.0 lb

## 2023-11-11 DIAGNOSIS — R9431 Abnormal electrocardiogram [ECG] [EKG]: Secondary | ICD-10-CM | POA: Diagnosis not present

## 2023-11-11 DIAGNOSIS — R200 Anesthesia of skin: Secondary | ICD-10-CM

## 2023-11-11 DIAGNOSIS — E785 Hyperlipidemia, unspecified: Secondary | ICD-10-CM | POA: Diagnosis not present

## 2023-11-11 DIAGNOSIS — J4520 Mild intermittent asthma, uncomplicated: Secondary | ICD-10-CM

## 2023-11-11 DIAGNOSIS — R351 Nocturia: Secondary | ICD-10-CM | POA: Diagnosis not present

## 2023-11-11 DIAGNOSIS — R202 Paresthesia of skin: Secondary | ICD-10-CM | POA: Diagnosis not present

## 2023-11-11 DIAGNOSIS — J309 Allergic rhinitis, unspecified: Secondary | ICD-10-CM | POA: Diagnosis not present

## 2023-11-11 DIAGNOSIS — R0789 Other chest pain: Secondary | ICD-10-CM

## 2023-11-11 LAB — LIPID PANEL
Cholesterol: 214 mg/dL — ABNORMAL HIGH (ref 0–200)
HDL: 79.5 mg/dL (ref >39.00)
LDL Cholesterol: 120 mg/dL — ABNORMAL HIGH (ref 0–99)
NonHDL: 134.56
Total CHOL/HDL Ratio: 3
Triglycerides: 71 mg/dL (ref 0.0–149.0)
VLDL: 14.2 mg/dL (ref 0.0–40.0)

## 2023-11-11 LAB — CBC
HCT: 45.7 % (ref 39.0–52.0)
Hemoglobin: 14.9 g/dL (ref 13.0–17.0)
MCHC: 32.5 g/dL (ref 30.0–36.0)
MCV: 90.4 fL (ref 78.0–100.0)
Platelets: 184 10*3/uL (ref 150.0–400.0)
RBC: 5.05 Mil/uL (ref 4.22–5.81)
RDW: 15.9 % — ABNORMAL HIGH (ref 11.5–15.5)
WBC: 4.9 10*3/uL (ref 4.0–10.5)

## 2023-11-11 LAB — COMPREHENSIVE METABOLIC PANEL
ALT: 26 U/L (ref 0–53)
AST: 28 U/L (ref 0–37)
Albumin: 4 g/dL (ref 3.5–5.2)
Alkaline Phosphatase: 79 U/L (ref 39–117)
BUN: 17 mg/dL (ref 6–23)
CO2: 31 meq/L (ref 19–32)
Calcium: 9.5 mg/dL (ref 8.4–10.5)
Chloride: 101 meq/L (ref 96–112)
Creatinine, Ser: 1.04 mg/dL (ref 0.40–1.50)
GFR: 70.58 mL/min (ref >60.00)
Glucose, Bld: 83 mg/dL (ref 70–99)
Potassium: 4.4 meq/L (ref 3.5–5.1)
Sodium: 138 meq/L (ref 135–145)
Total Bilirubin: 0.8 mg/dL (ref 0.2–1.2)
Total Protein: 7.4 g/dL (ref 6.0–8.3)

## 2023-11-11 LAB — TSH: TSH: 0.96 u[IU]/mL (ref 0.35–5.50)

## 2023-11-11 NOTE — Patient Instructions (Signed)
No change in asthma medications for now or allergy med.  Continue tamsulosin for urinary symptoms.  If any worsening symptoms would recommend discussion of other medications with urology.  Right hand symptoms could be carpal tunnel syndrome or mild symptoms of carpal tunnel.  See information below.  Over-the-counter wrist brace temporarily is reasonable and recheck in the next 4 to 6 weeks.  Sooner if any new or worsening symptoms.  Chest wall symptoms appear to be due to soreness in your pectoralis or chest muscle, not cardiac but there were a few changes in your EKG compared to your previous 1.  Those do not look concerning but I would like you to meet with cardiology to make sure no other testing is needed.  If any new symptoms be seen right away but I do not expect that to occur.  Take care.    Carpal Tunnel Syndrome  Carpal tunnel syndrome is a condition that causes pain, weakness, and numbness in your hand and arm. Numbness is when you cannot feel an area in your body. The carpal tunnel is a narrow area that is on the palm side of your wrist. Repeated wrist motion or certain diseases may cause swelling in the tunnel. This swelling can pinch the main nerve in the wrist. This nerve is called the median nerve. What are the causes? This condition may be caused by: Moving your hand and wrist over and over again while doing a task. Injury to the wrist. Arthritis. A sac of fluid (cyst) or abnormal growth (tumor) in the carpal tunnel. Fluid buildup during pregnancy. Use of tools that vibrate. Sometimes the cause is not known. What increases the risk? The following factors may make you more likely to have this condition: Having a job that makes you do these things: Move your hand over and over again. Work with tools that vibrate, such as drills or sanders. Being a woman. Having diabetes, obesity, thyroid problems, or kidney failure. What are the signs or symptoms? Symptoms of this condition  include: A tingling feeling in your fingers. Tingling or loss of feeling in your hand. Pain in your entire arm. This pain may get worse when you bend your wrist and elbow for a long time. Pain in your wrist that goes up your arm to your shoulder. Pain that goes down into your palm or fingers. Weakness in your hands. You may find it hard to grab and hold items. You may feel worse at night. How is this treated? This condition may be treated with: Lifestyle changes. You will be asked to stop or change the activity that caused your problem. Doing exercises and activities that make bones, muscles, and tendons stronger (physical therapy). Learning how to use your hand again (occupational therapy). Medicines for pain and swelling. You may have injections in your wrist. A wrist splint or brace. Surgery. Follow these instructions at home: If you have a splint or brace: Wear the splint or brace as told by your doctor. Take it off only as told by your doctor. Loosen the splint if your fingers: Tingle. Become numb. Turn cold and blue. Keep the splint or brace clean. If the splint or brace is not waterproof: Do not let it get wet. Cover it with a watertight covering when you take a bath or a shower. Managing pain, stiffness, and swelling If told, put ice on the painful area: If you have a removable splint or brace, remove it as told by your doctor. Put ice in a  plastic bag. Place a towel between your skin and the bag. Leave the ice on for 20 minutes, 2-3 times per day. Do not fall asleep with the cold pack on your skin. Take off the ice if your skin turns bright red. This is very important. If you cannot feel pain, heat, or cold, you have a greater risk of damage to the area. Move your fingers often to reduce stiffness and swelling. General instructions Take over-the-counter and prescription medicines only as told by your doctor. Rest your wrist from any activity that may cause pain. If  needed, talk with your boss at work about changes that can help your wrist heal. Do exercises as told by your doctor, physical therapist, or occupational therapist. Keep all follow-up visits. Contact a doctor if: You have new symptoms. Medicine does not help your pain. Your symptoms get worse. Get help right away if: You have very bad numbness or tingling in your wrist or hand. Summary Carpal tunnel syndrome is a condition that causes pain in your hand and arm. It is often caused by repeated wrist motions. Lifestyle changes and medicines are used to treat this problem. Surgery may help in very bad cases. Follow your doctor's instructions about wearing a splint, resting your wrist, keeping follow-up visits, and calling for help. This information is not intended to replace advice given to you by your health care provider. Make sure you discuss any questions you have with your health care provider. Document Revised: 04/26/2020 Document Reviewed: 04/26/2020 Elsevier Patient Education  2024 [employer].

## 2023-11-11 NOTE — Progress Notes (Signed)
Subjective:  Patient ID: Douglas Russell, male    DOB: 1951/07/24  Age: 72 y.o. MRN: [id number]  CC:  Chief Complaint  Patient presents with   Medical Management of Chronic Issues    Pt is doing well, no concerns     HPI Douglas Russell presents for follow up.   R hand tingling: Past week or two. R hand only, comes and goes. No weakness. 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 No recent changes in activity. Fingertips only - 2nd through 4th-5th.  No hand weakness, no radicular arm sx's or neck pain. No specific activity makes it worse or better. No pain.   Left chest discomfort: Notes 1-2x/week. Left chest wall/muscle with twisting/turning. No neck/arm radiation. No associated n/v/diaphoresis. No chest heaviness or pressure. No dyspnea. Not painful, jsut sensation with movement. No bruising/swelling.   Mild intermittent asthma Discussed in May.  Had been using Advair 250/50 that had been working okay.  Works in a Social research officer, government.  Was having to use albuterol more frequently when he was out of Advair for the previous week.  On cetirizine 10 mg daily for allergic rhinitis, Flonase only  as needed.  Controlled on meds, flare off meds.  Back on advair BID, working well. Albuterol - not needed recently. No new side effects.   BPH with LUTS Followed by urology.  Treated with Flomax, fluid avoidance before bedtime.  Nocturia is primary symptom, discussed with urology, Dr. Cardell Peach earlier this year.  Was continued on Flomax, option of Myrbetriq.  Flomax has been effective. Nocturia 2x/night - stable.  No change in urination. Would like to stay on flomax. Cuts fluids at 8pm.   Hyperlipidemia: No current meds or recent labs.  Lab Results  Component Value Date   CHOL 207 (H) 02/20/2021   HDL 77 02/20/2021   LDLCALC 121 (H) 02/20/2021   LDLDIRECT 113 (H) 05/11/2015   TRIG 50 02/20/2021   CHOLHDL 2.7 02/20/2021   Lab Results  Component Value Date   ALT 27  01/30/2022   AST 33 01/30/2022   ALKPHOS 69 01/30/2022   BILITOT 1.0 01/30/2022      History Patient Active Problem List   Diagnosis Date Noted   Hx of adenomatous polyp of colon 01/25/2015   Status post left hip replacement 10/13/2014   Asthma, chronic 10/13/2014   Osteoarthritis of left hip 10/10/2014   Hip arthritis 10/10/2014   History of asthma 08/08/2014   History of umbilical hernia 08/08/2014   History of pelvic fracture 08/08/2014   Past Medical History:  Diagnosis Date   Arthritis    L&R hip & pelvis    Asthma    has had since he was a child, states he only uses the inhaler PRN   Asthma    Phreesia 10/09/2020   Cataract    Family history of adverse reaction to anesthesia    Pt. not exactly sure-sister"they almost lost her"during  a surgery   Glaucoma    Glaucoma    both eyes   History of blood transfusion 1995   /w pelvic fx.    Hx of adenomatous polyp of colon 01/25/2015   Osteoarthritis of left hip 10/10/2014   Pelvic fracture (HCC) 09/28/1994   Pneumonia    in hosp[facility] ( /w pelvic fx)-    Past Surgical History:  Procedure Laterality Date   back cyst  12/29/2004   surgery x4 for cyst on the back    COLONOSCOPY  01/17/2015   Dr.Gessner   EYE SURGERY Bilateral 02/27/2015   Dr. Chalmers Guest, [facility]   FRACTURE SURGERY Right 12/29/1993   hip   HERNIA REPAIR  12/29/2004   umbilical    INGUINAL HERNIA REPAIR Right 05/13/2019   Procedure: RIGHT INGUINAL HERNIA REPAIR WITH MESH;  Surgeon: Abigail Miyamoto, MD;  Location: [facility] OR;  Service: General;  Laterality: Right;  AND TAP BLOCK   JOINT REPLACEMENT N/A    Phreesia 10/09/2020   PELVIC FRACTURE SURGERY  12/29/1993   TOTAL HIP ARTHROPLASTY Left 10/10/2014   dr Dion Saucier   TOTAL HIP ARTHROPLASTY Left 10/10/2014   Procedure: TOTAL HIP ARTHROPLASTY;  Surgeon: Eulas Post, MD;  Location: [facility] OR;  Service: Orthopedics;  Laterality: Left;   Allergies  Allergen Reactions   Other Hives and Shortness Of  Breath   Shellfish Allergy Anaphylaxis and Swelling    Throat swells closed," like there is a block of cement in my throat"    Trazodone And Nefazodone     Jittery / shaking    Prior to Admission medications   Medication Sig Start Date End Date Taking? Authorizing Provider  albuterol (VENTOLIN HFA) 108 (90 Base) MCG/ACT inhaler Inhale 1-2 puffs into the lungs every 6 (six) hours as needed for wheezing or shortness of breath. Ok to fill with preferred pro air 02/20/21  Yes Shade Flood, MD  bimatoprost (LUMIGAN) 0.01 % SOLN Place 1 drop into both eyes at bedtime.    Yes [provider]  cetirizine (EQ ALLERGY RELIEF, CETIRIZINE,) 10 MG tablet Take 1 tablet (10 mg total) by mouth daily. 05/13/23  Yes Shade Flood, MD  CINNAMON PO Take 1,000 mg by mouth daily.    Yes [provider]  fluticasone (FLONASE) 50 MCG/ACT nasal spray Place 1-2 sprays into both nostrils daily. 02/12/22  Yes Shade Flood, MD  fluticasone-salmeterol (ADVAIR) 250-50 MCG/ACT AEPB Inhale 1 puff into the lungs in the morning and at bedtime. 05/13/23  Yes Shade Flood, MD  Garlic 1000 MG CAPS Take 1,000 mg by mouth daily.    Yes [provider]  Glucosamine-Chondroit-Vit C-Mn (GLUCOSAMINE 1500 COMPLEX PO) Take by mouth.   Yes [provider]  MAGNESIUM PO Take by mouth.   Yes [provider]  Melatonin 1 MG TABS Take 1 mg by mouth at bedtime as needed (for sleep).    Yes [provider]  Multiple Vitamins-Minerals (CENTRUM SILVER 50+MEN PO) Take by mouth.   Yes [provider]  Omega 3-6-9 Fatty Acids (OMEGA-3-6-9 PO) Take 2 capsules by mouth daily.   Yes [provider]  OVER THE COUNTER MEDICATION Place 1 Dose under the tongue 2 (two) times a day. CBD oil   Yes [provider]  POTASSIUM PO Take by mouth.   Yes [provider]  RHOPRESSA 0.02 % SOLN  11/22/19  Yes [provider]  tamsulosin (FLOMAX) 0.4 MG CAPS  capsule Take 1 capsule (0.4 mg total) by mouth at bedtime. 05/13/23  Yes Shade Flood, MD  Turmeric Curcumin 500 MG CAPS Take 2 capsules by mouth daily.    Yes [provider]   Social History   Socioeconomic History   Marital status: Single    Spouse name: Not on file   Number of children: 1   Years of education: Not on file   Highest education level: Some college, no degree  Occupational History   Occupation: distribution    Employer: [employer]  Tobacco Use   Smoking status: Never   Smokeless tobacco: Never  Vaping Use   Vaping status: Never Used  Substance and Sexual Activity   Alcohol use: Yes    Alcohol/week: 2.0 standard drinks of alcohol    Types: 2 Glasses of wine per week    Comment: occasional glass of wine   Drug use: No    Comment: hemp oil under tongue at night   Sexual activity: Yes  Other Topics Concern   Not on file  Social History Narrative   Not on file   Social Determinants of Health   Financial Resource Strain: Low Risk  (11/11/2023)   Overall Financial Resource Strain (CARDIA)    Difficulty of Paying Living Expenses: Not hard at all  Food Insecurity: No Food Insecurity (11/11/2023)   Hunger Vital Sign    Worried About Running Out of Food in the Last Year: Never true    Ran Out of Food in the Last Year: Never true  Transportation Needs: No Transportation Needs (11/11/2023)   PRAPARE - Administrator, Civil Service (Medical): No    Lack of Transportation (Non-Medical): No  Physical Activity: Sufficiently Active (11/11/2023)   Exercise Vital Sign    Days of Exercise per Week: 5 days    Minutes of Exercise per Session: 150+ min  Stress: No Stress Concern Present (11/11/2023)   [employer] of Occupational Health - Occupational Stress Questionnaire    Feeling of Stress : Not at all  Social Connections: Moderately Integrated (11/11/2023)   Social Connection and Isolation Panel [NHANES]    Frequency of  Communication with Friends and Family: More than three times a week    Frequency of Social Gatherings with Friends and Family: Three times a week    Attends Religious Services: More than 4 times per year    Active Member of Clubs or Organizations: Yes    Attends Banker Meetings: More than 4 times per year    Marital Status: Divorced  Intimate Partner Violence: Not At Risk (08/20/2023)   Humiliation, Afraid, Rape, and Kick questionnaire    Fear of Current or Ex-Partner: No    Emotionally Abused: No    Physically Abused: No    Sexually Abused: No    Review of Systems  Constitutional:  Negative for fatigue and unexpected weight change.  Eyes:  Negative for visual disturbance.  Respiratory:  Negative for cough, chest tightness and shortness of breath.   Cardiovascular:  Negative for chest pain (Chest wall pain as above.), palpitations and leg swelling.  Gastrointestinal:  Negative for abdominal pain and blood in stool.  Neurological:  Negative for dizziness, light-headedness and headaches.     Objective:   Vitals:   11/11/23 1025  BP: 126/66  Pulse: 68  Temp: 97.8 F (36.6 C)  TempSrc: Temporal  SpO2: 98%  Weight: 187 lb (84.8 kg)  Height: 5\' 11"  (1.803 m)     Physical Exam Vitals reviewed.  Constitutional:      Appearance: He is well-developed.  HENT:     Head: Normocephalic and atraumatic.  Neck:     Vascular: No carotid bruit or JVD.  Cardiovascular:     Rate and Rhythm: Normal rate and regular rhythm.     Heart sounds: Normal heart sounds. No murmur heard. Pulmonary:     Effort: Pulmonary effort is normal.     Breath sounds: Normal breath sounds. No rales.  Musculoskeletal:     Right lower leg: No  edema.     Left lower leg: No edema.     Comments: Right hand, nontender, cap refill less than 1 second.  Describes area of paresthesias in the fingertips, 2nd through 4th primarily.  Slight increased dysesthesias into fingertips with Phalen's, minimal  change with Tinel's.  Skin:    General: Skin is warm and dry.  Neurological:     Mental Status: He is alert and oriented to person, place, and time.  Psychiatric:        Mood and Affect: Mood normal.      EKG, sinus rhythm with first-degree AV block with PR interval 220.  Left anterior fascicular block.Compared to 08/09/2014, new first-degree AV block but no other apparent acute findings.  Assessment & Plan:  Douglas Russell is a 72 y.o. male . Mild intermittent asthma without complication - Plan: CBC  -Continue, continue current regimen.  Allergic rhinitis, unspecified seasonality, unspecified trigger  -Trigger avoidance, overall stable with above regimen.  RTC precautions.  Nocturia  -Stable tamsulosin without any side effects, continue same  Chest wall pain - Plan: EKG 12-Lead, Ambulatory referral to Cardiology  -Chest symptoms appear to be more chest wall, symptomatic care discussed with RTC precautions.  EKG as above with some nonacute changes, will have follow-up with cardiology, ER precautions given if new or worsening chest pain or associated symptoms.  Numbness and tingling in right hand - Plan: CBC, TSH  -Possible mild carpal tunnel syndrome.  Check CBC, TSH, over-the-counter wrist brace and handout given with RTC precautions if more persistent symptoms or worsening.  Consider hand/Ortho eval.  Abnormal EKG - Plan: Ambulatory referral to Cardiology  -Few slight abnormalities as above, referred to cardiology to decide on further testing.  Hyperlipidemia, unspecified hyperlipidemia type - Plan: Comprehensive metabolic panel, Lipid panel  -Check labs and adjust plan accordingly including discussion of meds if needed.  No orders of the defined types were placed in this encounter.  Patient Instructions  No change in asthma medications for now or allergy med.  Continue tamsulosin for urinary symptoms.  If any worsening symptoms would recommend discussion of other  medications with urology.  Right hand symptoms could be carpal tunnel syndrome or mild symptoms of carpal tunnel.  See information below.  Over-the-counter wrist brace temporarily is reasonable and recheck in the next 4 to 6 weeks.  Sooner if any new or worsening symptoms.  Chest wall symptoms appear to be due to soreness in your pectoralis or chest muscle, not cardiac but there were a few changes in your EKG compared to your previous 1.  Those do not look concerning but I would like you to meet with cardiology to make sure no other testing is needed.  If any new symptoms be seen right away but I do not expect that to occur.  Take care.    Carpal Tunnel Syndrome  Carpal tunnel syndrome is a condition that causes pain, weakness, and numbness in your hand and arm. Numbness is when you cannot feel an area in your body. The carpal tunnel is a narrow area that is on the palm side of your wrist. Repeated wrist motion or certain diseases may cause swelling in the tunnel. This swelling can pinch the main nerve in the wrist. This nerve is called the median nerve. What are the causes? This condition may be caused by: Moving your hand and wrist over and over again while doing a task. Injury to the wrist. Arthritis. A sac of fluid (cyst) or abnormal  growth (tumor) in the carpal tunnel. Fluid buildup during pregnancy. Use of tools that vibrate. Sometimes the cause is not known. What increases the risk? The following factors may make you more likely to have this condition: Having a job that makes you do these things: Move your hand over and over again. Work with tools that vibrate, such as drills or sanders. Being a woman. Having diabetes, obesity, thyroid problems, or kidney failure. What are the signs or symptoms? Symptoms of this condition include: A tingling feeling in your fingers. Tingling or loss of feeling in your hand. Pain in your entire arm. This pain may get worse when you bend your wrist  and elbow for a long time. Pain in your wrist that goes up your arm to your shoulder. Pain that goes down into your palm or fingers. Weakness in your hands. You may find it hard to grab and hold items. You may feel worse at night. How is this treated? This condition may be treated with: Lifestyle changes. You will be asked to stop or change the activity that caused your problem. Doing exercises and activities that make bones, muscles, and tendons stronger (physical therapy). Learning how to use your hand again (occupational therapy). Medicines for pain and swelling. You may have injections in your wrist. A wrist splint or brace. Surgery. Follow these instructions at home: If you have a splint or brace: Wear the splint or brace as told by your doctor. Take it off only as told by your doctor. Loosen the splint if your fingers: Tingle. Become numb. Turn cold and blue. Keep the splint or brace clean. If the splint or brace is not waterproof: Do not let it get wet. Cover it with a watertight covering when you take a bath or a shower. Managing pain, stiffness, and swelling If told, put ice on the painful area: If you have a removable splint or brace, remove it as told by your doctor. Put ice in a plastic bag. Place a towel between your skin and the bag. Leave the ice on for 20 minutes, 2-3 times per day. Do not fall asleep with the cold pack on your skin. Take off the ice if your skin turns bright red. This is very important. If you cannot feel pain, heat, or cold, you have a greater risk of damage to the area. Move your fingers often to reduce stiffness and swelling. General instructions Take over-the-counter and prescription medicines only as told by your doctor. Rest your wrist from any activity that may cause pain. If needed, talk with your boss at work about changes that can help your wrist heal. Do exercises as told by your doctor, physical therapist, or occupational  therapist. Keep all follow-up visits. Contact a doctor if: You have new symptoms. Medicine does not help your pain. Your symptoms get worse. Get help right away if: You have very bad numbness or tingling in your wrist or hand. Summary Carpal tunnel syndrome is a condition that causes pain in your hand and arm. It is often caused by repeated wrist motions. Lifestyle changes and medicines are used to treat this problem. Surgery may help in very bad cases. Follow your doctor's instructions about wearing a splint, resting your wrist, keeping follow-up visits, and calling for help. This information is not intended to replace advice given to you by your health care provider. Make sure you discuss any questions you have with your health care provider. Document Revised: 04/26/2020 Document Reviewed: 04/26/2020 Elsevier Patient Education  2024 [employer]     Signed,   Meredith Staggers, MD [facility], [facility] 11/11/23 11:08 AM

## 2023-11-14 ENCOUNTER — Encounter: Payer: Self-pay | Admitting: Family Medicine

## 2023-12-31 DIAGNOSIS — H401132 Primary open-angle glaucoma, bilateral, moderate stage: Secondary | ICD-10-CM | POA: Diagnosis not present

## 2023-12-31 DIAGNOSIS — H04123 Dry eye syndrome of bilateral lacrimal glands: Secondary | ICD-10-CM | POA: Diagnosis not present

## 2023-12-31 DIAGNOSIS — H2513 Age-related nuclear cataract, bilateral: Secondary | ICD-10-CM | POA: Diagnosis not present

## 2024-01-19 ENCOUNTER — Emergency Department (HOSPITAL_COMMUNITY): Payer: Medicare HMO

## 2024-01-19 ENCOUNTER — Other Ambulatory Visit: Payer: Self-pay

## 2024-01-19 ENCOUNTER — Emergency Department (HOSPITAL_COMMUNITY)
Admission: EM | Admit: 2024-01-19 | Discharge: 2024-01-20 | Disposition: A | Discharge status: Home / Self Care | Payer: Medicare HMO | Attending: Emergency Medicine | Admitting: Emergency Medicine

## 2024-01-19 ENCOUNTER — Encounter (HOSPITAL_COMMUNITY): Payer: Self-pay | Admitting: Emergency Medicine

## 2024-01-19 DIAGNOSIS — R079 Chest pain, unspecified: Secondary | ICD-10-CM | POA: Insufficient documentation

## 2024-01-19 DIAGNOSIS — R202 Paresthesia of skin: Secondary | ICD-10-CM | POA: Diagnosis not present

## 2024-01-19 DIAGNOSIS — Z20822 Contact with and (suspected) exposure to covid-19: Secondary | ICD-10-CM | POA: Diagnosis not present

## 2024-01-19 DIAGNOSIS — R42 Dizziness and giddiness: Secondary | ICD-10-CM | POA: Insufficient documentation

## 2024-01-19 DIAGNOSIS — R002 Palpitations: Secondary | ICD-10-CM | POA: Insufficient documentation

## 2024-01-19 LAB — BASIC METABOLIC PANEL
Anion gap: 9 (ref 5–15)
BUN: 17 mg/dL (ref 8–23)
CO2: 23 mmol/L (ref 22–32)
Calcium: 9.1 mg/dL (ref 8.9–10.3)
Chloride: 104 mmol/L (ref 98–111)
Creatinine, Ser: 0.87 mg/dL (ref 0.61–1.24)
GFR, Estimated: >60 mL/min (ref >60)
Glucose, Bld: 123 mg/dL — ABNORMAL HIGH (ref 70–99)
Potassium: 4 mmol/L (ref 3.5–5.1)
Sodium: 136 mmol/L (ref 135–145)

## 2024-01-19 LAB — CBC
HCT: 40.9 % (ref 39.0–52.0)
Hemoglobin: 13.9 g/dL (ref 13.0–17.0)
MCH: 29.4 pg (ref 26.0–34.0)
MCHC: 34 g/dL (ref 30.0–36.0)
MCV: 86.7 fL (ref 80.0–100.0)
Platelets: 248 10*3/uL (ref 150–400)
RBC: 4.72 MIL/uL (ref 4.22–5.81)
RDW: 14.4 % (ref 11.5–15.5)
WBC: 5.2 10*3/uL (ref 4.0–10.5)
nRBC: 0 % (ref 0.0–0.2)

## 2024-01-19 LAB — RESP PANEL BY RT-PCR (RSV, FLU A&B, COVID)  RVPGX2
Influenza A by PCR: NEGATIVE
Influenza B by PCR: NEGATIVE
Resp Syncytial Virus by PCR: NEGATIVE
SARS Coronavirus 2 by RT PCR: NEGATIVE

## 2024-01-19 LAB — TROPONIN I (HIGH SENSITIVITY)
Troponin I (High Sensitivity): 5 ng/L (ref <18)
Troponin I (High Sensitivity): 6 ng/L (ref <18)

## 2024-01-19 NOTE — ED Provider Triage Note (Signed)
Emergency Medicine Provider Triage Evaluation Note  Douglas Russell , a 73 y.o. male  was evaluated in triage.  Pt complains of presented with dizziness along with left-sided chest pain for the 10th Sunday.  Patient states he is discomfort in his sinuses but denies any fevers.  Patient is he can reproduce his chest pain with palpation.  Patient denies fevers, shortness of breath, abd pain nausea vomiting, left arm pain.  Patient is not on any blood thinners.  Patient does not have history of strokes.  Review of Systems  Positive:  Negative:   Physical Exam  BP (!) 153/90 (BP Location: Right Arm)   Pulse 75   Temp 98.5 F (36.9 C)   Resp 18   Ht 6' (1.829 m)   Wt 83.9 kg   SpO2 97%   BMI 25.09 kg/m  Gen:   Awake, no distress   Resp:  Normal effort  MSK:   Moves extremities without difficulty  Other:  Pupils PERRL, no nystagmus noted, able to reproduce patient's chest pain with palpation, equal smile, reassuring finger-to-nose  Medical Decision Making  Medically screening exam initiated at 5:53 PM.  Appropriate orders placed.  [name] was informed that the remainder of the evaluation will be completed by another provider, this initial triage assessment does not replace that evaluation, and the importance of remaining in the ED until their evaluation is complete.  Workup initiated, do suspect this is viral sinusitis plan with MSK chest pain, patient stable at this time.   Netta Corrigan, PA-C 01/19/24 1756

## 2024-01-19 NOTE — ED Triage Notes (Signed)
Pt here for BIL facial numbness, dizziness, and palpitations when he leans forward since Sunday. Denies changes in vision. Reports a discomfort when he presses on the L side of his chest.

## 2024-01-20 NOTE — Discharge Instructions (Signed)
Please return for new or worsening symptoms.  Otherwise, please follow-up with your cardiologist and your PCP.

## 2024-01-20 NOTE — ED Provider Notes (Signed)
[facility]PT [facility] Emergency Department Provider Note MRN:  [id number]  Arrival date & time: 01/20/24     Chief Complaint   Palpitations   History of Present Illness   Douglas Russell is a 73 y.o. year-old male presents to the ED with chief complaint of palpitations, intermittent dizziness, and facial tingling on both sides of his face.  He states that he had some chest pain earlier that was reproducible with palpation, but states that this has resolved.  He denies fever, chills, cough, n/v/d.  Denies any anticoagulants.  Denies any prior strokes.  History provided by patient.   Review of Systems  Pertinent positive and negative review of systems noted in HPI.    Physical Exam   Vitals:   01/20/24 0145 01/20/24 0300  BP: 138/82 138/89  Pulse: 68 71  Resp: 18 19  Temp: 97.8 F (36.6 C)   SpO2: 100% 95%    CONSTITUTIONAL:  well-appearing, NAD NEURO:  Alert and oriented x 3, CN 3-12 grossly intact, normal finger to nose, no pronator drift EYES:  eyes equal and reactive ENT/NECK:  Supple, no stridor  CARDIO:  normal rate, regular rhythm, appears well-perfused  PULM:  No respiratory distress, CTAB GI/GU:  non-distended,  MSK/SPINE:  No gross deformities, no edema, moves all extremities  SKIN:  no rash, atraumatic   *Additional and/or pertinent findings included in MDM below  Diagnostic and Interventional Summary    EKG Interpretation Date/Time:    Ventricular Rate:    PR Interval:    QRS Duration:    QT Interval:    QTC Calculation:   R Axis:      Text Interpretation:         Labs Reviewed  BASIC METABOLIC PANEL - Abnormal; Notable for the following components:      Result Value   Glucose, Bld 123 (*)    All other components within normal limits  RESP PANEL BY RT-PCR (RSV, FLU A&B, COVID)  RVPGX2  CBC  TROPONIN I (HIGH SENSITIVITY)  TROPONIN I (HIGH SENSITIVITY)    CT Head Wo Contrast  Final Result    DG Chest 1 View  Final  Result      Medications - No data to display   Procedures  /  Critical Care Procedures  ED Course and Medical Decision Making  I have reviewed the triage vital signs, the nursing notes, and pertinent available records from the EMR.  Social Determinants Affecting Complexity of Care: Patient has no clinically significant social determinants affecting this chief complaint..   ED Course: Clinical Course as of 01/20/24 0337  Wed Jan 20, 2024  0336 DG Chest 1 View No opacity or effusion [RB]  0336 CT Head Wo Contrast No ICH [RB]  0336 Basic metabolic panel(!) No significant electrolyte abnormality [RB]  0337 CBC No anemia or leukocytosis [RB]  0337 Troponin I (High Sensitivity) Trop is negative [RB]    Clinical Course User Index [RB] Roxy Horseman, PA-C    Medical Decision Making Patient here with palpitations, intermittent dizziness and facial tingling.  The facial tingling is bilateral.  Could be some anxiety driven, but uncertain eitiology.  Symptoms have resolved now.    His labs and imaging studies are all very reassuring.  Normal trops.  Doubt ACS.    CT head ordered in triage is negative.  Not hypotensive.  Normal HGB.  Will have patient follow-up with cardiology.  Amount and/or Complexity of Data Reviewed Labs:  Decision-making details documented in  ED Course. Radiology: independent interpretation performed. Decision-making details documented in ED Course. ECG/medicine tests: independent interpretation performed.    Details: Sinus arrhythmia, 1st degree av block         Consultants: No consultations were needed in caring for this patient.   Treatment and Plan: I considered admission due to patient's initial presentation, but after considering the examination and diagnostic results, patient will not require admission and can be discharged with outpatient follow-up.  Patient seen by and discussed with attending physician, Dr. Blinda Leatherwood, who agrees with  plan for follow-up with cardiology.  Final Clinical Impressions(s) / ED Diagnoses     ICD-10-CM   1. Palpitations  R00.2       ED Discharge Orders     None         Discharge Instructions Discussed with and Provided to Patient:     Discharge Instructions      Please return for new or worsening symptoms.  Otherwise, please follow-up with your cardiologist and your PCP.       Roxy Horseman, PA-C 01/20/24 4540    Gilda Crease, MD 01/20/24 [phone]

## 2024-03-21 ENCOUNTER — Encounter: Payer: Self-pay | Admitting: Cardiology

## 2024-03-21 ENCOUNTER — Ambulatory Visit: Payer: Medicare HMO | Attending: Cardiology | Admitting: Cardiology

## 2024-03-21 VITALS — BP 130/62 | HR 78 | Ht 72.0 in | Wt 181.4 lb

## 2024-03-21 DIAGNOSIS — R072 Precordial pain: Secondary | ICD-10-CM | POA: Diagnosis not present

## 2024-03-21 DIAGNOSIS — Z01810 Encounter for preprocedural cardiovascular examination: Secondary | ICD-10-CM

## 2024-03-21 DIAGNOSIS — E782 Mixed hyperlipidemia: Secondary | ICD-10-CM | POA: Insufficient documentation

## 2024-03-21 MED ORDER — METOPROLOL TARTRATE 100 MG PO TABS: ORAL_TABLET | ORAL | 0 refills | Status: DC

## 2024-03-21 NOTE — Patient Instructions (Addendum)
 Testing/Procedures: CORONARY CTA  Your physician has requested that you have cardiac CT. Cardiac computed tomography (CT) is a painless test that uses an x-ray machine to take clear, detailed pictures of your heart. For further information please visit [email or web address] Please follow instruction sheet as given.  Follow-Up: At [facility], you and your health needs are our priority.  As part of our continuing mission to provide you with exceptional heart care, we have created designated Provider Care Teams.  These Care Teams include your primary Cardiologist (physician) and Advanced Practice Providers (APPs -  Physician Assistants and Nurse Practitioners) who all work together to provide you with the care you need, when you need it.  Your next appointment:   As needed   Provider:   Elder Negus, MD     Other Instructions   Your cardiac CT will be scheduled at one of the below locations:   [facility] [address] [place], [place] [address] [phone]  please arrive at the [facility] and Children's Entrance (Entrance C2) of [facility] 30 minutes prior to test start time. You can use the FREE valet parking offered at entrance C (encouraged to control the heart rate for the test)  Proceed to the [facility] Radiology Department (first floor) to check-in and test prep.  All radiology patients and guests should use entrance C2 at [facility], accessed from [facility], even though the hospital's physical address listed is [address]    Please follow these instructions carefully (unless otherwise directed):  An IV will be required for this test and Nitroglycerin will be given.  Hold all erectile dysfunction medications at least 3 days (72 hrs) prior to test. (Ie viagra, cialis, sildenafil, tadalafil, etc)   On the Night Before the Test: Be sure to Drink plenty of water. Do not consume any  caffeinated/decaffeinated beverages or chocolate 12 hours prior to your test. Do not take any antihistamines 12 hours prior to your test.  On the Day of the Test: Drink plenty of water until 1 hour prior to the test. Do not eat any food 1 hour prior to test. You may take your regular medications prior to the test.  Take metoprolol (Lopressor) two hours prior to test. If you take Furosemide/Hydrochlorothiazide/Spironolactone/Chlorthalidone, please HOLD on the morning of the test. Patients who wear a continuous glucose monitor MUST remove the device prior to scanning.   After the Test: Drink plenty of water. After receiving IV contrast, you may experience a mild flushed feeling. This is normal. On occasion, you may experience a mild rash up to 24 hours after the test. This is not dangerous. If this occurs, you can take Benadryl 25 mg, Zyrtec, Claritin, or Allegra and increase your fluid intake. (Patients taking Tikosyn should avoid Benadryl, and may take Zyrtec, Claritin, or Allegra) If you experience trouble breathing, this can be serious. If it is severe call [phone] IMMEDIATELY. If it is mild, please call our office.  We will call to schedule your test 2-4 weeks out understanding that some insurance companies will need an authorization prior to the service being performed.   For more information and frequently asked questions, please visit our website : [email or web address]  For non-scheduling related questions, please contact the cardiac imaging nurse navigator should you have any questions/concerns: Cardiac Imaging Nurse Navigators Direct Office Dial: [phone]   For scheduling needs, including cancellations and rescheduling, please call [place], [phone].    1st Floor: - Lobby -  Registration  - Pharmacy  - Lab - Cafe  2nd Floor: - PV Lab - Diagnostic Testing (echo, CT, nuclear med)  3rd Floor: - Vacant  4th Floor: - TCTS (cardiothoracic surgery) - AFib  Clinic - Structural Heart Clinic - Vascular Surgery  - Vascular Ultrasound  5th Floor: - HeartCare Cardiology (general and EP) - Clinical Pharmacy for coumadin, hypertension, lipid, weight-loss medications, and med management appointments    Valet parking services will be available as well.

## 2024-03-21 NOTE — Progress Notes (Signed)
 Cardiology Office Note:  .   Date:  03/21/2024  ID:  Douglas Russell, DOB [birth year], MRN [id number] PCP: Shade Flood, MD  [facility] Providers Cardiologist:  Truett Mainland, MD PCP: Shade Flood, MD  Chief Complaint  Patient presents with   Chest Pain      History of Present Illness: .    Douglas Russell is a 73 y.o. male with hyperlipidemia, chest pain  Discussed the use of AI scribe software for clinical note transcription with the patient, who gave verbal consent to proceed.  History of Present Illness The patient, a 73 year old with a history of asthma, presents with an 'annoying' chest discomfort. 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 He also reports a tingling sensation in his fingers when he sleeps on his right side, favoring his left side for better circulation. The patient has a history of asthma and uses an Advair inhaler twice daily and as needed due to exposure to dust at work. He denies any history of smoking and reports no family history of heart disease. The patient's cholesterol was noted to be elevated at his last check-up, and he has been advised to lower it or start medication. He reports trying to maintain a healthy diet and is very active.     Vitals:   03/21/24 1355  BP: 130/62  Pulse: 78  SpO2: 97%     ROS:  Review of Systems  Cardiovascular:  Positive for chest pain (Currently resolved). Negative for dyspnea on exertion, leg swelling, palpitations and syncope.        Studies Reviewed: Marland Kitchen         EKG 01/19/2024: Sinus rhythm with 1st degree A-V block Otherwise normal ECG When compared with ECG of 16-Mar-2012 16:26, No significant change was found  Confirmed by Lorre Nick ([id number]) on 01/20/2024 12:36:14 PM    Independently  interpreted 12/2023: Chol 214, TG 71, HDL 799, LDL 120 Hb 13.9 Cr 0.87 Trop HS 5, 6 TSH 0.9   Physical Exam:   Physical Exam Vitals and nursing note reviewed.  Constitutional:      General: He is not in acute distress. Neck:     Vascular: No JVD.  Cardiovascular:     Rate and Rhythm: Normal rate and regular rhythm.     Heart sounds: Normal heart sounds. No murmur heard. Pulmonary:     Effort: Pulmonary effort is normal.     Breath sounds: Normal breath sounds. No wheezing or rales.  Musculoskeletal:     Right lower leg: No edema.     Left lower leg: No edema.      VISIT DIAGNOSES:   ICD-10-CM   1. Precordial pain  R07.2 EKG 12-Lead    2. Mixed hyperlipidemia  E78.2        ASSESSMENT AND PLAN: .    Douglas Russell is a 73 y.o. male with hyperlipidemia, chest pain     Assessment & Plan Chest pain: Intermittent chest pain likely musculoskeletal. Previous EKG unremarkable.  He does have elevated LDL.  Coronary CT angiogram preferred due to knee osteoarthritis and difficulty walking. - Order coronary CT angiogram to evaluate coronary arteries. -This should also help with restratification for management of elevated LDL.  As far as result, patient wants to avoid statin at this time.  Asthma Asthma managed with Advair inhaler. Shortness of breath likely due to dust exposure, improves with inhaler. - Continue current inhaler regimen with Advair.   Further recommendations to follow based on above testing.  No orders of the defined types were placed in this encounter.     Signed, Elder Negus, MD

## 2024-03-22 LAB — BASIC METABOLIC PANEL WITH GFR
BUN/Creatinine Ratio: 17 (ref 10–24)
BUN: 16 mg/dL (ref 8–27)
CO2: 25 mmol/L (ref 20–29)
Calcium: 9.5 mg/dL (ref 8.6–10.2)
Chloride: 103 mmol/L (ref 96–106)
Creatinine, Ser: 0.93 mg/dL (ref 0.76–1.27)
Glucose: 80 mg/dL (ref 70–99)
Potassium: 4.2 mmol/L (ref 3.5–5.2)
Sodium: 141 mmol/L (ref 134–144)
eGFR: 86 mL/min/1.73

## 2024-03-30 DIAGNOSIS — Z125 Encounter for screening for malignant neoplasm of prostate: Secondary | ICD-10-CM | POA: Diagnosis not present

## 2024-04-07 DIAGNOSIS — R351 Nocturia: Secondary | ICD-10-CM | POA: Diagnosis not present

## 2024-04-07 DIAGNOSIS — N401 Enlarged prostate with lower urinary tract symptoms: Secondary | ICD-10-CM | POA: Diagnosis not present

## 2024-04-18 ENCOUNTER — Encounter (HOSPITAL_COMMUNITY): Payer: Self-pay

## 2024-04-19 ENCOUNTER — Telehealth (HOSPITAL_COMMUNITY): Payer: Self-pay | Admitting: *Deleted

## 2024-04-19 NOTE — Telephone Encounter (Signed)
 Attempted to call patient regarding upcoming cardiac CT appointment. Left message on voicemail with name and callback number Johney Frame RN Navigator Cardiac Imaging [facility] [phone] Office

## 2024-04-20 ENCOUNTER — Ambulatory Visit (HOSPITAL_COMMUNITY)
Admission: RE | Admit: 2024-04-20 | Discharge: 2024-04-20 | Disposition: A | Discharge status: Home / Self Care | Source: Ambulatory Visit | Attending: Cardiology | Admitting: Cardiology

## 2024-04-20 DIAGNOSIS — R072 Precordial pain: Secondary | ICD-10-CM | POA: Insufficient documentation

## 2024-04-20 DIAGNOSIS — E782 Mixed hyperlipidemia: Secondary | ICD-10-CM | POA: Diagnosis not present

## 2024-04-20 MED ORDER — NITROGLYCERIN 0.4 MG SL SUBL
SUBLINGUAL_TABLET | SUBLINGUAL | Status: AC
  Filled 2024-04-20: qty 2

## 2024-04-20 MED ORDER — DILTIAZEM HCL 25 MG/5ML IV SOLN
INTRAVENOUS | Status: AC
  Filled 2024-04-20: qty 5

## 2024-04-20 MED ORDER — NITROGLYCERIN 0.4 MG SL SUBL
0.8000 mg | SUBLINGUAL_TABLET | Freq: Once | SUBLINGUAL | Status: AC
  Administered 2024-04-20: 0.8 mg via SUBLINGUAL

## 2024-04-20 MED ORDER — DILTIAZEM HCL 25 MG/5ML IV SOLN
10.0000 mg | INTRAVENOUS | Status: AC | PRN
  Administered 2024-04-20 (×2): 10 mg via INTRAVENOUS

## 2024-04-20 MED ORDER — IOHEXOL 350 MG/ML SOLN
100.0000 mL | Freq: Once | INTRAVENOUS | Status: AC | PRN
  Administered 2024-04-20: 100 mL via INTRAVENOUS

## 2024-04-22 ENCOUNTER — Encounter: Payer: Self-pay | Admitting: Cardiology

## 2024-04-22 NOTE — Progress Notes (Signed)
 Essentially normal coronary CT angiogram with coronary calcium score of 0.

## 2024-04-26 ENCOUNTER — Other Ambulatory Visit: Payer: Self-pay | Admitting: Family Medicine

## 2024-04-26 DIAGNOSIS — J309 Allergic rhinitis, unspecified: Secondary | ICD-10-CM

## 2024-04-27 DIAGNOSIS — H04123 Dry eye syndrome of bilateral lacrimal glands: Secondary | ICD-10-CM | POA: Diagnosis not present

## 2024-04-27 DIAGNOSIS — H401132 Primary open-angle glaucoma, bilateral, moderate stage: Secondary | ICD-10-CM | POA: Diagnosis not present

## 2024-04-27 DIAGNOSIS — H2513 Age-related nuclear cataract, bilateral: Secondary | ICD-10-CM | POA: Diagnosis not present

## 2024-05-11 ENCOUNTER — Ambulatory Visit (INDEPENDENT_AMBULATORY_CARE_PROVIDER_SITE_OTHER): Payer: Medicare (Managed Care) | Admitting: Family Medicine

## 2024-05-11 VITALS — BP 128/70 | HR 65 | Temp 98.6°F | Ht 72.0 in | Wt 176.2 lb

## 2024-05-11 DIAGNOSIS — R200 Anesthesia of skin: Secondary | ICD-10-CM | POA: Diagnosis not present

## 2024-05-11 DIAGNOSIS — R202 Paresthesia of skin: Secondary | ICD-10-CM

## 2024-05-11 DIAGNOSIS — J309 Allergic rhinitis, unspecified: Secondary | ICD-10-CM | POA: Diagnosis not present

## 2024-05-11 DIAGNOSIS — R351 Nocturia: Secondary | ICD-10-CM | POA: Diagnosis not present

## 2024-05-11 DIAGNOSIS — J4520 Mild intermittent asthma, uncomplicated: Secondary | ICD-10-CM

## 2024-05-11 MED ORDER — TAMSULOSIN HCL 0.4 MG PO CAPS: 0.4000 mg | ORAL_CAPSULE | Freq: Every day | ORAL | 3 refills | Status: AC

## 2024-05-11 MED ORDER — FLUTICASONE-SALMETEROL 250-50 MCG/ACT IN AEPB
1.0000 | INHALATION_SPRAY | Freq: Two times a day (BID) | RESPIRATORY_TRACT | 11 refills | Status: AC
Start: 2024-05-11 — End: ?

## 2024-05-11 NOTE — Patient Instructions (Signed)
 Thanks for coming in today.  Glad to hear that the hand tingling has improved.  Adjusting how you sleep and making sure that you are not sleeping on that arm or wrist may be helpful.  If any worsening tingling in the hand or wrist, especially with any daytime symptoms, let me know.  Based on the reassuring coronary calcium scoring test, I do not think we need to check any cholesterol levels today or any new medicines.  We can repeat those levels in November at a physical.  Keep up the good work with diet and staying active.   Glad to hear that the asthma is well-controlled, no change in meds for now and continue same medication for the nighttime urination symptoms as recommended by urology.  I will see you in 6 months but please let me know if there are any questions or new concerns in the interim.  Take care.

## 2024-05-11 NOTE — Progress Notes (Signed)
 Subjective:  Patient ID: Douglas Russell, male    DOB: August 25, 1951  Age: 73 y.o. MRN: [id number]  CC:  Chief Complaint  Patient presents with   Medical Management of Chronic Issues    Pt is doing well   Hand Problem    Pt notes intermittent hand numbness and tingling discussed last visit notes about the same     HPI Douglas Russell presents for   Right hand tingling Discussed that his his November visit.  Intermittent symptoms at that time for a week or 2.  No weakness or radicular symptoms throughout arm or neck pain.  Suspected carpal tunnel syndrome.  Over-the-counter wrist brace temporarily recommended with recheck in 4 to 6 weeks.  Last visit with me was in November.  Still having some intermittent hand symptoms and tingling only with sleeping on R side, resolves quickly, not during day - better than last visit.  Not having daytime sx's with active, repetitive job.   BPH with LUTS Previously followed by urology, treated with Flomax  for predominant symptom of nocturia.  Option of Myrbetriq, but Flomax  was effective at his November visit.  Has also avoided fluids prior to bedtime.  Stable symptoms.  Office visit with Dr. Freddi Jaeger on 04/07/2024, no changes. Flomax  working well.   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  Typically has been well controlled with use of Advair 250/50.  Flare off meds.  Cetirizine  10 mg for allergies and Flonase  as needed (not needed recently).  Stable at his November visit on Advair with albuterol  only rarely needed. No recent need for albuterol .   Hyperlipidemia: On medications.  Did undergo CT angiogram with coronary calcium score of 0 last month.  Minimal soft plaque.  Had been seen by cardiology in March for intermittent chest pain thought to be musculoskeletal. No current meds/statin given above results.  Diet/exercise approach. No recent chest pains. Weigh has improved - feels well - working 50hrs per week.  Staying active. Cut back to one egg in the morning,   Wt Readings from Last 3 Encounters:  05/11/24 176 lb 3.2 oz (79.9 kg)  03/21/24 181 lb 6.4 oz (82.3 kg)  01/19/24 185 lb (83.9 kg)    Lab Results  Component Value Date   CHOL 214 (H) 11/11/2023   HDL 79.50 11/11/2023   LDLCALC 120 (H) 11/11/2023   LDLDIRECT 113 (H) 05/11/2015   TRIG 71.0 11/11/2023   CHOLHDL 3 11/11/2023   Lab Results  Component Value Date   ALT 26 11/11/2023   AST 28 11/11/2023   ALKPHOS 79 11/11/2023   BILITOT 0.8 11/11/2023      History Patient Active Problem List   Diagnosis Date Noted   Precordial pain 03/21/2024   Mixed hyperlipidemia 03/21/2024   Hx of adenomatous polyp of colon 01/25/2015   Status post left hip replacement 10/13/2014   Asthma, chronic 10/13/2014   Osteoarthritis of left hip 10/10/2014   Hip arthritis 10/10/2014   History of asthma 08/08/2014   History of umbilical hernia 08/08/2014   History of pelvic fracture 08/08/2014   Past Medical History:  Diagnosis Date   Arthritis    L&R hip & pelvis    Asthma    has had since he was a child, states he only uses the inhaler PRN   Asthma    [employer]rees[employer] 10/09/2020   Cataract    Family history of adverse reaction to anesthesia    Pt. not exactly sure-sister"they  almost lost her"during  a surgery   Glaucoma    Glaucoma    both eyes   History of blood transfusion 1995   /w pelvic fx.    Hx of adenomatous polyp of colon 01/25/2015   Osteoarthritis of left hip 10/10/2014   Pelvic fracture (HCC) 09/28/1994   Pneumonia    in hosp[facility] ( /w pelvic fx)-    Past Surgical History:  Procedure Laterality Date   back cyst  12/29/2004   surgery x4 for cyst on the back    COLONOSCOPY  01/17/2015   Dr.Gessner   EYE SURGERY Bilateral 02/27/2015   Dr. Ben Bracken, [facility]   FRACTURE SURGERY Right 12/29/1993   hip   HERNIA REPAIR  12/29/2004   umbilical    INGUINAL HERNIA REPAIR Right 05/13/2019   Procedure: RIGHT INGUINAL HERNIA  REPAIR WITH MESH;  Surgeon: Oza Blumenthal, MD;  Location: [facility] OR;  Service: General;  Laterality: Right;  AND TAP BLOCK   JOINT REPLACEMENT N/A    [employer]reesia 10/09/2020   PELVIC FRACTURE SURGERY  12/29/1993   TOTAL HIP ARTHROPLASTY Left 10/10/2014   dr Agatha Horsfall   TOTAL HIP ARTHROPLASTY Left 10/10/2014   Procedure: TOTAL HIP ARTHROPLASTY;  Surgeon: Neville Barbone, MD;  Location: [facility] OR;  Service: Orthopedics;  Laterality: Left;   Allergies  Allergen Reactions   Other Hives and Shortness Of Breath   Shellfish Allergy Anaphylaxis and Swelling    Throat swells closed," like there is a block of cement in my throat"    Trazodone And Nefazodone     Jittery / shaking    Prior to Admission medications   Medication Sig Start Date End Date Taking? Authorizing Provider  albuterol  (VENTOLIN  HFA) 108 (90 Base) MCG/ACT inhaler Inhale 1-2 puffs into the lungs every 6 (six) hours as needed for wheezing or shortness of breath. Ok to fill with preferred pro air 02/20/21  Yes Benjiman Bras, MD  bimatoprost (LUMIGAN) 0.01 % SOLN Place 1 drop into both eyes at bedtime.    Yes [provider]  CINNAMON PO Take 1,000 mg by mouth daily.    Yes [provider]  EQ ALLERGY RELIEF, CETIRIZINE , 10 MG tablet Take 1 tablet by mouth once daily 04/26/24  Yes Benjiman Bras, MD  fluticasone -salmeterol (ADVAIR) 250-50 MCG/ACT AEPB Inhale 1 puff into the lungs in the morning and at bedtime. 05/13/23  Yes Benjiman Bras, MD  Garlic 1000 MG CAPS Take 1,000 mg by mouth daily.    Yes [provider]  Glucosamine-Chondroit-Vit C-Mn (GLUCOSAMINE 1500 COMPLEX PO) Take by mouth.   Yes [provider]  MAGNESIUM  PO Take by mouth.   Yes [provider]  Melatonin 1 MG TABS Take 1 mg by mouth at bedtime as needed (for sleep).    Yes [provider]  Multiple Vitamins-Minerals (CENTRUM SILVER 50+MEN PO) Take by mouth.   Yes [provider]  Omega 3-6-9 Fatty Acids  (OMEGA-3-6-9 PO) Take 2 capsules by mouth daily.   Yes [provider]  OVER THE COUNTER MEDICATION Place 1 Dose under the tongue 2 (two) times a day. CBD oil   Yes [provider]  POTASSIUM PO Take by mouth.   Yes [provider]  RHOPRESSA 0.02 % SOLN  11/22/19  Yes [provider]  tamsulosin  (FLOMAX ) 0.4 MG CAPS capsule Take 1 capsule (0.4 mg total) by mouth at bedtime. 05/13/23  Yes Benjiman Bras, MD  Turmeric Curcumin 500 MG CAPS Take  2 capsules by mouth daily.    Yes [provider]   Social History   Socioeconomic History   Marital status: Single    Spouse name: Not on file   Number of children: 1   Years of education: Not on file   Highest education level: Some college, no degree  Occupational History   Occupation: distribution    Employer: [employer]  Tobacco Use   Smoking status: Never   Smokeless tobacco: Never  Vaping Use   Vaping status: Never Used  Substance and Sexual Activity   Alcohol use: Yes    Alcohol/week: 2.0 standard drinks of alcohol    Types: 2 Glasses of wine per week    Comment: occasional glass of wine   Drug use: No    Comment: hemp oil under tongue at night   Sexual activity: Yes  Other Topics Concern   Not on file  Social History Narrative   Not on file   Social Drivers of Health   Financial Resource Strain: Low Risk  (05/11/2024)   Overall Financial Resource Strain (CARDIA)    Difficulty of Paying Living Expenses: Not hard at all  Food Insecurity: No Food Insecurity (05/11/2024)   Hunger Vital Sign    Worried About Running Out of Food in the Last Year: Never true    Ran Out of Food in the Last Year: Never true  Transportation Needs: No Transportation Needs (05/11/2024)   PRAPARE - Administrator, Civil Service (Medical): No    Lack of Transportation (Non-Medical): No  Physical Activity: Sufficiently Active (05/11/2024)   Exercise Vital Sign    Days of Exercise per Week:  5 days    Minutes of Exercise per Session: 150+ min  Stress: No Stress Concern Present (05/11/2024)   [employer] of Occupational Health - Occupational Stress Questionnaire    Feeling of Stress : Not at all  Social Connections: Moderately Integrated (05/11/2024)   Social Connection and Isolation Panel [NHANES]    Frequency of Communication with Friends and Family: More than three times a week    Frequency of Social Gatherings with Friends and Family: More than three times a week    Attends Religious Services: 1 to 4 times per year    Active Member of [employer] or Organizations: Yes    Attends Banker Meetings: 1 to 4 times per year    Marital Status: Divorced  Intimate Partner Violence: Not At Risk (08/20/2023)   Humiliation, Afraid, Rape, and Kick questionnaire    Fear of Current or Ex-Partner: No    Emotionally Abused: No    Physically Abused: No    Sexually Abused: No    Review of Systems  Constitutional:  Negative for fatigue and unexpected weight change.  Eyes:  Negative for visual disturbance.  Respiratory:  Negative for cough, chest tightness and shortness of breath.   Cardiovascular:  Negative for chest pain, palpitations and leg swelling.  Gastrointestinal:  Negative for abdominal pain and blood in stool.  Neurological:  Negative for dizziness, light-headedness and headaches.     Objective:   Vitals:   05/11/24 0945  BP: 128/70  Pulse: 65  Temp: 98.6 F (37 C)  TempSrc: Temporal  SpO2: 98%  Weight: 176 lb 3.2 oz (79.9 kg)  Height: 6' (1.829 m)     Physical Exam Vitals reviewed.  Constitutional:      Appearance: He is well-developed.  HENT:     Head: Normocephalic and atraumatic.  Neck:     Vascular: No carotid bruit or JVD.  Cardiovascular:     Rate and Rhythm: Normal rate and regular rhythm.     Heart sounds: Normal heart sounds. No murmur heard. Pulmonary:     Effort: Pulmonary effort is normal.     Breath sounds: Normal breath sounds.  No rales.  Musculoskeletal:     Right lower leg: No edema.     Left lower leg: No edema.     Comments: Right hand/wrist, full range of motion, nontender, neurovascular intact distally.  Negative Tinel's and Phalen testing  Skin:    General: Skin is warm and dry.  Neurological:     Mental Status: He is alert and oriented to person, place, and time.  Psychiatric:        Mood and Affect: Mood normal.        Assessment & Plan:  Douglas Russell is a 73 y.o. male . Allergic rhinitis, unspecified seasonality, unspecified trigger  - Stable with current med regimen with option of Flonase  if needed for breakthrough symptoms  Mild intermittent asthma without complication - Plan: fluticasone -salmeterol (ADVAIR) 250-50 MCG/ACT AEPB  - Stable with Advair, has albuterol  if needed, no recent use, continue same dose Advair for now with RTC precautions  Numbness and tingling in right hand  - Suspected mild carpal tunnel syndrome at his last visit but symptoms have improved and reassuring exam at this time.  Also he is not experiencing symptoms during the day, or with activity.  Will continue to monitor at this time, avoid sleeping on that arm at night in case this is more of a compressive neuropathy.  Advised to follow-up if more symptomatic.  Nocturia - Plan: tamsulosin  (FLOMAX ) 0.4 MG CAPS capsule Stable with Flomax , continue same.  Recent urology visit as above.  Meds ordered this encounter  Medications   fluticasone -salmeterol (ADVAIR) 250-50 MCG/ACT AEPB    Sig: Inhale 1 puff into the lungs in the morning and at bedtime.    Dispense:  60 each    Refill:  11   tamsulosin  (FLOMAX ) 0.4 MG CAPS capsule    Sig: Take 1 capsule (0.4 mg total) by mouth at bedtime.    Dispense:  90 capsule    Refill:  3   Patient Instructions  Thanks for coming in today.  Glad to hear that the hand tingling has improved.  Adjusting how you sleep and making sure that you are not sleeping on that arm or wrist may  be helpful.  If any worsening tingling in the hand or wrist, especially with any daytime symptoms, let me know.  Based on the reassuring coronary calcium scoring test, I do not think we need to check any cholesterol levels today or any new medicines.  We can repeat those levels in November at a physical.  Keep up the good work with diet and staying active.   Glad to hear that the asthma is well-controlled, no change in meds for now and continue same medication for the nighttime urination symptoms as recommended by urology.  I will see you in 6 months but please let me know if there are any questions or new concerns in the interim.  Take care.     Signed,   Caro Christmas, MD [facility], [facility] 05/11/24 10:10 AM

## 2024-07-21 ENCOUNTER — Other Ambulatory Visit: Payer: Self-pay | Admitting: Family Medicine

## 2024-07-21 DIAGNOSIS — J309 Allergic rhinitis, unspecified: Secondary | ICD-10-CM

## 2024-07-27 ENCOUNTER — Other Ambulatory Visit: Payer: Self-pay | Admitting: Family Medicine

## 2024-07-27 DIAGNOSIS — J309 Allergic rhinitis, unspecified: Secondary | ICD-10-CM

## 2024-10-12 ENCOUNTER — Encounter: Payer: Self-pay | Admitting: Family Medicine

## 2024-10-13 ENCOUNTER — Encounter: Payer: Self-pay | Admitting: Family Medicine

## 2024-10-13 ENCOUNTER — Ambulatory Visit (INDEPENDENT_AMBULATORY_CARE_PROVIDER_SITE_OTHER): Payer: Medicare (Managed Care) | Admitting: Family Medicine

## 2024-10-13 VITALS — BP 138/88 | HR 69 | Temp 98.2°F | Resp 19 | Ht 72.0 in | Wt 175.0 lb

## 2024-10-13 DIAGNOSIS — K5900 Constipation, unspecified: Secondary | ICD-10-CM

## 2024-10-13 DIAGNOSIS — J309 Allergic rhinitis, unspecified: Secondary | ICD-10-CM

## 2024-10-13 DIAGNOSIS — R351 Nocturia: Secondary | ICD-10-CM | POA: Diagnosis not present

## 2024-10-13 DIAGNOSIS — E785 Hyperlipidemia, unspecified: Secondary | ICD-10-CM | POA: Diagnosis not present

## 2024-10-13 DIAGNOSIS — J4520 Mild intermittent asthma, uncomplicated: Secondary | ICD-10-CM

## 2024-10-13 LAB — COMPREHENSIVE METABOLIC PANEL WITH GFR
ALT: 23 U/L (ref 0–53)
AST: 25 U/L (ref 0–37)
Albumin: 4.2 g/dL (ref 3.5–5.2)
Alkaline Phosphatase: 85 U/L (ref 39–117)
BUN: 17 mg/dL (ref 6–23)
CO2: 30 meq/L (ref 19–32)
Calcium: 9.3 mg/dL (ref 8.4–10.5)
Chloride: 104 meq/L (ref 96–112)
Creatinine, Ser: 0.92 mg/dL (ref 0.40–1.50)
GFR: 81.24 mL/min (ref >60.00)
Glucose, Bld: 81 mg/dL (ref 70–99)
Potassium: 4.2 meq/L (ref 3.5–5.1)
Sodium: 140 meq/L (ref 135–145)
Total Bilirubin: 0.7 mg/dL (ref 0.2–1.2)
Total Protein: 7.4 g/dL (ref 6.0–8.3)

## 2024-10-13 LAB — LIPID PANEL
Cholesterol: 201 mg/dL — ABNORMAL HIGH (ref 0–200)
HDL: 80.8 mg/dL (ref >39.00)
LDL Cholesterol: 106 mg/dL — ABNORMAL HIGH (ref 0–99)
NonHDL: 120.69
Total CHOL/HDL Ratio: 2
Triglycerides: 74 mg/dL (ref 0.0–149.0)
VLDL: 14.8 mg/dL (ref 0.0–40.0)

## 2024-10-13 LAB — TSH: TSH: 1.17 u[IU]/mL (ref 0.35–5.50)

## 2024-10-13 MED ORDER — ALBUTEROL SULFATE HFA 108 (90 BASE) MCG/ACT IN AERS
1.0000 | INHALATION_SPRAY | Freq: Four times a day (QID) | RESPIRATORY_TRACT | 1 refills | Status: AC | PRN
Start: 2024-10-13 — End: ?

## 2024-10-13 NOTE — Patient Instructions (Addendum)
 Thank you for coming in today. No change in medications at this time. If there are any concerns on your bloodwork, I will let you know.  Okay to continue probiotic for constipation, but see information below.  Make sure to drink plenty of fluids throughout the day, fiber in the diet can be helpful.  If you do have continued problems with constipation MiraLAX  over-the-counter temporarily is fine to use.  Follow-up if that does not improve or any new/worsening symptoms.  I did refill the albuterol  for asthma but no other changes for now.  If you require albuterol  more than twice per week, or any other worsening asthma symptoms let me know and we can adjust her meds.  If refills needed let me know, otherwise I will see you in 6 months. Take care!  Constipation, Adult Constipation is when a person has fewer than three bowel movements in a week, has difficulty having a bowel movement, or has stools (feces) that are dry, hard, or larger than normal. Constipation may be caused by an underlying condition. It may become worse with age if a person takes certain medicines and does not take in enough fluids. Follow these instructions at home: Eating and drinking  Eat foods that have a lot of fiber, such as beans, whole grains, and fresh fruits and vegetables. Limit foods that are low in fiber and high in fat and processed sugars, such as fried or sweet foods. These include french fries, hamburgers, cookies, candies, and soda. Drink enough fluid to keep your urine pale yellow. General instructions Exercise regularly or as told by your health care provider. Try to do 150 minutes of moderate exercise each week. Use the bathroom when you have the urge to go. Do not hold it in. Take over-the-counter and prescription medicines only as told by your health care provider. This includes any fiber supplements. During bowel movements: Practice deep breathing while relaxing the lower abdomen. Practice pelvic floor  relaxation. Watch your condition for any changes. Let your health care provider know about them. Keep all follow-up visits as told by your health care provider. This is important. Contact a health care provider if: You have pain that gets worse. You have a fever. You do not have a bowel movement after 4 days. You vomit. You are not hungry or you lose weight. You are bleeding from the opening between the buttocks (anus). You have thin, pencil-like stools. Get help right away if: You have a fever and your symptoms suddenly get worse. You leak stool or have blood in your stool. Your abdomen is bloated. You have severe pain in your abdomen. You feel dizzy or you faint. Summary Constipation is when a person has fewer than three bowel movements in a week, has difficulty having a bowel movement, or has stools (feces) that are dry, hard, or larger than normal. Eat foods that have a lot of fiber, such as beans, whole grains, and fresh fruits and vegetables. Drink enough fluid to keep your urine pale yellow. Take over-the-counter and prescription medicines only as told by your health care provider. This includes any fiber supplements. This information is not intended to replace advice given to you by your health care provider. Make sure you discuss any questions you have with your health care provider. Document Revised: 10/29/2022 Document Reviewed: 10/29/2022 Elsevier Patient Education  2024 [employer].

## 2024-10-13 NOTE — Progress Notes (Signed)
 Subjective:  Patient ID: Douglas Russell, male    DOB: 1951/07/05  Age: 73 y.o. MRN: [id number]  CC:  Chief Complaint  Patient presents with   Follow-up    6 month follow up.    Constipation    Sx started 1 month ago. Thinks its from his diet. Will start probiotics today. Some blood in stool when straining.     HPI Douglas Russell presents for   Mild intermittent asthma Last discussed in May.  Advair 250/50 with flares of meds in the past, and cetirizine , Flonase  as needed for allergies.  Albuterol  as needed. Dusty environment, not able to use mask. Up and down control. Albuterol  only up to once per week. Cetirizine  daily, not needing flonase .    BPH with LUTS Treated with Flomax  for predominant symptom of nocturia.  Option of Myrbetriq previously but has been stable on Flomax .  Has been followed by urology in April, Dr. Selma, no changes. Still working well. No dizziness or side effects.    Hyperlipidemia: No current meds. CCS score of 0 earlier this year.  No recent chest pains, feeling well.  Lab Results  Component Value Date   CHOL 214 (H) 11/11/2023   HDL 79.50 11/11/2023   LDLCALC 120 (H) 11/11/2023   LDLDIRECT 113 (H) 05/11/2015   TRIG 71.0 11/11/2023   CHOLHDL 3 11/11/2023   Lab Results  Component Value Date   ALT 26 11/11/2023   AST 28 11/11/2023   ALKPHOS 79 11/11/2023   BILITOT 0.8 11/11/2023   Constipation: Off and on past few months. Started probiotic yesterday - feels like better. Straining at times, few times with BRBPR at times of straining only. Not every time.  No abd pain.  Colonoscopy 08/2022 - few diminutive polyps.    History Patient Active Problem List   Diagnosis Date Noted   Precordial pain 03/21/2024   Mixed hyperlipidemia 03/21/2024   Hx of adenomatous polyp of colon 01/25/2015   Status post left hip replacement 10/13/2014   Asthma, chronic 10/13/2014   Osteoarthritis of left hip 10/10/2014   Hip arthritis 10/10/2014   History of  asthma 08/08/2014   History of umbilical hernia 08/08/2014   History of pelvic fracture 08/08/2014   Past Medical History:  Diagnosis Date   Arthritis    L&R hip & pelvis    Asthma    has had since he was a child, states he only uses the inhaler PRN   Asthma 12/1958   Phreesia 10/09/2020   Cataract    Family history of adverse reaction to anesthesia    Pt. not exactly sure-sisterthey almost lost herduring  a surgery   Glaucoma    Glaucoma    both eyes   History of blood transfusion 1995   /w pelvic fx.    Hx of adenomatous polyp of colon 01/25/2015   Osteoarthritis of left hip 10/10/2014   Pelvic fracture (HCC) 09/28/1994   Pneumonia    in hosp[facility] ( /w pelvic fx)-    Past Surgical History:  Procedure Laterality Date   back cyst  12/29/2004   surgery x4 for cyst on the back    COLONOSCOPY  01/17/2015   Dr.Gessner   EYE SURGERY Bilateral 02/27/2015   Dr. Gaither Quan, [place]   FRACTURE SURGERY Right 12/29/1993   hip   HERNIA REPAIR  12/29/2004   umbilical    INGUINAL HERNIA REPAIR Right 05/13/2019   Procedure: RIGHT INGUINAL HERNIA REPAIR WITH MESH;  Surgeon: Vernetta Berg,  MD;  Location: [facility] OR;  Service: General;  Laterality: Right;  AND TAP BLOCK   JOINT REPLACEMENT N/A    [employer]reesia 10/09/2020   PELVIC FRACTURE SURGERY  12/29/1993   TOTAL HIP ARTHROPLASTY Left 10/10/2014   dr josefina   TOTAL HIP ARTHROPLASTY Left 10/10/2014   Procedure: TOTAL HIP ARTHROPLASTY;  Surgeon: Fonda SHAUNNA josefina, MD;  Location: [facility] OR;  Service: Orthopedics;  Laterality: Left;   Allergies  Allergen Reactions   Other Hives and Shortness Of Breath   Shellfish Allergy Anaphylaxis and Swelling    Throat swells closed, like there is a block of cement in my throat    Trazodone And Nefazodone     Jittery / shaking    Prior to Admission medications   Medication Sig Start Date End Date Taking? Authorizing Provider  albuterol  (VENTOLIN  HFA) 108 (90 Base) MCG/ACT inhaler Inhale 1-2 puffs into  the lungs every 6 (six) hours as needed for wheezing or shortness of breath. Ok to fill with preferred pro air 02/20/21  Yes Levora Reyes SAUNDERS, MD  bimatoprost (LUMIGAN) 0.01 % SOLN Place 1 drop into both eyes at bedtime.    Yes [provider]  CINNAMON PO Take 1,000 mg by mouth daily.    Yes [provider]  EQ ALLERGY RELIEF, CETIRIZINE , 10 MG tablet Take 1 tablet by mouth once daily 07/21/24  Yes Levora Reyes SAUNDERS, MD  fluticasone -salmeterol (ADVAIR) 250-50 MCG/ACT AEPB Inhale 1 puff into the lungs in the morning and at bedtime. 05/11/24  Yes Levora Reyes SAUNDERS, MD  Garlic 1000 MG CAPS Take 1,000 mg by mouth daily.    Yes [provider]  Glucosamine-Chondroit-Vit C-Mn (GLUCOSAMINE 1500 COMPLEX PO) Take by mouth.   Yes [provider]  MAGNESIUM  PO Take by mouth.   Yes [provider]  Melatonin 1 MG TABS Take 1 mg by mouth at bedtime as needed (for sleep).    Yes [provider]  Multiple Vitamins-Minerals (CENTRUM SILVER 50+MEN PO) Take by mouth.   Yes [provider]  Omega 3-6-9 Fatty Acids (OMEGA-3-6-9 PO) Take 2 capsules by mouth daily.   Yes [provider]  OVER THE COUNTER MEDICATION Place 1 Dose under the tongue 2 (two) times a day. CBD oil   Yes [provider]  POTASSIUM PO Take by mouth.   Yes [provider]  Probiotic Product (PROBIOTIC BLEND PO) Take by mouth.   Yes [provider]  RHOPRESSA 0.02 % SOLN  11/22/19  Yes [provider]  SIMBRINZA 1-0.2 % SUSP Apply 1 drop to eye 3 (three) times daily. 06/17/24  Yes [provider]  tamsulosin  (FLOMAX ) 0.4 MG CAPS capsule Take 1 capsule (0.4 mg total) by mouth at bedtime. 05/11/24  Yes Levora Reyes SAUNDERS, MD  Turmeric Curcumin 500 MG CAPS Take 2 capsules by mouth daily.    Yes [provider]   Social History   Socioeconomic History   Marital status: Single    Spouse name: Not on file   Number of children: 1    Years of education: Not on file   Highest education level: Some college, no degree  Occupational History   Occupation: distribution    Employer: [employer]  Tobacco Use   Smoking status: Never   Smokeless tobacco: Never  Vaping Use   Vaping status: Never Used  Substance and Sexual Activity   Alcohol use: Yes    Alcohol/week: 2.0 standard drinks of alcohol    Types:  2 Glasses of wine per week    Comment: occasional glass of wine   Drug use: No    Comment: hemp oil under tongue at night   Sexual activity: Yes  Other Topics Concern   Not on file  Social History Narrative   Not on file   Social Drivers of Health   Financial Resource Strain: Low Risk  (10/13/2024)   Overall Financial Resource Strain (CARDIA)    Difficulty of Paying Living Expenses: Not hard at all  Food Insecurity: No Food Insecurity (10/13/2024)   Hunger Vital Sign    Worried About Running Out of Food in the Last Year: Never true    Ran Out of Food in the Last Year: Never true  Transportation Needs: No Transportation Needs (10/13/2024)   PRAPARE - Administrator, Civil Service (Medical): No    Lack of Transportation (Non-Medical): No  Physical Activity: Sufficiently Active (10/13/2024)   Exercise Vital Sign    Days of Exercise per Week: 5 days    Minutes of Exercise per Session: 30 min  Stress: No Stress Concern Present (10/13/2024)   [employer] of Occupational Health - Occupational Stress Questionnaire    Feeling of Stress: Not at all  Social Connections: Unknown (10/13/2024)   Social Connection and Isolation Panel    Frequency of Communication with Friends and Family: More than three times a week    Frequency of Social Gatherings with Friends and Family: More than three times a week    Attends Religious Services: More than 4 times per year    Active Member of [employer] or Organizations: Patient declined    Attends Banker Meetings: Not on file    Marital Status:  Divorced  Intimate Partner Violence: Not At Risk (08/20/2023)   Humiliation, Afraid, Rape, and Kick questionnaire    Fear of Current or Ex-Partner: No    Emotionally Abused: No    Physically Abused: No    Sexually Abused: No    Review of Systems Per hpi.   Objective:   Vitals:   10/13/24 1010  BP: 138/88  Pulse: 69  Resp: 19  Temp: 98.2 F (36.8 C)  TempSrc: Temporal  SpO2: 99%  Weight: 175 lb (79.4 kg)  Height: 6' (1.829 m)     Physical Exam Vitals reviewed.  Constitutional:      Appearance: He is well-developed.  HENT:     Head: Normocephalic and atraumatic.  Neck:     Vascular: No carotid bruit or JVD.  Cardiovascular:     Rate and Rhythm: Normal rate and regular rhythm.     Heart sounds: Normal heart sounds. No murmur heard. Pulmonary:     Effort: Pulmonary effort is normal.     Breath sounds: Normal breath sounds. No rales.  Abdominal:     General: Abdomen is flat. There is no distension.     Tenderness: There is no abdominal tenderness. There is no guarding.  Musculoskeletal:     Right lower leg: No edema.     Left lower leg: No edema.  Skin:    General: Skin is warm and dry.  Neurological:     Mental Status: He is alert and oriented to person, place, and time.  Psychiatric:        Mood and Affect: Mood normal.      Assessment & Plan:  Douglas Russell is a 73 y.o. male . Allergic rhinitis, unspecified seasonality, unspecified trigger Mild intermittent asthma without complication -  Plan: albuterol  (VENTOLIN  HFA) 108 (90 Base) MCG/ACT inhaler  - Overall stable.  Option of additional medication or adjusting meds if frequent need for albuterol , new prescription given.  Continue Advair same dose for now with RTC precautions.  Ideally dust avoidance with mask if possible.  BPH with Nocturia  - Stable with Flomax , continue same  Hyperlipidemia, unspecified hyperlipidemia type - Plan: Comprehensive metabolic panel with GFR, Lipid panel  - Check  updated labs but no new meds for now given reassuring coronary calcium scoring previously   Constipation, unspecified constipation type - Plan: TSH  - Recent symptoms.  He has noticed some improvement with use of over-the-counter probiotic.  Will check TSH, discussed fiber and fluids in the diet, MiraLAX  if needed with RTC precautions.  Colonoscopy in 2023 noted.  Meds ordered this encounter  Medications   albuterol  (VENTOLIN  HFA) 108 (90 Base) MCG/ACT inhaler    Sig: Inhale 1-2 puffs into the lungs every 6 (six) hours as needed for wheezing or shortness of breath. Ok to fill with preferred pro air    Dispense:  18 g    Refill:  1   Patient Instructions  Thank you for coming in today. No change in medications at this time. If there are any concerns on your bloodwork, I will let you know.  Okay to continue probiotic for constipation, but see information below.  Make sure to drink plenty of fluids throughout the day, fiber in the diet can be helpful.  If you do have continued problems with constipation MiraLAX  over-the-counter temporarily is fine to use.  Follow-up if that does not improve or any new/worsening symptoms.  I did refill the albuterol  for asthma but no other changes for now.  If you require albuterol  more than twice per week, or any other worsening asthma symptoms let me know and we can adjust her meds.  If refills needed let me know, otherwise I will see you in 6 months. Take care!  Constipation, Adult Constipation is when a person has fewer than three bowel movements in a week, has difficulty having a bowel movement, or has stools (feces) that are dry, hard, or larger than normal. Constipation may be caused by an underlying condition. It may become worse with age if a person takes certain medicines and does not take in enough fluids. Follow these instructions at home: Eating and drinking  Eat foods that have a lot of fiber, such as beans, whole grains, and fresh fruits and  vegetables. Limit foods that are low in fiber and high in fat and processed sugars, such as fried or sweet foods. These include french fries, hamburgers, cookies, candies, and soda. Drink enough fluid to keep your urine pale yellow. General instructions Exercise regularly or as told by your health care provider. Try to do 150 minutes of moderate exercise each week. Use the bathroom when you have the urge to go. Do not hold it in. Take over-the-counter and prescription medicines only as told by your health care provider. This includes any fiber supplements. During bowel movements: Practice deep breathing while relaxing the lower abdomen. Practice pelvic floor relaxation. Watch your condition for any changes. Let your health care provider know about them. Keep all follow-up visits as told by your health care provider. This is important. Contact a health care provider if: You have pain that gets worse. You have a fever. You do not have a bowel movement after 4 days. You vomit. You are not hungry or you lose weight.  You are bleeding from the opening between the buttocks (anus). You have thin, pencil-like stools. Get help right away if: You have a fever and your symptoms suddenly get worse. You leak stool or have blood in your stool. Your abdomen is bloated. You have severe pain in your abdomen. You feel dizzy or you faint. Summary Constipation is when a person has fewer than three bowel movements in a week, has difficulty having a bowel movement, or has stools (feces) that are dry, hard, or larger than normal. Eat foods that have a lot of fiber, such as beans, whole grains, and fresh fruits and vegetables. Drink enough fluid to keep your urine pale yellow. Take over-the-counter and prescription medicines only as told by your health care provider. This includes any fiber supplements. This information is not intended to replace advice given to you by your health care provider. Make sure you  discuss any questions you have with your health care provider. Document Revised: 10/29/2022 Document Reviewed: 10/29/2022 Elsevier Patient Education  2024 Elsevier Inc.     Signed,   Reyes [place], MD [facility], [facility] 10/13/24 10:47 AM

## 2024-10-19 ENCOUNTER — Ambulatory Visit: Payer: Self-pay | Admitting: Family Medicine

## 2024-10-21 ENCOUNTER — Other Ambulatory Visit: Payer: Self-pay | Admitting: Family Medicine

## 2024-10-21 DIAGNOSIS — J309 Allergic rhinitis, unspecified: Secondary | ICD-10-CM

## 2024-11-17 ENCOUNTER — Encounter: Payer: Medicare (Managed Care) | Admitting: Family Medicine

## 2025-01-18 ENCOUNTER — Other Ambulatory Visit: Payer: Self-pay | Admitting: Family Medicine

## 2025-01-18 DIAGNOSIS — J309 Allergic rhinitis, unspecified: Secondary | ICD-10-CM

## 2025-04-13 ENCOUNTER — Encounter: Payer: Medicare (Managed Care) | Admitting: Family Medicine
